# Patient Record
Sex: Female | Born: 1995
Health system: Southern US, Community
[De-identification: ages and names within clinical notes are randomized; demographics above are authoritative.]

## PROBLEM LIST (undated history)

## (undated) DIAGNOSIS — O09299 Supervision of pregnancy with other poor reproductive or obstetric history, unspecified trimester: Secondary | ICD-10-CM

## (undated) DIAGNOSIS — D649 Anemia, unspecified: Secondary | ICD-10-CM

## (undated) DIAGNOSIS — F419 Anxiety disorder, unspecified: Secondary | ICD-10-CM

## (undated) DIAGNOSIS — N390 Urinary tract infection, site not specified: Secondary | ICD-10-CM

## (undated) DIAGNOSIS — F99 Mental disorder, not otherwise specified: Secondary | ICD-10-CM

## (undated) DIAGNOSIS — F32A Depression, unspecified: Secondary | ICD-10-CM

## (undated) DIAGNOSIS — O24419 Gestational diabetes mellitus in pregnancy, unspecified control: Secondary | ICD-10-CM

## (undated) DIAGNOSIS — E119 Type 2 diabetes mellitus without complications: Secondary | ICD-10-CM

## (undated) HISTORY — DX: Supervision of pregnancy with other poor reproductive or obstetric history, unspecified trimester: O09.299

## (undated) HISTORY — DX: Gestational diabetes mellitus in pregnancy, unspecified control: O24.419

## (undated) HISTORY — PX: MOUTH SURGERY: SHX715

## (undated) HISTORY — DX: Mental disorder, not otherwise specified: F99

---

## 2016-10-03 ENCOUNTER — Ambulatory Visit
Admission: RE | Admit: 2016-10-03 | Discharge: 2016-10-03 | Disposition: A | Discharge status: Home / Self Care | Payer: 59 | Source: Ambulatory Visit | Attending: Family Medicine | Admitting: Family Medicine

## 2016-10-03 ENCOUNTER — Other Ambulatory Visit: Payer: Self-pay | Admitting: Family Medicine

## 2016-10-03 DIAGNOSIS — R7611 Nonspecific reaction to tuberculin skin test without active tuberculosis: Secondary | ICD-10-CM

## 2017-03-13 ENCOUNTER — Other Ambulatory Visit: Payer: Self-pay

## 2017-03-13 ENCOUNTER — Ambulatory Visit (HOSPITAL_COMMUNITY)
Admission: EM | Admit: 2017-03-13 | Discharge: 2017-03-13 | Disposition: A | Discharge status: Home / Self Care | Payer: 59 | Attending: Urgent Care | Admitting: Urgent Care

## 2017-03-13 ENCOUNTER — Encounter (HOSPITAL_COMMUNITY): Payer: Self-pay | Admitting: Emergency Medicine

## 2017-03-13 DIAGNOSIS — J0100 Acute maxillary sinusitis, unspecified: Secondary | ICD-10-CM | POA: Diagnosis not present

## 2017-03-13 DIAGNOSIS — R059 Cough, unspecified: Secondary | ICD-10-CM

## 2017-03-13 DIAGNOSIS — R05 Cough: Secondary | ICD-10-CM

## 2017-03-13 DIAGNOSIS — J029 Acute pharyngitis, unspecified: Secondary | ICD-10-CM

## 2017-03-13 MED ORDER — AMOXICILLIN 500 MG PO CAPS: 500.0000 mg | ORAL_CAPSULE | Freq: Three times a day (TID) | ORAL | 0 refills | Status: DC

## 2017-03-13 MED ORDER — CETIRIZINE HCL 10 MG PO TABS: 10.0000 mg | ORAL_TABLET | Freq: Every day | ORAL | 11 refills | Status: DC

## 2017-03-13 MED ORDER — BENZONATATE 100 MG PO CAPS: 100.0000 mg | ORAL_CAPSULE | Freq: Three times a day (TID) | ORAL | 0 refills | Status: DC | PRN

## 2017-03-13 MED ORDER — HYDROCODONE-HOMATROPINE 5-1.5 MG/5ML PO SYRP: 5.0000 mL | ORAL_SOLUTION | Freq: Every evening | ORAL | 0 refills | Status: DC | PRN

## 2017-03-13 MED ORDER — PSEUDOEPHEDRINE HCL ER 120 MG PO TB12: 120.0000 mg | ORAL_TABLET | Freq: Two times a day (BID) | ORAL | 3 refills | Status: DC

## 2017-03-13 NOTE — ED Provider Notes (Signed)
MRN: [id number] DOB: 25-May-1995  Subjective:   Donna Mcknight is a 21 y.o. female presenting for 3 week history of productive cough without hemoptysis that elicits shob, mild wheezing. Has also had sore throat, sinus congestion, now having right sided facial pressure, throbbing behind her right eye. Denies fever, chest pain, ear pain, ear drainage, chest pain, n/v, abdominal pain, rashes. Has been using DayQuil and NyQuil, otc antihistamine with minimal relief. Denies history of asthma, denies smoking cigarettes.  Donna Mcknight is not currently taking any medications and has No Known Allergies. [name] denies past medical history. She  has a past surgical history that includes Mouth surgery.  Objective:   Vitals: BP 113/74 (BP Location: Right Arm)   Pulse 73   Temp 99 F (37.2 C) (Oral)   Resp 20   LMP 02/11/2017   SpO2 96%   Physical Exam  Constitutional: She is oriented to person, place, and time. She appears well-developed and well-nourished.  HENT:  TM's intact bilaterally, no effusions or erythema. Nasal turbinates pink and moist, nasal passages patent. Mild right sided sinus tenderness. Oropharynx clear, mucous membranes moist.  Eyes: Right eye exhibits no discharge. Left eye exhibits no discharge. Right conjunctiva is injected.  Neck: Normal range of motion. Neck supple.  Cardiovascular: Normal rate, regular rhythm and intact distal pulses. Exam reveals no gallop and no friction rub.  No murmur heard. Pulmonary/Chest: No respiratory distress. She has no wheezes. She has no rales.  Lymphadenopathy:    She has no cervical adenopathy.  Neurological: She is alert and oriented to person, place, and time.  Skin: Skin is warm and dry.   Assessment and Plan :   Acute non-recurrent maxillary sinusitis  Sore throat  Cough  Will cover for sinusitis with amoxicillin. Cough suppression medications recommended otherwise. Use Zyrtec and Sudafed to address allergy component given that  patient just moved from [place] to [place] in the past 6 months and has felt more congestion, sneezing since being here. Return-to-clinic precautions discussed, patient verbalized understanding.   Wallis Bamberg, PA-C [facility] [phone] 03/13/2017 4:09 PM   Wallis Bamberg, PA-C 03/13/17 1630

## 2017-03-13 NOTE — Discharge Instructions (Signed)
For sore throat try using a honey-based tea. Use 3 teaspoons of honey with juice squeezed from half lemon. Place shaved pieces of ginger into 1/2-1 cup of water and warm over stove top. Then mix the ingredients and repeat every 4 hours as needed. 

## 2017-03-13 NOTE — ED Triage Notes (Signed)
Cold symptoms first noticed 3 weeks ago, then went away for 3-4 days.  Now symptoms have returned.  Patient complains of cough, throat soreness.  Denies pain.  Pain behind right eye started today.

## 2018-05-05 IMAGING — CR DG CHEST 2V
2 series · 2 of 2 positions shown · non-contrast
Comparison: None.

CLINICAL DATA: Pos TB blood test / no chest complaints / non smoker
/ no chance PG / jdh 315

EXAM:
CHEST  2 VIEW

[w chest pa]
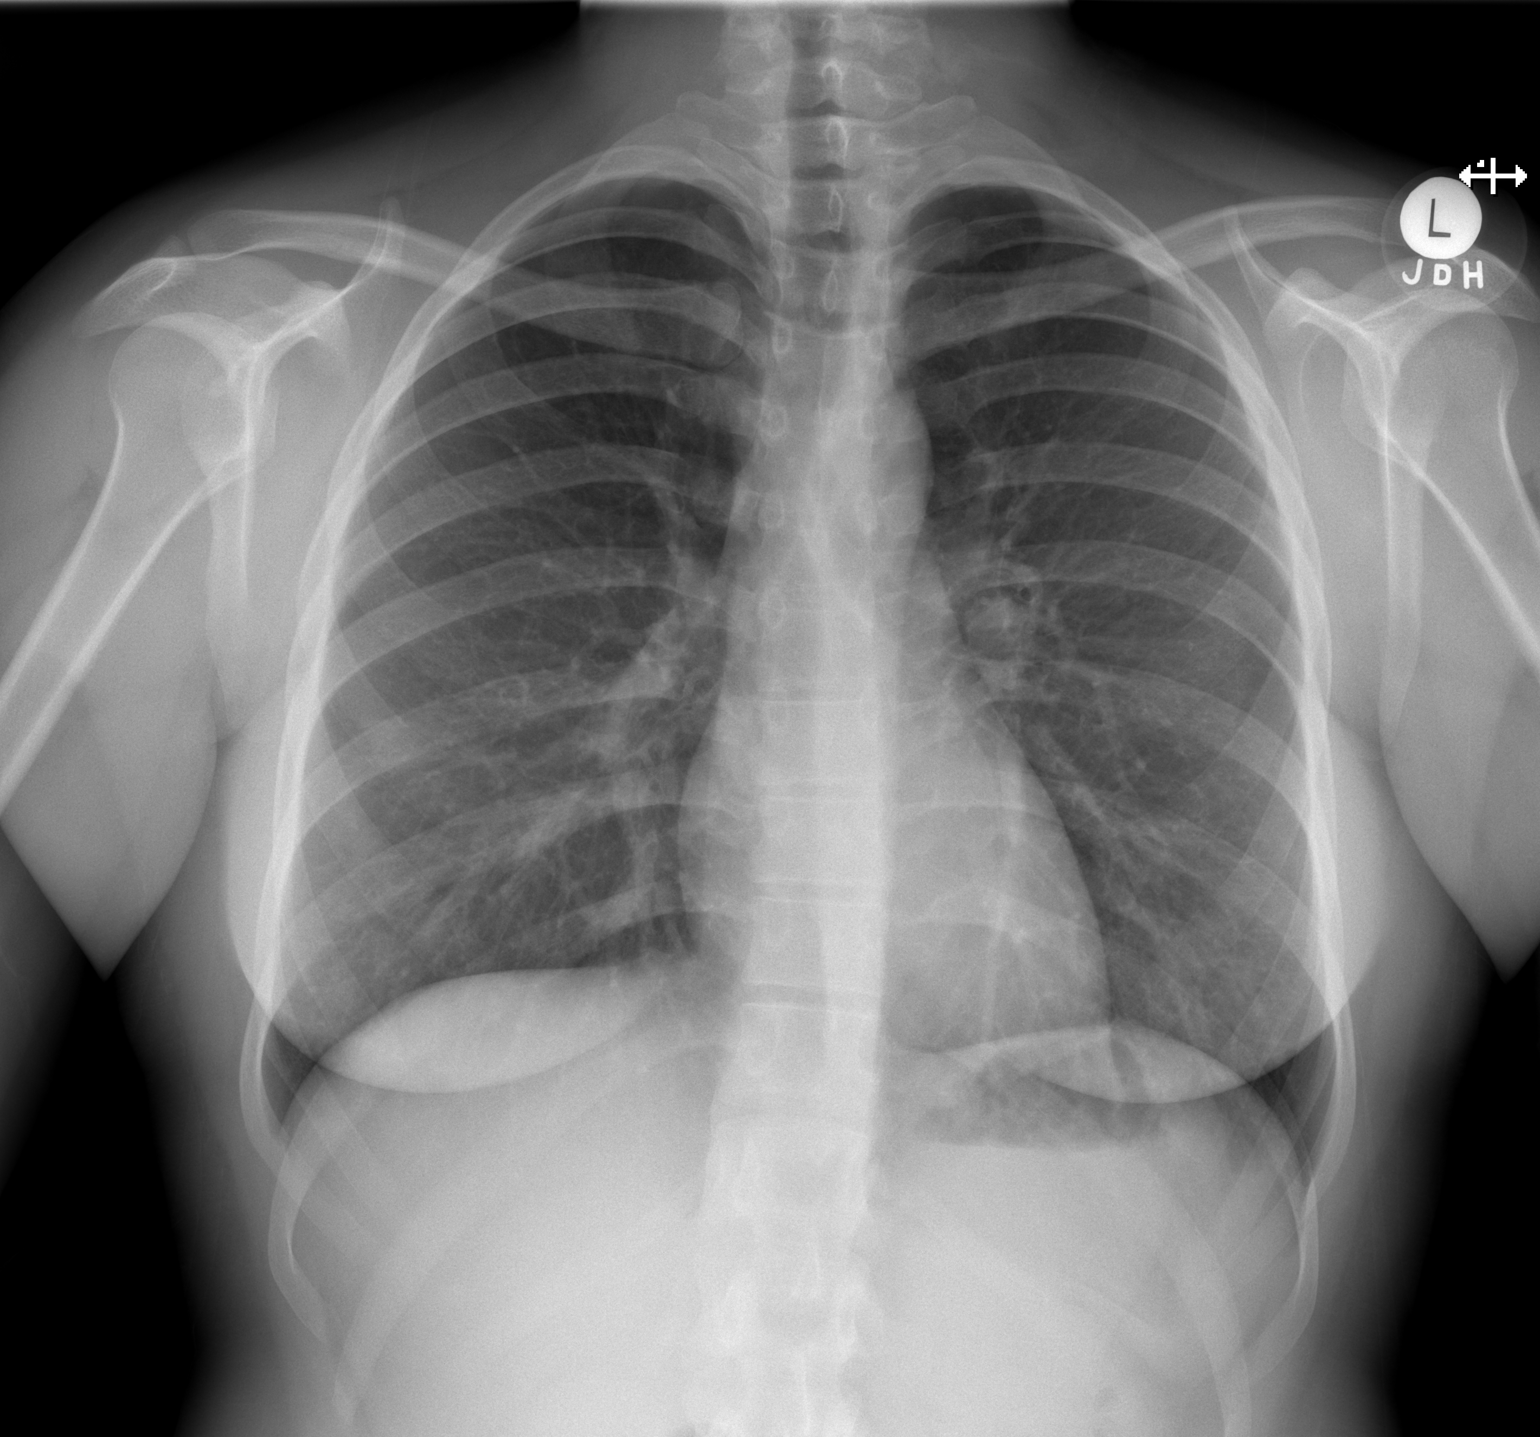

[w chest lat]
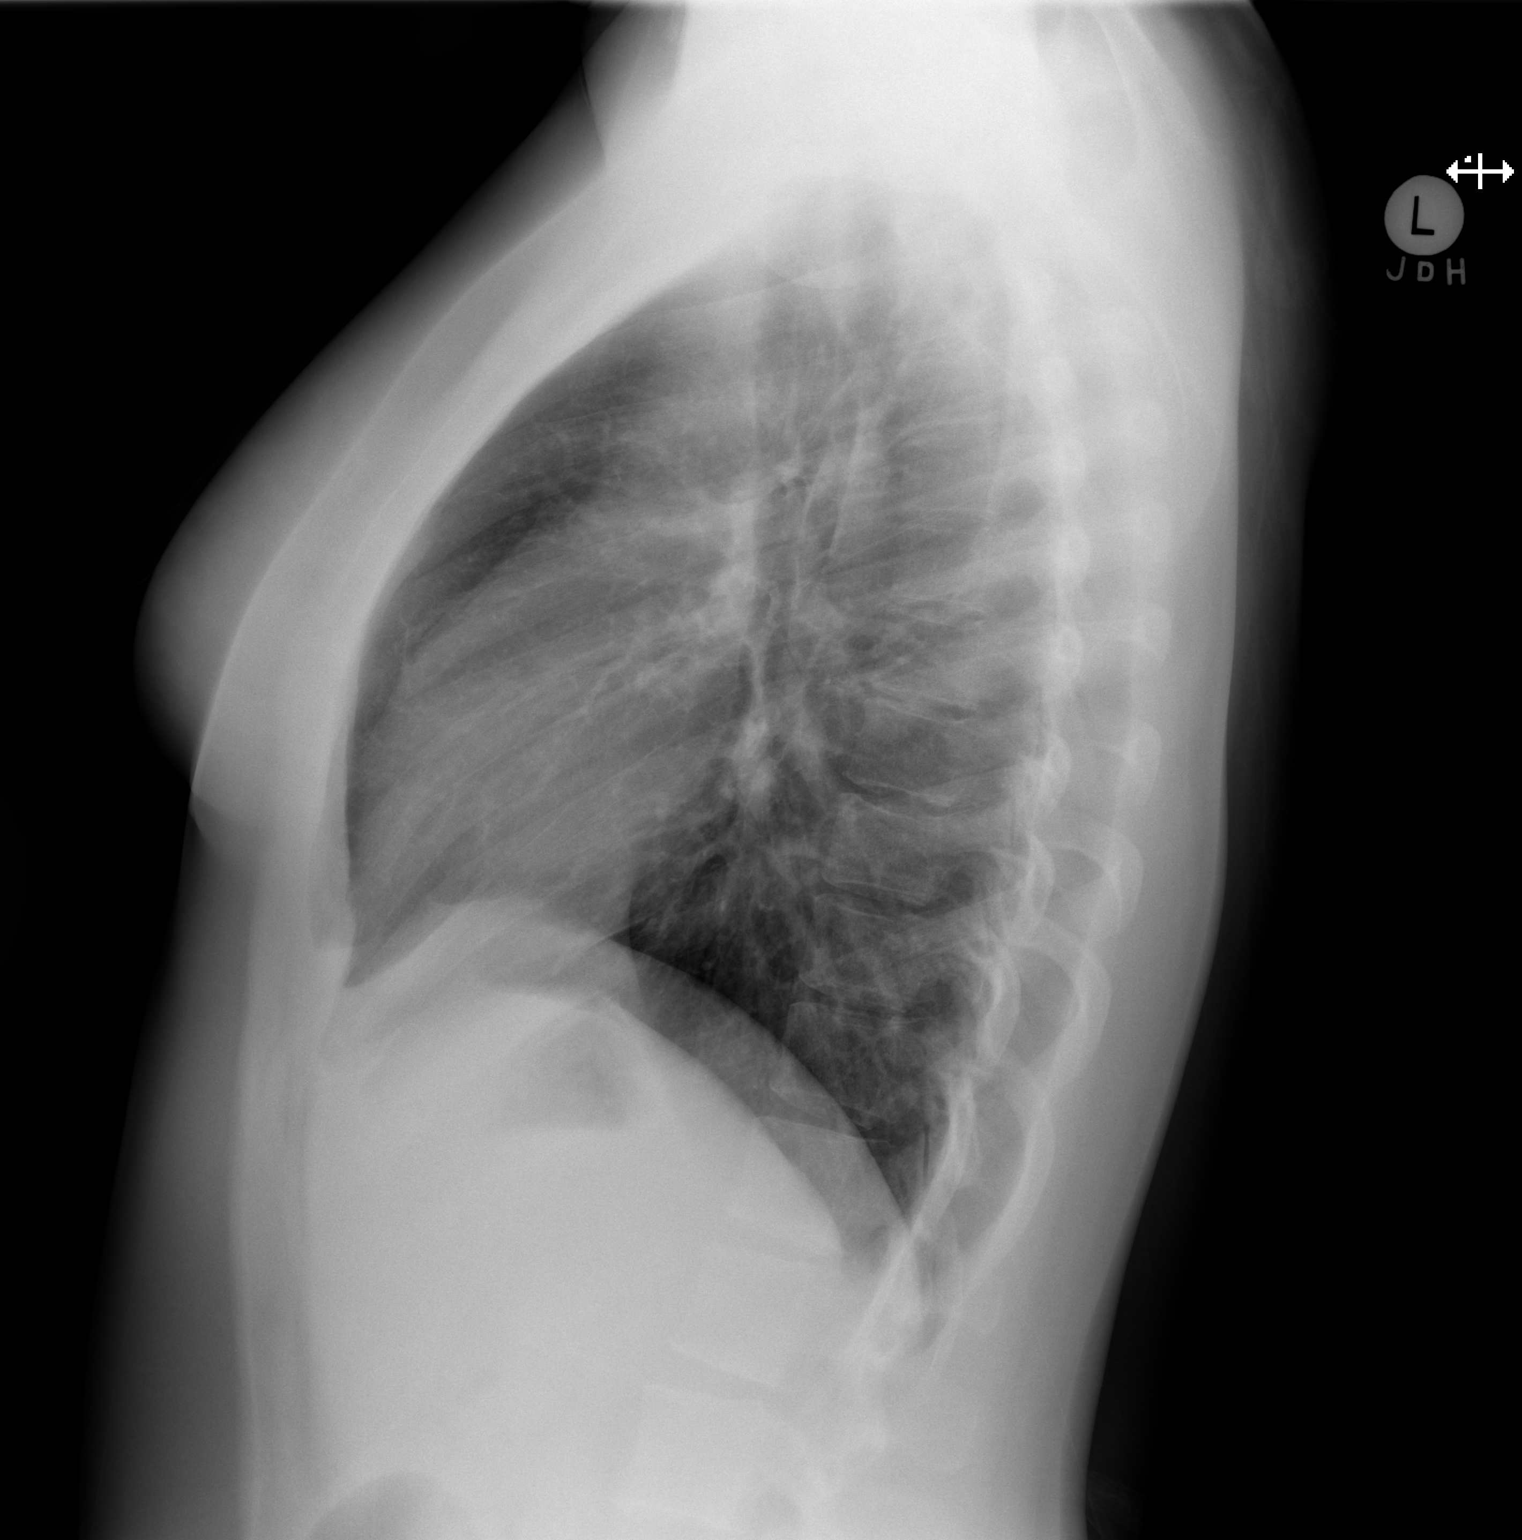

[2 of 2 positions shown; findings below may reference images not displayed]

FINDINGS: The heart size and mediastinal contours are within normal limits.
Both lungs are clear. No pleural effusion.

Probable old fracture of the left posterior third rib. No acute or
suspicious osseous finding.
IMPRESSION: No active cardiopulmonary disease.  No radiographic evidence of TB.

## 2019-08-06 DIAGNOSIS — N920 Excessive and frequent menstruation with regular cycle: Secondary | ICD-10-CM | POA: Diagnosis not present

## 2019-08-06 DIAGNOSIS — Z6823 Body mass index (BMI) 23.0-23.9, adult: Secondary | ICD-10-CM | POA: Diagnosis not present

## 2019-08-06 DIAGNOSIS — Z01419 Encounter for gynecological examination (general) (routine) without abnormal findings: Secondary | ICD-10-CM | POA: Diagnosis not present

## 2019-08-06 DIAGNOSIS — F520 Hypoactive sexual desire disorder: Secondary | ICD-10-CM | POA: Diagnosis not present

## 2019-08-06 DIAGNOSIS — Z3202 Encounter for pregnancy test, result negative: Secondary | ICD-10-CM | POA: Diagnosis not present

## 2019-12-19 ENCOUNTER — Other Ambulatory Visit: Payer: Self-pay

## 2019-12-19 ENCOUNTER — Ambulatory Visit
Admission: EM | Admit: 2019-12-19 | Discharge: 2019-12-19 | Disposition: A | Discharge status: Home / Self Care | Payer: BC Managed Care – PPO

## 2019-12-19 DIAGNOSIS — R0789 Other chest pain: Secondary | ICD-10-CM | POA: Diagnosis not present

## 2019-12-19 NOTE — Discharge Instructions (Signed)
EKG normal Rest, drink plenty of fluids Follow up if not improving or worsening

## 2019-12-19 NOTE — ED Provider Notes (Signed)
[facility]    CSN: [id number] Arrival date & time: 12/19/19  1311      History   Chief Complaint Chief Complaint  Patient presents with  . Dizziness  . Chest Pain    HPI Donna Mcknight is a 24 y.o. female presenting today for evaluation of chest pain.  Patient reports she had 3 brief episodes of chest pain earlier today.  Pain with central reports is a pressure sensation.  Symptoms resolved.  Has not had chest pain in the past 2 hours.  Last episode did have some associated dizziness which she describes as lightheadedness and spinning and jaw pain.  Denies any current.  Had brief sensation of feeling her throat closing up but this is also resolved.  She denies any foods medicines or exposures.  Denies history of any heart problems, denies any history of hypertension, diabetes.  Denies any family history of death from MI at early age.  Denies any tobacco use.  Denies any recreational drug use/cocaine.  Denies prior DVT/PE.  Denies leg pain or leg swelling.  Using Nexplanon for birth control.  She denies associated abdominal pain nausea or vomiting.  Did have some slight back pain.  Does report history of anxiety, but denies any increase in stress or anxiousness of recently.  Denies any relation to eating.  Denies sore throat, recent URI or cough/respiratory symptoms.  HPI  No past medical history on file.  There are no problems to display for this patient.   Past Surgical History:  Procedure Laterality Date  . MOUTH SURGERY      OB History   No obstetric history on file.      Home Medications    Prior to Admission medications   Medication Sig Start Date End Date Taking? Authorizing Provider  cetirizine (ZYRTEC ALLERGY) 10 MG tablet Take 1 tablet (10 mg total) by mouth daily. 03/13/17   Wallis Bamberg, PA-C    Family History Family History  Problem Relation Age of Onset  . Hypertension Mother   . Cirrhosis Father     Social History Social History    Tobacco Use  . Smoking status: Never Smoker  Substance Use Topics  . Alcohol use: Yes  . Drug use: No     Allergies   Patient has no known allergies.   Review of Systems Review of Systems  Constitutional: Negative for activity change, appetite change, chills, fatigue and fever.  HENT: Negative for congestion, ear pain, rhinorrhea, sinus pressure, sore throat and trouble swallowing.   Eyes: Negative for discharge and redness.  Respiratory: Negative for cough, chest tightness and shortness of breath.   Cardiovascular: Positive for chest pain. Negative for leg swelling.  Gastrointestinal: Negative for abdominal pain, diarrhea, nausea and vomiting.  Musculoskeletal: Negative for myalgias.  Skin: Negative for rash.  Neurological: Negative for dizziness, light-headedness and headaches.     Physical Exam Triage Vital Signs ED Triage Vitals  Enc Vitals Group     BP 12/19/19 1327 122/82     Pulse Rate 12/19/19 1327 90     Resp 12/19/19 1327 16     Temp 12/19/19 1327 98.7 F (37.1 C)     Temp Source 12/19/19 1327 Oral     SpO2 12/19/19 1327 98 %     Weight --      Height --      Head Circumference --      Peak Flow --      Pain Score 12/19/19 1446 0  Pain Loc --      Pain Edu? --      Excl. in GC? --    No data found.  Updated Vital Signs BP 122/82 (BP Location: Right Arm)   Pulse 90   Temp 98.7 F (37.1 C) (Oral)   Resp 16   SpO2 98%   Visual Acuity Right Eye Distance:   Left Eye Distance:   Bilateral Distance:    Right Eye Near:   Left Eye Near:    Bilateral Near:     Physical Exam Vitals and nursing note reviewed.  Constitutional:      Appearance: She is well-developed.     Comments: No acute distress  HENT:     Head: Normocephalic and atraumatic.     Ears:     Comments: Bilateral ears without tenderness to palpation of external auricle, tragus and mastoid, EAC's without erythema or swelling, TM's with good bony landmarks and cone of light. Non  erythematous.     Nose: Nose normal.  Eyes:     Extraocular Movements: Extraocular movements intact.     Conjunctiva/sclera: Conjunctivae normal.     Pupils: Pupils are equal, round, and reactive to light.  Cardiovascular:     Rate and Rhythm: Normal rate.  Pulmonary:     Effort: Pulmonary effort is normal. No respiratory distress.     Comments: Breathing comfortably at rest, CTABL, no wheezing, rales or other adventitious sounds auscultated Abdominal:     General: There is no distension.  Musculoskeletal:        General: Normal range of motion.     Cervical back: Neck supple.  Skin:    General: Skin is warm and dry.  Neurological:     General: No focal deficit present.     Mental Status: She is alert and oriented to person, place, and time. Mental status is at baseline.     Cranial Nerves: No cranial nerve deficit.     Motor: No weakness.      UC Treatments / Results  Labs (all labs ordered are listed, but only abnormal results are displayed) Labs Reviewed - No data to display  EKG   Radiology No results found.  Procedures Procedures (including critical care time)  Medications Ordered in UC Medications - No data to display  Initial Impression / Assessment and Plan / UC Course  I have reviewed the triage vital signs and the nursing notes.  Pertinent labs & imaging results that were available during my care of the patient were reviewed by me and considered in my medical decision making (see chart for details).     EKG normal sinus rhythm, no acute signs of ischemia or infarction.  Exam unremarkable, vital signs stable.  Negative risk factors for cardiac etiology.  Low suspicion of PE.  Currently asymptomatic and back to baseline.  Recommending close monitoring, possible anxiety.  Seems less likely pulmonary or GI related.  Discussed strict return precautions. Patient verbalized understanding and is agreeable with plan.  Final Clinical Impressions(s) / UC  Diagnoses   Final diagnoses:  Atypical chest pain     Discharge Instructions     EKG normal Rest, drink plenty of fluids Follow up if not improving or worsening   ED Prescriptions    None     PDMP not reviewed this encounter.   Sharyon Cable Homer C, PA-C 12/19/19 1626

## 2019-12-19 NOTE — ED Triage Notes (Signed)
Pt presents with c/o sudden onset of chest pain that resolved, began at 09:30 this morning

## 2020-02-10 ENCOUNTER — Encounter: Payer: Self-pay | Admitting: Women's Health

## 2020-02-10 ENCOUNTER — Ambulatory Visit (INDEPENDENT_AMBULATORY_CARE_PROVIDER_SITE_OTHER): Payer: BC Managed Care – PPO | Admitting: Women's Health

## 2020-02-10 ENCOUNTER — Other Ambulatory Visit: Payer: Self-pay

## 2020-02-10 VITALS — BP 121/78 | HR 71 | Ht 65.5 in | Wt 165.8 lb

## 2020-02-10 DIAGNOSIS — Z3046 Encounter for surveillance of implantable subdermal contraceptive: Secondary | ICD-10-CM

## 2020-02-10 NOTE — Patient Instructions (Signed)
Keep the area clean and dry.  You can remove the big bandage in 24 hours, and the small steri-strip bandage in 3-5 days.    Start prenatal vitamins

## 2020-02-10 NOTE — Progress Notes (Signed)
NEXPLANON REMOVAL Patient name: Donna Mcknight MRN [id number]  Date of birth: 1995/11/09 Subjective Findings:   Donna Mcknight is a 24 y.o. G0P0000 Caucasian female being seen today for removal of a Nexplanon. Her Nexplanon was placed Feb 2019 in [place].  She desires removal because she wants to get pregnant soon. Signed copy of informed consent in chart.   Patient's last menstrual period was 01/17/2020 (exact date). Last pap within last few years at [facility]- will get records The planned method of family planning is has phexxi (went to website and got it sent to her), then will stop when ready to try to conceive Depression screen PHQ 2/9 02/10/2020  Decreased Interest 1  Down, Depressed, Hopeless 1  PHQ - 2 Score 2  Altered sleeping 0  Tired, decreased energy 0  Change in appetite 1  Feeling bad or failure about yourself  0  Trouble concentrating 0  Moving slowly or fidgety/restless 0  Suicidal thoughts 0  PHQ-9 Score 3    Pertinent History Reviewed:   Reviewed past medical,surgical, social, obstetrical and family history.  Reviewed problem list, medications and allergies. Objective Findings & Procedure:    Vitals:   02/10/20 0850  BP: 121/78  Pulse: 71  Weight: 165 lb 12.8 oz (75.2 kg)  Height: 5' 5.5" (1.664 m)  Body mass index is 27.17 kg/m.  No results found for this or any previous visit (from the past 24 hour(s)).   Time out was performed.  Nexplanon site identified.  Area prepped in usual sterile fashon. One cc of 2% lidocaine was used to anesthetize the area at the distal end of the implant. A small stab incision was made right beside the implant on the distal portion.  The Nexplanon rod was grasped using hemostats and removed without difficulty.  There was less than 3 cc blood loss. There were no complications.  Steri-strips were applied over the small incision and a pressure bandage was applied.  The patient tolerated the procedure  well. Assessment & Plan:   1) Nexplanon removal She was instructed to keep the area clean and dry, remove pressure bandage in 24 hours, and keep insertion site covered with the steri-strip for 3-5 days.   Follow-up PRN problems.  2) Desires pregnancy soon> start pnv, let us know when gets +HPT  No orders of the defined types were placed in this encounter.   Follow-up: Return for prn; sign release for pap records from [facility].  Cheral Marker CNM, [facility] 02/10/2020 9:19 AM

## 2020-02-18 ENCOUNTER — Encounter: Payer: Self-pay | Admitting: *Deleted

## 2020-02-19 ENCOUNTER — Encounter: Payer: Self-pay | Admitting: Women's Health

## 2020-02-19 DIAGNOSIS — Z124 Encounter for screening for malignant neoplasm of cervix: Secondary | ICD-10-CM | POA: Insufficient documentation

## 2020-03-17 ENCOUNTER — Telehealth: Payer: Self-pay | Admitting: Women's Health

## 2020-03-17 NOTE — Telephone Encounter (Addendum)
Pt is out of Phexxi and don't want to try getting pregnant until Feb. Pt would like Phexxi again if possible. Thanks!! JSY

## 2020-03-17 NOTE — Telephone Encounter (Signed)
Patient called stating that Selena Batten and her discussed her being placed on a new birth control and patient is needs a prescription for this new BC. Please contact pt

## 2020-03-18 ENCOUNTER — Other Ambulatory Visit: Payer: Self-pay | Admitting: Women's Health

## 2020-03-18 MED ORDER — PHEXXI 1.8-1-0.4 % VA GEL: VAGINAL | 11 refills | Status: DC

## 2020-04-29 ENCOUNTER — Other Ambulatory Visit (HOSPITAL_COMMUNITY)
Admission: RE | Admit: 2020-04-29 | Discharge: 2020-04-29 | Disposition: A | Discharge status: Home / Self Care | Payer: BC Managed Care – PPO | Source: Ambulatory Visit | Attending: Obstetrics & Gynecology | Admitting: Obstetrics & Gynecology

## 2020-04-29 ENCOUNTER — Telehealth: Payer: Self-pay | Admitting: Adult Health

## 2020-04-29 ENCOUNTER — Other Ambulatory Visit: Payer: Self-pay

## 2020-04-29 ENCOUNTER — Other Ambulatory Visit (INDEPENDENT_AMBULATORY_CARE_PROVIDER_SITE_OTHER): Payer: BC Managed Care – PPO | Admitting: *Deleted

## 2020-04-29 DIAGNOSIS — Z3202 Encounter for pregnancy test, result negative: Secondary | ICD-10-CM | POA: Diagnosis not present

## 2020-04-29 DIAGNOSIS — N898 Other specified noninflammatory disorders of vagina: Secondary | ICD-10-CM

## 2020-04-29 LAB — POCT URINE PREGNANCY: Preg Test, Ur: NEGATIVE

## 2020-04-29 NOTE — Telephone Encounter (Signed)
Pt has yeast infection, can we call in something?  Please advise & call pt    [place]

## 2020-04-29 NOTE — Telephone Encounter (Signed)
Left message letting pt know she can try OTC Monistat for yeast. If she has tried that already, just call office back and let us know. JSY

## 2020-04-29 NOTE — Progress Notes (Signed)
   NURSE VISIT- VAGINITIS/STD/POC  SUBJECTIVE:  Donna Mcknight is a 25 y.o. G0P0000 GYN patient female here for a vaginal swab for vaginitis screening, STD screen.  She reports the following symptoms: vaginal itching and discharge for 2-3 days. Denies abnormal vaginal bleeding, significant pelvic pain, fever, or UTI symptoms.  OBJECTIVE:  There were no vitals taken for this visit.  Appears well, in no apparent distress  ASSESSMENT: Vaginal swab for vaginitis screening  PLAN: Self-collected vaginal probe for Gonorrhea, Chlamydia, Trichomonas, Bacterial Vaginosis, Yeast sent to lab. Pt also requested a pregnancy test. Has not done a home pregnancy test. In office pregnancy test was negative. Last period was 04/03/20. I advised if period don't start, can do a home pregnancy test. Pt reports nausea. I gave instructions on Unisom and Vit B6.  Treatment: to be determined once results are received Follow-up as needed if symptoms persist/worsen, or new symptoms develop  Malachy Mood  04/29/2020 4:39 PM

## 2020-04-30 NOTE — Progress Notes (Signed)
Chart reviewed for nurse visit. Agree with plan of care.  Adline Potter, NP 04/30/2020 9:54 AM

## 2020-05-01 LAB — CERVICOVAGINAL ANCILLARY ONLY
Bacterial Vaginitis (gardnerella): POSITIVE — AB
Candida Glabrata: NEGATIVE
Candida Vaginitis: POSITIVE — AB
Chlamydia: NEGATIVE
Comment: NEGATIVE
Comment: NEGATIVE
Comment: NEGATIVE
Comment: NEGATIVE
Comment: NEGATIVE
Comment: NORMAL
Neisseria Gonorrhea: NEGATIVE
Trichomonas: NEGATIVE

## 2020-05-04 ENCOUNTER — Other Ambulatory Visit: Payer: Self-pay | Admitting: Adult Health

## 2020-05-04 MED ORDER — FLUCONAZOLE 150 MG PO TABS: ORAL_TABLET | ORAL | 1 refills | Status: DC

## 2020-05-04 MED ORDER — METRONIDAZOLE 500 MG PO TABS: 500.0000 mg | ORAL_TABLET | Freq: Two times a day (BID) | ORAL | 0 refills | Status: DC

## 2020-05-04 NOTE — Progress Notes (Signed)
Had +BV and yeast on vaginal swab called in Rx for flagyl and diflucan

## 2020-07-13 DIAGNOSIS — M25572 Pain in left ankle and joints of left foot: Secondary | ICD-10-CM | POA: Diagnosis not present

## 2020-08-06 ENCOUNTER — Other Ambulatory Visit: Payer: BC Managed Care – PPO | Admitting: Women's Health

## 2020-08-17 ENCOUNTER — Other Ambulatory Visit (HOSPITAL_COMMUNITY)
Admission: RE | Admit: 2020-08-17 | Discharge: 2020-08-17 | Disposition: A | Discharge status: Home / Self Care | Payer: BC Managed Care – PPO | Source: Ambulatory Visit | Attending: Obstetrics & Gynecology | Admitting: Obstetrics & Gynecology

## 2020-08-17 ENCOUNTER — Other Ambulatory Visit: Payer: Self-pay

## 2020-08-17 ENCOUNTER — Ambulatory Visit (INDEPENDENT_AMBULATORY_CARE_PROVIDER_SITE_OTHER): Payer: BC Managed Care – PPO | Admitting: Women's Health

## 2020-08-17 ENCOUNTER — Encounter: Payer: Self-pay | Admitting: Women's Health

## 2020-08-17 VITALS — BP 130/80 | HR 65 | Ht 65.5 in | Wt 172.0 lb

## 2020-08-17 DIAGNOSIS — Z01419 Encounter for gynecological examination (general) (routine) without abnormal findings: Secondary | ICD-10-CM | POA: Diagnosis not present

## 2020-08-17 DIAGNOSIS — Z3491 Encounter for supervision of normal pregnancy, unspecified, first trimester: Secondary | ICD-10-CM

## 2020-08-17 DIAGNOSIS — Z3A01 Less than 8 weeks gestation of pregnancy: Secondary | ICD-10-CM

## 2020-08-17 DIAGNOSIS — N926 Irregular menstruation, unspecified: Secondary | ICD-10-CM | POA: Diagnosis not present

## 2020-08-17 LAB — POCT URINE PREGNANCY: Preg Test, Ur: POSITIVE — AB

## 2020-08-17 NOTE — Patient Instructions (Addendum)
Constipation  Drink plenty of fluid, preferably water, throughout the day  Eat foods high in fiber such as fruits, vegetables, and grains  Exercise, such as walking, is a good way to keep your bowels regular  Drink warm fluids, especially warm prune juice, or decaf coffee  Eat a 1/2 cup of real oatmeal (not instant), 1/2 cup applesauce, and 1/2-1 cup warm prune juice every day  If needed, you may take Colace (docusate sodium) stool softener once or twice a day to help keep the stool soft. If you are pregnant, wait until you are out of your first trimester (12-14 weeks of pregnancy)  If you still are having problems with constipation, you may take Miralax once daily as needed to help keep your bowels regular.  If you are pregnant, wait until you are out of your first trimester (12-14 weeks of pregnancy)    Primary Care Providers  Dr. Dwana Melena [place]) [phone]  [facility] [phone]  [facility]) Dr. Karilyn Cota [phone]  [facility]) [phone]  [facility] [place]) [phone]  [facility]) [phone]  [facility] [phone]  [facility] [phone]  Jiles Crocker, I greatly value your feedback.  If you receive a survey following your visit with [place] today, we appreciate you taking the time to fill it out.  Thanks, Joellyn Haff, CNM, WHNP-BC   [facility] ([address] [place], [place] [address]) Entrance C, located off of [address] Free 24/7 valet parking   Nausea & Vomiting  Have saltine crackers or pretzels by your bed and eat a few bites before you raise your head out of bed in the morning  Eat small frequent meals throughout the day instead of large meals  Drink plenty of fluids throughout the day to stay hydrated, just don't drink a lot of fluids with your meals.  This can make your stomach fill up  faster making you feel sick  Do not brush your teeth right after you eat  Products with real ginger are good for nausea, like ginger ale and ginger hard candy Make sure it says made with real ginger!  Sucking on sour candy like lemon heads is also good for nausea  If your prenatal vitamins make you nauseated, take them at night so you will sleep through the nausea  Sea Bands  If you feel like you need medicine for the nausea & vomiting please let us know  If you are unable to keep any fluids or food down please let us know   Constipation  Drink plenty of fluid, preferably water, throughout the day  Eat foods high in fiber such as fruits, vegetables, and grains  Exercise, such as walking, is a good way to keep your bowels regular  Drink warm fluids, especially warm prune juice, or decaf coffee  Eat a 1/2 cup of real oatmeal (not instant), 1/2 cup applesauce, and 1/2-1 cup warm prune juice every day  If needed, you may take Colace (docusate sodium) stool softener once or twice a day to help keep the stool soft.   If you still are having problems with constipation, you may take Miralax once daily as needed to help keep your bowels regular.   Home Blood Pressure Monitoring for Patients   Your provider has recommended that you check your blood pressure (BP) at least once a week at home. If you do not have a blood pressure  cuff at home, one will be provided for you. Contact your provider if you have not received your monitor within 1 week.   Helpful Tips for Accurate Home Blood Pressure Checks  . Don't smoke, exercise, or drink caffeine 30 minutes before checking your BP . Use the restroom before checking your BP (a full bladder can raise your pressure) . Relax in a comfortable upright chair . Feet on the ground . Left arm resting comfortably on a flat surface at the level of your heart . Legs uncrossed . Back supported . Sit quietly and don't talk . Place the cuff on your bare  arm . Adjust snuggly, so that only two fingertips can fit between your skin and the top of the cuff . Check 2 readings separated by at least one minute . Keep a log of your BP readings . For a visual, please reference this diagram: http://ccnc.care/bpdiagram  Provider Name: [facility]     Phone: [phone]  Zone 1: ALL CLEAR  Continue to monitor your symptoms:  . BP reading is less than 140 (top number) or less than 90 (bottom number)  . No right upper stomach pain . No headaches or seeing spots . No feeling nauseated or throwing up . No swelling in face and hands  Zone 2: CAUTION Call your doctor's office for any of the following:  . BP reading is greater than 140 (top number) or greater than 90 (bottom number)  . Stomach pain under your ribs in the middle or right side . Headaches or seeing spots . Feeling nauseated or throwing up . Swelling in face and hands  Zone 3: EMERGENCY  Seek immediate medical care if you have any of the following:  . BP reading is greater than160 (top number) or greater than 110 (bottom number) . Severe headaches not improving with Tylenol . Serious difficulty catching your breath . Any worsening symptoms from Zone 2    First Trimester of Pregnancy The first trimester of pregnancy is from week 1 until the end of week 12 (months 1 through 3). A week after a sperm fertilizes an egg, the egg will implant on the wall of the uterus. This embryo will begin to develop into a baby. Genes from you and your partner are forming the baby. The female genes determine whether the baby is a boy or a girl. At 6-8 weeks, the eyes and face are formed, and the heartbeat can be seen on ultrasound. At the end of 12 weeks, all the baby's organs are formed.  Now that you are pregnant, you will want to do everything you can to have a healthy baby. Two of the most important things are to get good prenatal care and to follow your health care provider's instructions. Prenatal  care is all the medical care you receive before the baby's birth. This care will help prevent, find, and treat any problems during the pregnancy and childbirth. BODY CHANGES Your body goes through many changes during pregnancy. The changes vary from woman to woman.   You may gain or lose a couple of pounds at first.  You may feel sick to your stomach (nauseous) and throw up (vomit). If the vomiting is uncontrollable, call your health care provider.  You may tire easily.  You may develop headaches that can be relieved by medicines approved by your health care provider.  You may urinate more often. Painful urination may mean you have a bladder infection.  You may develop heartburn as  a result of your pregnancy.  You may develop constipation because certain hormones are causing the muscles that push waste through your intestines to slow down.  You may develop hemorrhoids or swollen, bulging veins (varicose veins).  Your breasts may begin to grow larger and become tender. Your nipples may stick out more, and the tissue that surrounds them (areola) may become darker.  Your gums may bleed and may be sensitive to brushing and flossing.  Dark spots or blotches (chloasma, mask of pregnancy) may develop on your face. This will likely fade after the baby is born.  Your menstrual periods will stop.  You may have a loss of appetite.  You may develop cravings for certain kinds of food.  You may have changes in your emotions from day to day, such as being excited to be pregnant or being concerned that something may go wrong with the pregnancy and baby.  You may have more vivid and strange dreams.  You may have changes in your hair. These can include thickening of your hair, rapid growth, and changes in texture. Some women also have hair loss during or after pregnancy, or hair that feels dry or thin. Your hair will most likely return to normal after your baby is born. WHAT TO EXPECT AT YOUR  PRENATAL VISITS During a routine prenatal visit:  You will be weighed to make sure you and the baby are growing normally.  Your blood pressure will be taken.  Your abdomen will be measured to track your baby's growth.  The fetal heartbeat will be listened to starting around week 10 or 12 of your pregnancy.  Test results from any previous visits will be discussed. Your health care provider may ask you:  How you are feeling.  If you are feeling the baby move.  If you have had any abnormal symptoms, such as leaking fluid, bleeding, severe headaches, or abdominal cramping.  If you have any questions. Other tests that may be performed during your first trimester include:  Blood tests to find your blood type and to check for the presence of any previous infections. They will also be used to check for low iron levels (anemia) and Rh antibodies. Later in the pregnancy, blood tests for diabetes will be done along with other tests if problems develop.  Urine tests to check for infections, diabetes, or protein in the urine.  An ultrasound to confirm the proper growth and development of the baby.  An amniocentesis to check for possible genetic problems.  Fetal screens for spina bifida and Down syndrome.  You may need other tests to make sure you and the baby are doing well. HOME CARE INSTRUCTIONS  Medicines  Follow your health care provider's instructions regarding medicine use. Specific medicines may be either safe or unsafe to take during pregnancy.  Take your prenatal vitamins as directed.  If you develop constipation, try taking a stool softener if your health care provider approves. Diet  Eat regular, well-balanced meals. Choose a variety of foods, such as meat or vegetable-based protein, fish, milk and low-fat dairy products, vegetables, fruits, and whole grain breads and cereals. Your health care provider will help you determine the amount of weight gain that is right for  you.  Avoid raw meat and uncooked cheese. These carry germs that can cause birth defects in the baby.  Eating four or five small meals rather than three large meals a day may help relieve nausea and vomiting. If you start to feel nauseous, eating  a few soda crackers can be helpful. Drinking liquids between meals instead of during meals also seems to help nausea and vomiting.  If you develop constipation, eat more high-fiber foods, such as fresh vegetables or fruit and whole grains. Drink enough fluids to keep your urine clear or pale yellow. Activity and Exercise  Exercise only as directed by your health care provider. Exercising will help you:  Control your weight.  Stay in shape.  Be prepared for labor and delivery.  Experiencing pain or cramping in the lower abdomen or low back is a good sign that you should stop exercising. Check with your health care provider before continuing normal exercises.  Try to avoid standing for long periods of time. Move your legs often if you must stand in one place for a long time.  Avoid heavy lifting.  Wear low-heeled shoes, and practice good posture.  You may continue to have sex unless your health care provider directs you otherwise. Relief of Pain or Discomfort  Wear a good support bra for breast tenderness.    Take warm sitz baths to soothe any pain or discomfort caused by hemorrhoids. Use hemorrhoid cream if your health care provider approves.    Rest with your legs elevated if you have leg cramps or low back pain.  If you develop varicose veins in your legs, wear support hose. Elevate your feet for 15 minutes, 3-4 times a day. Limit salt in your diet. Prenatal Care  Schedule your prenatal visits by the twelfth week of pregnancy. They are usually scheduled monthly at first, then more often in the last 2 months before delivery.  Write down your questions. Take them to your prenatal visits.  Keep all your prenatal visits as directed by  your health care provider. Safety  Wear your seat belt at all times when driving.  Make a list of emergency phone numbers, including numbers for family, friends, the hospital, and police and fire departments. General Tips  Ask your health care provider for a referral to a local prenatal education class. Begin classes no later than at the beginning of month 6 of your pregnancy.  Ask for help if you have counseling or nutritional needs during pregnancy. Your health care provider can offer advice or refer you to specialists for help with various needs.  Do not use hot tubs, steam rooms, or saunas.  Do not douche or use tampons or scented sanitary pads.  Do not cross your legs for long periods of time.  Avoid cat litter boxes and soil used by cats. These carry germs that can cause birth defects in the baby and possibly loss of the fetus by miscarriage or stillbirth.  Avoid all smoking, herbs, alcohol, and medicines not prescribed by your health care provider. Chemicals in these affect the formation and growth of the baby.  Schedule a dentist appointment. At home, brush your teeth with a soft toothbrush and be gentle when you floss. SEEK MEDICAL CARE IF:   You have dizziness.  You have mild pelvic cramps, pelvic pressure, or nagging pain in the abdominal area.  You have persistent nausea, vomiting, or diarrhea.  You have a bad smelling vaginal discharge.  You have pain with urination.  You notice increased swelling in your face, hands, legs, or ankles. SEEK IMMEDIATE MEDICAL CARE IF:   You have a fever.  You are leaking fluid from your vagina.  You have spotting or bleeding from your vagina.  You have severe abdominal cramping or pain.  You have rapid weight gain or loss.  You vomit blood or material that looks like coffee grounds.  You are exposed to [place] measles and have never had them.  You are exposed to fifth disease or chickenpox.  You develop a severe  headache.  You have shortness of breath.  You have any kind of trauma, such as from a fall or a car accident. Document Released: 03/22/2001 Document Revised: 08/12/2013 Document Reviewed: 02/05/2013 [facility] Patient Information 2015 [place], [place]. This information is not intended to replace advice given to you by your health care provider. Make sure you discuss any questions you have with your health care provider.

## 2020-08-17 NOTE — Progress Notes (Signed)
WELL-WOMAN EXAMINATION Patient name: Donna Mcknight MRN [id number]  Date of birth: 01-01-1996 Chief Complaint:   Gynecologic Exam  History of Present Illness:   Donna Mcknight is a 25 y.o. G14P0000 Caucasian female being seen today for a routine well-woman exam.  Current complaints: started trying for pregnancy in Jan. Not taking pnv. Period is a few days late, had bright red bleeding w/ sex the other night. Denies abnormal discharge, itching/odor/irritation.  Some constipation.  Depression screen [facility] 2/9 08/17/2020 02/10/2020  Decreased Interest 0 1  Down, Depressed, Hopeless 0 1  PHQ - 2 Score 0 2  Altered sleeping 0 0  Tired, decreased energy 1 0  Change in appetite 2 1  Feeling bad or failure about yourself  0 0  Trouble concentrating 0 0  Moving slowly or fidgety/restless 0 0  Suicidal thoughts 0 0  PHQ-9 Score 3 3     PCP: none      does not desire labs Patient's last menstrual period was 07/12/2020 (exact date). The current method of family planning is none.  Last pap 05/17/17. Results were: NILM w/ HRHPV not done. H/O abnormal pap: no Last mammogram: never. Results were: N/A. Family h/o breast cancer: no Last colonoscopy: never. Results were: N/A. Family h/o colorectal cancer: no Review of Systems:   Pertinent items are noted in HPI Denies any headaches, blurred vision, fatigue, shortness of breath, chest pain, abdominal pain, abnormal vaginal discharge/itching/odor/irritation, problems with periods, bowel movements, urination, or intercourse unless otherwise stated above. Pertinent History Reviewed:  Reviewed past medical,surgical, social and family history.  Reviewed problem list, medications and allergies. Physical Assessment:   Vitals:   08/17/20 0933  BP: 130/80  Pulse: 65  Weight: 172 lb (78 kg)  Height: 5' 5.5" (1.664 m)  Body mass index is 28.19 kg/m.        Physical Examination:   General appearance - well appearing, and in no  distress  Mental status - alert, oriented to person, place, and time  Psych:  She has a normal mood and affect  Skin - warm and dry, normal color, no suspicious lesions noted  Chest - effort normal, all lung fields clear to auscultation bilaterally  Heart - normal rate and regular rhythm  Neck:  midline trachea, no thyromegaly or nodules  Breasts - breasts appear normal, no suspicious masses, no skin or nipple changes or  axillary nodes  Abdomen - soft, nontender, nondistended, no masses or organomegaly  Pelvic - VULVA: normal appearing vulva with no masses, tenderness or lesions  VAGINA: normal appearing vagina with normal color and discharge, no lesions  CERVIX: normal appearing cervix without discharge or lesions, no CMT, friable w/ pap brush  Thin prep pap is done w/ HR HPV cotesting  UTERUS: uterus is felt to be normal size, shape, consistency and nontender   ADNEXA: No adnexal masses or tenderness noted.  Extremities:  No swelling or varicosities noted  Chaperone: Faith Rogue    Results for orders placed or performed in visit on 08/17/20 (from the past 24 hour(s))  POCT urine pregnancy   Collection Time: 08/17/20 10:08 AM  Result Value Ref Range   Preg Test, Ur Positive (A) Negative    Assessment & Plan:  1) Well-Woman Exam  2) Constipation> gave printed prevention/relief measures   3) [redacted]w[redacted]d pregnant> start pnv, schedule dating u/s in 2-3wks  Labs/procedures today: pap, UPT  Mammogram: @ 26yo, or sooner if problems Colonoscopy: @ 26yo, or sooner if problems  Orders Placed This Encounter  Procedures  . POCT urine pregnancy    Meds: No orders of the defined types were placed in this encounter.   Follow-up: Return for 2-3wks for pregnancy dating u/s.  Cheral Marker CNM, [facility] 08/17/2020 10:12 AM

## 2020-08-17 NOTE — Addendum Note (Signed)
Addended by: Cheral Marker on: 08/17/2020 10:53 AM   Modules accepted: Orders

## 2020-08-19 LAB — CYTOLOGY - PAP
Chlamydia: NEGATIVE
Comment: NEGATIVE
Comment: NEGATIVE
Comment: NORMAL
Diagnosis: NEGATIVE
High risk HPV: NEGATIVE
Neisseria Gonorrhea: NEGATIVE

## 2020-08-25 ENCOUNTER — Other Ambulatory Visit: Payer: Self-pay | Admitting: Women's Health

## 2020-08-25 DIAGNOSIS — Z369 Encounter for antenatal screening, unspecified: Secondary | ICD-10-CM

## 2020-09-14 ENCOUNTER — Other Ambulatory Visit: Payer: Self-pay

## 2020-09-14 ENCOUNTER — Ambulatory Visit (INDEPENDENT_AMBULATORY_CARE_PROVIDER_SITE_OTHER): Payer: BC Managed Care – PPO

## 2020-09-14 DIAGNOSIS — Z3491 Encounter for supervision of normal pregnancy, unspecified, first trimester: Secondary | ICD-10-CM

## 2020-09-14 DIAGNOSIS — Z369 Encounter for antenatal screening, unspecified: Secondary | ICD-10-CM | POA: Diagnosis not present

## 2020-09-14 NOTE — Progress Notes (Signed)
[place] 9+1 wks,single IUP with YS,positive fht 166 bpm,normal ovaries,crl 21.89 mm

## 2020-09-15 LAB — TOXOPLASMA ANTIBODIES- IGG AND  IGM
Toxoplasma Antibody- IgM: <3 AU/mL (ref 0.0–7.9)
Toxoplasma IgG Ratio: <3 IU/mL (ref 0.0–7.1)

## 2020-10-06 ENCOUNTER — Other Ambulatory Visit: Payer: Self-pay | Admitting: Women's Health

## 2020-10-06 ENCOUNTER — Other Ambulatory Visit: Payer: Self-pay | Admitting: Obstetrics & Gynecology

## 2020-10-06 DIAGNOSIS — Z3682 Encounter for antenatal screening for nuchal translucency: Secondary | ICD-10-CM

## 2020-10-07 ENCOUNTER — Other Ambulatory Visit: Payer: BC Managed Care – PPO

## 2020-10-07 ENCOUNTER — Telehealth: Payer: Self-pay | Admitting: Obstetrics & Gynecology

## 2020-10-07 DIAGNOSIS — O0990 Supervision of high risk pregnancy, unspecified, unspecified trimester: Secondary | ICD-10-CM | POA: Insufficient documentation

## 2020-10-07 NOTE — Telephone Encounter (Signed)
Spoke to patient. Appt moved to 7/6

## 2020-10-07 NOTE — Telephone Encounter (Signed)
I called patient because I received a message that she needed to reschedule because she is positive for COVID, patient states that we have to make room for her to have the [place] because she needs that [place] and she has not had any prenatal care and she is almost done with her first trimester and she is very upset, patient states that she will go to another physician's office if she has to and she states that it is not her fault she got COVID and we should have a plan for that. Please contact pt

## 2020-10-08 ENCOUNTER — Ambulatory Visit: Payer: BC Managed Care – PPO | Admitting: *Deleted

## 2020-10-08 ENCOUNTER — Encounter: Payer: BC Managed Care – PPO | Admitting: Advanced Practice Midwife

## 2020-10-14 ENCOUNTER — Ambulatory Visit (INDEPENDENT_AMBULATORY_CARE_PROVIDER_SITE_OTHER): Payer: BC Managed Care – PPO

## 2020-10-14 ENCOUNTER — Other Ambulatory Visit: Payer: BC Managed Care – PPO

## 2020-10-14 ENCOUNTER — Encounter: Payer: Self-pay | Admitting: Advanced Practice Midwife

## 2020-10-14 ENCOUNTER — Other Ambulatory Visit: Payer: Self-pay

## 2020-10-14 ENCOUNTER — Ambulatory Visit (INDEPENDENT_AMBULATORY_CARE_PROVIDER_SITE_OTHER): Payer: BC Managed Care – PPO | Admitting: Advanced Practice Midwife

## 2020-10-14 VITALS — BP 108/67 | HR 66 | Wt 159.0 lb

## 2020-10-14 DIAGNOSIS — Z3A13 13 weeks gestation of pregnancy: Secondary | ICD-10-CM

## 2020-10-14 DIAGNOSIS — F419 Anxiety disorder, unspecified: Secondary | ICD-10-CM | POA: Insufficient documentation

## 2020-10-14 DIAGNOSIS — Z3402 Encounter for supervision of normal first pregnancy, second trimester: Secondary | ICD-10-CM

## 2020-10-14 DIAGNOSIS — F32A Depression, unspecified: Secondary | ICD-10-CM | POA: Insufficient documentation

## 2020-10-14 DIAGNOSIS — F418 Other specified anxiety disorders: Secondary | ICD-10-CM | POA: Insufficient documentation

## 2020-10-14 DIAGNOSIS — Z3682 Encounter for antenatal screening for nuchal translucency: Secondary | ICD-10-CM | POA: Diagnosis not present

## 2020-10-14 DIAGNOSIS — Z3401 Encounter for supervision of normal first pregnancy, first trimester: Secondary | ICD-10-CM

## 2020-10-14 LAB — POCT URINALYSIS DIPSTICK OB
Blood, UA: NEGATIVE
Glucose, UA: NEGATIVE
Ketones, UA: NEGATIVE
Leukocytes, UA: NEGATIVE
Nitrite, UA: NEGATIVE
POC,PROTEIN,UA: NEGATIVE

## 2020-10-14 NOTE — Progress Notes (Signed)
INITIAL OBSTETRICAL VISIT Patient name: Donna Mcknight MRN [id number]  Date of birth: [birth year] Chief Complaint:   Initial Prenatal Visit (Nt/it)  History of Present Illness:   Donna Mcknight is a 25 y.o. G61P0000 Caucasian female at [redacted]w[redacted]d by LMP c/w u/s at 9.1 weeks with an Estimated Date of Delivery: 04/18/21 being seen today for her initial obstetrical visit.   Patient's last menstrual period was 07/12/2020 (exact date). Her obstetrical history is significant for primigravida.   Today she reports  feeling a decreased appetite (nothing sounds appetizing) .  Last pap Aug 17, 2020. Results were: NILM w/ HRHPV negative  Depression screen [facility] 2/9 10/14/2020 08/17/2020 02/10/2020  Decreased Interest 0 0 1  Down, Depressed, Hopeless 0 0 1  PHQ - 2 Score 0 0 2  Altered sleeping 1 0 0  Tired, decreased energy 1 1 0  Change in appetite 3 2 1   Feeling bad or failure about yourself  0 0 0  Trouble concentrating 0 0 0  Moving slowly or fidgety/restless 0 0 0  Suicidal thoughts 0 0 0  PHQ-9 Score 5 3 3      GAD 7 : Generalized Anxiety Score 10/14/2020 08/17/2020 02/10/2020  Nervous, Anxious, on Edge 0 1 1  Control/stop worrying 0 0 0  Worry too much - different things 0 0 0  Trouble relaxing 0 0 0  Restless 0 0 0  Easily annoyed or irritable 0 1 1  Afraid - awful might happen 0 0 0  Total GAD 7 Score 0 2 2     Review of Systems:   Pertinent items are noted in HPI Denies cramping/contractions, leakage of fluid, vaginal bleeding, abnormal vaginal discharge w/ itching/odor/irritation, headaches, visual changes, shortness of breath, chest pain, abdominal pain, severe nausea/vomiting, or problems with urination or bowel movements unless otherwise stated above.  Pertinent History Reviewed:  Reviewed past medical,surgical, social, obstetrical and family history.  Reviewed problem list, medications and allergies. OB History  Gravida Para Term Preterm AB Living  1 0 0 0 0 0  SAB  IAB Ectopic Multiple Live Births  0 0 0 0 0    # Outcome Date GA Lbr Len/2nd Weight Sex Delivery Anes PTL Lv  1 Current            Physical Assessment:   Vitals:   10/14/20 1018  BP: 108/67  Pulse: 66  Weight: 159 lb (72.1 kg)  Body mass index is 26.06 kg/m.       Physical Examination:  General appearance - well appearing, and in no distress  Mental status - alert, oriented to person, place, and time  Psych:  She has a normal mood and affect  Skin - warm and dry, normal color, no suspicious lesions noted  Chest - effort normal, all lung fields clear to auscultation bilaterally  Heart - normal rate and regular rhythm  Abdomen - soft, nontender  Extremities:  No swelling or varicosities noted  Pelvic - not indicated  Thin prep pap is not done    TODAY'S NT [place] 13+3 wks,measurements c/w dates,crl 74.53 mm,NT 1.4 mm,NB present,FHT 150 bpm,normal ovaries,limited view of fetal bladder  Results for orders placed or performed in visit on 10/14/20 (from the past 24 hour(s))  POC Urinalysis Dipstick OB   Collection Time: 10/14/20 10:29 AM  Result Value Ref Range   Color, UA     Clarity, UA     Glucose, UA Negative Negative   Bilirubin, UA  Ketones, UA neg    Spec Grav, UA     Blood, UA neg    pH, UA     POC,PROTEIN,UA Negative Negative, Trace, Small (1+), Moderate (2+), Large (3+), 4+   Urobilinogen, UA     Nitrite, UA neg    Leukocytes, UA Negative Negative   Appearance     Odor      Assessment & Plan:  1) Low-Risk Pregnancy G1P0000 at [redacted]w[redacted]d with an Estimated Date of Delivery: 04/18/21   2) Initial OB visit  3) Anxiety/depression, no meds  Meds: No orders of the defined types were placed in this encounter.   Initial labs obtained Continue prenatal vitamins Reviewed n/v relief measures and warning s/s to report Reviewed recommended weight gain based on pre-gravid BMI Encouraged well-balanced diet Genetic & carrier screening discussed: requests NT/IT, requests  Panorama (already drawn; carrier results already done- neg) Ultrasound discussed; fetal survey: requested CCNC completed> form faxed if has or is planning to apply for medicaid The nature of [facility] [employer] with multiple MDs and other Advanced Practice Providers was explained to patient; also emphasized that fellows, residents, and students are part of our team. Does have home bp cuff. Office bp cuff given: no. Rx sent: no. Check bp weekly, let us know if consistently >140/90.   No indications for ASA therapy or early HgbA1c (per uptodate)  Follow-up: Return in about 3 weeks (around 11/04/2020) for LROB, in person, 2nd IT.   Orders Placed This Encounter  Procedures   Urine Culture   Integrated 1   Pain Management Screening Profile (10S)   CBC/D/Plt+RPR+Rh+ABO+RubIgG...   POC Urinalysis Dipstick OB    Arabella Merles [facility] 10/14/2020 10:46 AM

## 2020-10-14 NOTE — Progress Notes (Signed)
[place] 13+3 wks,measurements c/w dates,crl 74.53 mm,NT 1.4 mm,NB present,FHT 150 bpm,normal ovaries,limited view of bladder

## 2020-10-14 NOTE — Patient Instructions (Signed)
Donna Mcknight, thank you for choosing our office today! We appreciate the opportunity to meet your healthcare needs. You may receive a short survey by mail, e-mail, or through [employer]. If you are happy with your care we would appreciate if you could take just a few minutes to complete the survey questions. We read all of your comments and take your feedback very seriously. Thank you again for choosing our office.  Center for [employer] [facility] Team at [facility]  [facility] at [facility] ([address] [place], [place] [address]) Entrance C, located off of [address] Free 24/7 valet parking   Nausea & Vomiting Have saltine crackers or pretzels by your bed and eat a few bites before you raise your head out of bed in the morning Eat small frequent meals throughout the day instead of large meals Drink plenty of fluids throughout the day to stay hydrated, just don't drink a lot of fluids with your meals.  This can make your stomach fill up faster making you feel sick Do not brush your teeth right after you eat Products with real ginger are good for nausea, like ginger ale and ginger hard candy Make sure it says made with real ginger! Sucking on sour candy like lemon heads is also good for nausea If your prenatal vitamins make you nauseated, take them at night so you will sleep through the nausea Sea Bands If you feel like you need medicine for the nausea & vomiting please let us know If you are unable to keep any fluids or food down please let us know   Constipation Drink plenty of fluid, preferably water, throughout the day Eat foods high in fiber such as fruits, vegetables, and grains Exercise, such as walking, is a good way to keep your bowels regular Drink warm fluids, especially warm prune juice, or decaf coffee Eat a 1/2 cup of real oatmeal (not instant), 1/2 cup applesauce, and 1/2-1 cup warm prune juice every day If needed, you may take Colace (docusate sodium) stool  softener once or twice a day to help keep the stool soft.  If you still are having problems with constipation, you may take Miralax once daily as needed to help keep your bowels regular.   Home Blood Pressure Monitoring for Patients   Your provider has recommended that you check your blood pressure (BP) at least once a week at home. If you do not have a blood pressure cuff at home, one will be provided for you. Contact your provider if you have not received your monitor within 1 week.   Helpful Tips for Accurate Home Blood Pressure Checks  Don't smoke, exercise, or drink caffeine 30 minutes before checking your BP Use the restroom before checking your BP (a full bladder can raise your pressure) Relax in a comfortable upright chair Feet on the ground Left arm resting comfortably on a flat surface at the level of your heart Legs uncrossed Back supported Sit quietly and don't talk Place the cuff on your bare arm Adjust snuggly, so that only two fingertips can fit between your skin and the top of the cuff Check 2 readings separated by at least one minute Keep a log of your BP readings For a visual, please reference this diagram: http://ccnc.care/bpdiagram  Provider Name: [facility]     Phone: [phone]  Zone 1: ALL CLEAR  Continue to monitor your symptoms:  BP reading is less than 140 (top number) or less than 90 (bottom  number)  No right upper stomach pain No headaches or seeing spots No feeling nauseated or throwing up No swelling in face and hands  Zone 2: CAUTION Call your doctor's office for any of the following:  BP reading is greater than 140 (top number) or greater than 90 (bottom number)  Stomach pain under your ribs in the middle or right side Headaches or seeing spots Feeling nauseated or throwing up Swelling in face and hands  Zone 3: EMERGENCY  Seek immediate medical care if you have any of the following:  BP reading is greater than160 (top number) or  greater than 110 (bottom number) Severe headaches not improving with Tylenol Serious difficulty catching your breath Any worsening symptoms from Zone 2    First Trimester of Pregnancy The first trimester of pregnancy is from week 1 until the end of week 12 (months 1 through 3). A week after a sperm fertilizes an egg, the egg will implant on the wall of the uterus. This embryo will begin to develop into a baby. Genes from you and your partner are forming the baby. The female genes determine whether the baby is a boy or a girl. At 6-8 weeks, the eyes and face are formed, and the heartbeat can be seen on ultrasound. At the end of 12 weeks, all the baby's organs are formed.  Now that you are pregnant, you will want to do everything you can to have a healthy baby. Two of the most important things are to get good prenatal care and to follow your health care provider's instructions. Prenatal care is all the medical care you receive before the baby's birth. This care will help prevent, find, and treat any problems during the pregnancy and childbirth. BODY CHANGES Your body goes through many changes during pregnancy. The changes vary from woman to woman.  You may gain or lose a couple of pounds at first. You may feel sick to your stomach (nauseous) and throw up (vomit). If the vomiting is uncontrollable, call your health care provider. You may tire easily. You may develop headaches that can be relieved by medicines approved by your health care provider. You may urinate more often. Painful urination may mean you have a bladder infection. You may develop heartburn as a result of your pregnancy. You may develop constipation because certain hormones are causing the muscles that push waste through your intestines to slow down. You may develop hemorrhoids or swollen, bulging veins (varicose veins). Your breasts may begin to grow larger and become tender. Your nipples may stick out more, and the tissue that  surrounds them (areola) may become darker. Your gums may bleed and may be sensitive to brushing and flossing. Dark spots or blotches (chloasma, mask of pregnancy) may develop on your face. This will likely fade after the baby is born. Your menstrual periods will stop. You may have a loss of appetite. You may develop cravings for certain kinds of food. You may have changes in your emotions from day to day, such as being excited to be pregnant or being concerned that something may go wrong with the pregnancy and baby. You may have more vivid and strange dreams. You may have changes in your hair. These can include thickening of your hair, rapid growth, and changes in texture. Some women also have hair loss during or after pregnancy, or hair that feels dry or thin. Your hair will most likely return to normal after your baby is born. WHAT TO EXPECT AT YOUR PRENATAL  VISITS During a routine prenatal visit: You will be weighed to make sure you and the baby are growing normally. Your blood pressure will be taken. Your abdomen will be measured to track your baby's growth. The fetal heartbeat will be listened to starting around week 10 or 12 of your pregnancy. Test results from any previous visits will be discussed. Your health care provider may ask you: How you are feeling. If you are feeling the baby move. If you have had any abnormal symptoms, such as leaking fluid, bleeding, severe headaches, or abdominal cramping. If you have any questions. Other tests that may be performed during your first trimester include: Blood tests to find your blood type and to check for the presence of any previous infections. They will also be used to check for low iron levels (anemia) and Rh antibodies. Later in the pregnancy, blood tests for diabetes will be done along with other tests if problems develop. Urine tests to check for infections, diabetes, or protein in the urine. An ultrasound to confirm the proper growth  and development of the baby. An amniocentesis to check for possible genetic problems. Fetal screens for spina bifida and Down syndrome. You may need other tests to make sure you and the baby are doing well. HOME CARE INSTRUCTIONS  Medicines Follow your health care provider's instructions regarding medicine use. Specific medicines may be either safe or unsafe to take during pregnancy. Take your prenatal vitamins as directed. If you develop constipation, try taking a stool softener if your health care provider approves. Diet Eat regular, well-balanced meals. Choose a variety of foods, such as meat or vegetable-based protein, fish, milk and low-fat dairy products, vegetables, fruits, and whole grain breads and cereals. Your health care provider will help you determine the amount of weight gain that is right for you. Avoid raw meat and uncooked cheese. These carry germs that can cause birth defects in the baby. Eating four or five small meals rather than three large meals a day may help relieve nausea and vomiting. If you start to feel nauseous, eating a few soda crackers can be helpful. Drinking liquids between meals instead of during meals also seems to help nausea and vomiting. If you develop constipation, eat more high-fiber foods, such as fresh vegetables or fruit and whole grains. Drink enough fluids to keep your urine clear or pale yellow. Activity and Exercise Exercise only as directed by your health care provider. Exercising will help you: Control your weight. Stay in shape. Be prepared for labor and delivery. Experiencing pain or cramping in the lower abdomen or low back is a good sign that you should stop exercising. Check with your health care provider before continuing normal exercises. Try to avoid standing for long periods of time. Move your legs often if you must stand in one place for a long time. Avoid heavy lifting. Wear low-heeled shoes, and practice good posture. You may  continue to have sex unless your health care provider directs you otherwise. Relief of Pain or Discomfort Wear a good support bra for breast tenderness.   Take warm sitz baths to soothe any pain or discomfort caused by hemorrhoids. Use hemorrhoid cream if your health care provider approves.   Rest with your legs elevated if you have leg cramps or low back pain. If you develop varicose veins in your legs, wear support hose. Elevate your feet for 15 minutes, 3-4 times a day. Limit salt in your diet. Prenatal Care Schedule your prenatal visits by the  twelfth week of pregnancy. They are usually scheduled monthly at first, then more often in the last 2 months before delivery. Write down your questions. Take them to your prenatal visits. Keep all your prenatal visits as directed by your health care provider. Safety Wear your seat belt at all times when driving. Make a list of emergency phone numbers, including numbers for family, friends, the hospital, and police and fire departments. General Tips Ask your health care provider for a referral to a local prenatal education class. Begin classes no later than at the beginning of month 6 of your pregnancy. Ask for help if you have counseling or nutritional needs during pregnancy. Your health care provider can offer advice or refer you to specialists for help with various needs. Do not use hot tubs, steam rooms, or saunas. Do not douche or use tampons or scented sanitary pads. Do not cross your legs for long periods of time. Avoid cat litter boxes and soil used by cats. These carry germs that can cause birth defects in the baby and possibly loss of the fetus by miscarriage or stillbirth. Avoid all smoking, herbs, alcohol, and medicines not prescribed by your health care provider. Chemicals in these affect the formation and growth of the baby. Schedule a dentist appointment. At home, brush your teeth with a soft toothbrush and be gentle when you floss. SEEK  MEDICAL CARE IF:  You have dizziness. You have mild pelvic cramps, pelvic pressure, or nagging pain in the abdominal area. You have persistent nausea, vomiting, or diarrhea. You have a bad smelling vaginal discharge. You have pain with urination. You notice increased swelling in your face, hands, legs, or ankles. SEEK IMMEDIATE MEDICAL CARE IF:  You have a fever. You are leaking fluid from your vagina. You have spotting or bleeding from your vagina. You have severe abdominal cramping or pain. You have rapid weight gain or loss. You vomit blood or material that looks like coffee grounds. You are exposed to [place] measles and have never had them. You are exposed to fifth disease or chickenpox. You develop a severe headache. You have shortness of breath. You have any kind of trauma, such as from a fall or a car accident. Document Released: 03/22/2001 Document Revised: 08/12/2013 Document Reviewed: 02/05/2013 [facility] Patient Information 2015 [place], [place]. This information is not intended to replace advice given to you by your health care provider. Make sure you discuss any questions you have with your health care provider.

## 2020-10-15 LAB — PMP SCREEN PROFILE (10S), URINE
Amphetamine Scrn, Ur: NEGATIVE ng/mL
BARBITURATE SCREEN URINE: NEGATIVE ng/mL
BENZODIAZEPINE SCREEN, URINE: NEGATIVE ng/mL
CANNABINOIDS UR QL SCN: NEGATIVE ng/mL
Cocaine (Metab) Scrn, Ur: NEGATIVE ng/mL
Creatinine(Crt), U: 90.1 mg/dL (ref 20.0–300.0)
Methadone Screen, Urine: NEGATIVE ng/mL
OXYCODONE+OXYMORPHONE UR QL SCN: NEGATIVE ng/mL
Opiate Scrn, Ur: NEGATIVE ng/mL
Ph of Urine: 6 (ref 4.5–8.9)
Phencyclidine Qn, Ur: NEGATIVE ng/mL
Propoxyphene Scrn, Ur: NEGATIVE ng/mL

## 2020-10-16 LAB — MATERNIT GENOME

## 2020-10-16 LAB — CBC/D/PLT+RPR+RH+ABO+RUBIGG...
Antibody Screen: NEGATIVE
Basophils Absolute: 0 10*3/uL (ref 0.0–0.2)
Basos: 0 %
EOS (ABSOLUTE): 0 10*3/uL (ref 0.0–0.4)
Eos: 0 %
HCV Ab: <0.1 s/co ratio (ref 0.0–0.9)
HIV Screen 4th Generation wRfx: NONREACTIVE
Hematocrit: 37.1 % (ref 34.0–46.6)
Hemoglobin: 12.4 g/dL (ref 11.1–15.9)
Hepatitis B Surface Ag: NEGATIVE
Immature Grans (Abs): 0.1 10*3/uL (ref 0.0–0.1)
Immature Granulocytes: 1 %
Lymphocytes Absolute: 1.6 10*3/uL (ref 0.7–3.1)
Lymphs: 17 %
MCH: 28.9 pg (ref 26.6–33.0)
MCHC: 33.4 g/dL (ref 31.5–35.7)
MCV: 87 fL (ref 79–97)
Monocytes Absolute: 0.6 10*3/uL (ref 0.1–0.9)
Monocytes: 6 %
Neutrophils Absolute: 7 10*3/uL (ref 1.4–7.0)
Neutrophils: 76 %
Platelets: 343 10*3/uL (ref 150–450)
RBC: 4.29 x10E6/uL (ref 3.77–5.28)
RDW: 13.5 % (ref 11.7–15.4)
RPR Ser Ql: NONREACTIVE
Rh Factor: POSITIVE
Rubella Antibodies, IGG: 1.78 index (ref >0.99)
WBC: 9.4 10*3/uL (ref 3.4–10.8)

## 2020-10-16 LAB — URINE CULTURE

## 2020-10-16 LAB — INTEGRATED 1
Crown Rump Length: 74.5 mm
Gest. Age on Collection Date: 13.3 weeks
Maternal Age at EDD: 26.5 yr
Nuchal Translucency (NT): 1.4 mm
Number of Fetuses: 1
PAPP-A Value: 1050.2 ng/mL
Weight: 159 [lb_av]

## 2020-10-16 LAB — HCV INTERPRETATION

## 2020-10-19 ENCOUNTER — Telehealth: Payer: Self-pay | Admitting: Advanced Practice Midwife

## 2020-10-19 NOTE — Telephone Encounter (Signed)
PT CALLING STATING THAT SHAW SENT HER A MESSAGE ABOUT THE GENETIC TESTING FAILED AND IF SHE WANTED IT REDONE A THE LAB TO LET HER KNOW AND SHE WOULD PUT A ORDER IN. PATIENT DOES WANT TO REDO THE TEST IF SOMEONE WILL PUT THE ORDER IN PLEASE . Marland KitchenMarland KitchenMarland Kitchen

## 2020-10-20 ENCOUNTER — Other Ambulatory Visit: Payer: BC Managed Care – PPO

## 2020-10-20 DIAGNOSIS — Z1379 Encounter for other screening for genetic and chromosomal anomalies: Secondary | ICD-10-CM

## 2020-10-20 DIAGNOSIS — Z3A14 14 weeks gestation of pregnancy: Secondary | ICD-10-CM

## 2020-10-23 ENCOUNTER — Encounter: Payer: BC Managed Care – PPO | Admitting: Advanced Practice Midwife

## 2020-10-26 LAB — MATERNIT GENOME
11q23 deletion (Jacobsen): NEGATIVE
15q11 deletion (PW Angelman): NEGATIVE
1p36 deletion syndrome: NEGATIVE
22q11 deletion (DiGeorge): NEGATIVE
4p16 deletion(Wolf-Hirschhorn): NEGATIVE
5p15 deletion (Cri-du-chat): NEGATIVE
8q24 deletion (Langer-Giedion): NEGATIVE
Fetal Fraction: 7
Gains/Losses >=7 Mb: NEGATIVE
Monosomy X (Turner Syndrome): NEGATIVE
Other autosomal aneuploidies: NEGATIVE
Result (T21): NEGATIVE
Trisomy 13 (Patau syndrome): NEGATIVE
Trisomy 18 (Edwards syndrome): NEGATIVE
Trisomy 21 (Down syndrome): NEGATIVE
XXX (Triple X Syndrome): NEGATIVE
XXY (Klinefelter Syndrome): NEGATIVE
XYY (Jacobs Syndrome): NEGATIVE

## 2020-11-02 ENCOUNTER — Other Ambulatory Visit: Payer: Self-pay

## 2020-11-02 ENCOUNTER — Ambulatory Visit (INDEPENDENT_AMBULATORY_CARE_PROVIDER_SITE_OTHER): Payer: BC Managed Care – PPO | Admitting: Advanced Practice Midwife

## 2020-11-02 ENCOUNTER — Encounter: Payer: Self-pay | Admitting: Advanced Practice Midwife

## 2020-11-02 VITALS — BP 107/71 | HR 75 | Wt 160.0 lb

## 2020-11-02 DIAGNOSIS — Z3A16 16 weeks gestation of pregnancy: Secondary | ICD-10-CM

## 2020-11-02 DIAGNOSIS — Z3402 Encounter for supervision of normal first pregnancy, second trimester: Secondary | ICD-10-CM

## 2020-11-02 NOTE — Progress Notes (Signed)
   PRENATAL VISIT NOTE  Subjective:  Donna Mcknight is a 25 y.o. G1P0000 at [redacted]w[redacted]d being seen today for ongoing prenatal care.  She is currently monitored for the following issues for this low-risk pregnancy and has Screening for cervical cancer; Supervision of normal first pregnancy; and Anxiety and depression on their problem list.  Patient reports no complaints.  Contractions: Not present. Vag. Bleeding: None.   . Denies leaking of fluid.   The following portions of the patient's history were reviewed and updated as appropriate: allergies, current medications, past family history, past medical history, past social history, past surgical history and problem list.   Objective:   Vitals:   11/02/20 1211  BP: 107/71  Pulse: 75  Weight: 160 lb (72.6 kg)    Fetal Status: Fetal Heart Rate (bpm): 145 Fundal Height: 16 cm       General:  Alert, oriented and cooperative. Patient is in no acute distress.  Skin: Skin is warm and dry. No rash noted.   Cardiovascular: Normal heart rate noted  Respiratory: Normal respiratory effort, no problems with respiration noted  Abdomen: Soft, gravid, appropriate for gestational age.  Pain/Pressure: Absent     Pelvic: Cervical exam deferred        Extremities: Normal range of motion.  Edema: None  Mental Status: Normal mood and affect. Normal behavior. Normal judgment and thought content.   Assessment and Plan:  Pregnancy: G1P0000 at [redacted]w[redacted]d 1. Encounter for supervision of normal first pregnancy in second trimester - INTEGRATED 2  2. [redacted] weeks gestation of pregnancy - INTEGRATED 2 - Anatomy scan at next visit   Preterm labor symptoms and general obstetric precautions including but not limited to vaginal bleeding, contractions, leaking of fluid and fetal movement were reviewed in detail with the patient. Please refer to After Visit Summary for other counseling recommendations.   Return in about 3 weeks (around 11/23/2020).  No future  appointments.  Thressa Sheller DNP, CNM  11/02/20  12:45 PM

## 2020-11-11 LAB — INTEGRATED 2
AFP MoM: 0.95
Alpha-Fetoprotein: 29.9 ng/mL
Crown Rump Length: 74.5 mm
DIA MoM: 0.8
DIA Value: 119.4 pg/mL
Estriol, Unconjugated: 1.18 ng/mL
Gest. Age on Collection Date: 13.3 weeks
Gestational Age: 16 weeks
Maternal Age at EDD: 26.5 yr
Nuchal Translucency (NT): 1.4 mm
Nuchal Translucency MoM: 0.82
Number of Fetuses: 1
PAPP-A MoM: 0.85
PAPP-A Value: 1050.2 ng/mL
Test Results:: NEGATIVE
Weight: 159 [lb_av]
Weight: 159 [lb_av]
hCG MoM: 1.33
hCG Value: 47.2 IU/mL
uE3 MoM: 1.22

## 2020-12-03 ENCOUNTER — Other Ambulatory Visit: Payer: BC Managed Care – PPO

## 2020-12-03 ENCOUNTER — Encounter: Payer: BC Managed Care – PPO | Admitting: Advanced Practice Midwife

## 2020-12-08 ENCOUNTER — Other Ambulatory Visit: Payer: Self-pay | Admitting: Obstetrics & Gynecology

## 2020-12-08 DIAGNOSIS — Z363 Encounter for antenatal screening for malformations: Secondary | ICD-10-CM

## 2020-12-10 ENCOUNTER — Ambulatory Visit (INDEPENDENT_AMBULATORY_CARE_PROVIDER_SITE_OTHER): Payer: BC Managed Care – PPO

## 2020-12-10 ENCOUNTER — Other Ambulatory Visit: Payer: Self-pay

## 2020-12-10 ENCOUNTER — Encounter: Payer: Self-pay | Admitting: Obstetrics & Gynecology

## 2020-12-10 ENCOUNTER — Ambulatory Visit (INDEPENDENT_AMBULATORY_CARE_PROVIDER_SITE_OTHER): Payer: BC Managed Care – PPO | Admitting: Obstetrics & Gynecology

## 2020-12-10 VITALS — BP 118/72 | HR 76 | Wt 167.2 lb

## 2020-12-10 DIAGNOSIS — Z3A21 21 weeks gestation of pregnancy: Secondary | ICD-10-CM | POA: Diagnosis not present

## 2020-12-10 DIAGNOSIS — F419 Anxiety disorder, unspecified: Secondary | ICD-10-CM

## 2020-12-10 DIAGNOSIS — Z363 Encounter for antenatal screening for malformations: Secondary | ICD-10-CM

## 2020-12-10 DIAGNOSIS — Z3402 Encounter for supervision of normal first pregnancy, second trimester: Secondary | ICD-10-CM

## 2020-12-10 NOTE — Progress Notes (Signed)
LOW-RISK PREGNANCY VISIT Patient name: Donna Mcknight MRN [id number]  Date of birth: 18-Aug-1995 Chief Complaint:   Routine Prenatal Visit  History of Present Illness:   Donna Mcknight is a 25 y.o. G31P0000 female at [redacted]w[redacted]d with an Estimated Date of Delivery: 04/18/21 being seen today for ongoing management of a low-risk pregnancy.   Depression screen [facility] 2/9 10/14/2020 08/17/2020 02/10/2020  Decreased Interest 0 0 1  Down, Depressed, Hopeless 0 0 1  PHQ - 2 Score 0 0 2  Altered sleeping 1 0 0  Tired, decreased energy 1 1 0  Change in appetite 3 2 1   Feeling bad or failure about yourself  0 0 0  Trouble concentrating 0 0 0  Moving slowly or fidgety/restless 0 0 0  Suicidal thoughts 0 0 0  PHQ-9 Score 5 3 3     Today she reports no complaints. Contractions: Not present. Vag. Bleeding: None.  Movement: Present. denies leaking of fluid. Review of Systems:   Pertinent items are noted in HPI Denies abnormal vaginal discharge w/ itching/odor/irritation, headaches, visual changes, shortness of breath, chest pain, abdominal pain, severe nausea/vomiting, or problems with urination or bowel movements unless otherwise stated above. Pertinent History Reviewed:  Reviewed past medical,surgical, social, obstetrical and family history.  Reviewed problem list, medications and allergies.  Physical Assessment:   Vitals:   12/10/20 1415  BP: 118/72  Pulse: 76  Weight: 167 lb 3.2 oz (75.8 kg)  Body mass index is 27.4 kg/m.        Physical Examination:   General appearance: Well appearing, and in no distress  Mental status: Alert, oriented to person, place, and time  Skin: Warm & dry  Respiratory: Normal respiratory effort, no distress  Abdomen: Soft, gravid, nontender  Pelvic: Cervical exam deferred         Extremities: Edema: None  Psych:  mood and affect appropriate  Fetal Status: Fetal Heart Rate (bpm): 152 u/s   Movement: Present    Chaperone: n/a    No results found  for this or any previous visit (from the past 24 hour(s)).   Assessment & Plan:  1) Low-risk pregnancy G1P0000 at [redacted]w[redacted]d with an Estimated Date of Delivery: 04/18/21     Meds: No orders of the defined types were placed in this encounter.  Labs/procedures today: Anatomy scan today- [place] 21+4 wks,cephalic,anterior placenta gr 0,normal ovaries,fhr 152 bpm,svp of fluid 4.2 cm,EFW 418 g 32%,anatomy complete,no obvious abnormalities   Plan:  Continue routine obstetrical care  Next visit: prefers in person  , []  PN-2 next visit  Reviewed: Preterm labor symptoms and general obstetric precautions including but not limited to vaginal bleeding, contractions, leaking of fluid and fetal movement were reviewed in detail with the patient.  All questions were answered. Pt has home bp cuff. Check bp weekly, let us know if >140/90.   Follow-up: Return in about 4 weeks (around 01/07/2021) for LROB visit- PN2 (ok for 4-5wks).  No orders of the defined types were placed in this encounter.   Myna Hidalgo, DO Attending Obstetrician & Gynecologist, [facility] for [employer], [facility]

## 2020-12-10 NOTE — Progress Notes (Signed)
[place] 21+4 wks,cephalic,anterior placenta gr 0,normal ovaries,fhr 152 bpm,svp of fluid 4.2 cm,EFW 418 g 32%,anatomy complete,no obvious abnormalities

## 2021-01-08 ENCOUNTER — Ambulatory Visit (INDEPENDENT_AMBULATORY_CARE_PROVIDER_SITE_OTHER): Payer: BC Managed Care – PPO | Admitting: Obstetrics & Gynecology

## 2021-01-08 ENCOUNTER — Other Ambulatory Visit: Payer: Self-pay

## 2021-01-08 ENCOUNTER — Other Ambulatory Visit: Payer: BC Managed Care – PPO

## 2021-01-08 ENCOUNTER — Encounter: Payer: Self-pay | Admitting: Obstetrics & Gynecology

## 2021-01-08 VITALS — BP 110/72 | HR 76 | Wt 173.8 lb

## 2021-01-08 DIAGNOSIS — Z3A25 25 weeks gestation of pregnancy: Secondary | ICD-10-CM

## 2021-01-08 DIAGNOSIS — O24410 Gestational diabetes mellitus in pregnancy, diet controlled: Secondary | ICD-10-CM

## 2021-01-08 DIAGNOSIS — Z23 Encounter for immunization: Secondary | ICD-10-CM | POA: Diagnosis not present

## 2021-01-08 DIAGNOSIS — Z3402 Encounter for supervision of normal first pregnancy, second trimester: Secondary | ICD-10-CM

## 2021-01-08 MED ORDER — OMEPRAZOLE 20 MG PO CPDR: 20.0000 mg | DELAYED_RELEASE_CAPSULE | Freq: Every day | ORAL | 6 refills | Status: DC

## 2021-01-08 MED ORDER — POLYETHYLENE GLYCOL 3350 17 GM/SCOOP PO POWD: ORAL | 11 refills | Status: DC

## 2021-01-08 NOTE — Progress Notes (Signed)
LOW-RISK PREGNANCY VISIT Patient name: Donna Mcknight MRN [id number]  Date of birth: 11-Jan-1996 Chief Complaint:   Routine Prenatal Visit (PN2)  History of Present Illness:   Donna Mcknight is a 25 y.o. G30P0000 female at [redacted]w[redacted]d with an Estimated Date of Delivery: 04/18/21 being seen today for ongoing management of a low-risk pregnancy.  Depression screen [facility] 2/9 01/08/2021 10/14/2020 08/17/2020 02/10/2020  Decreased Interest 0 0 0 1  Down, Depressed, Hopeless 0 0 0 1  PHQ - 2 Score 0 0 0 2  Altered sleeping 0 1 0 0  Tired, decreased energy 2 1 1  0  Change in appetite 1 3 2 1   Feeling bad or failure about yourself  0 0 0 0  Trouble concentrating 0 0 0 0  Moving slowly or fidgety/restless 0 0 0 0  Suicidal thoughts 0 0 0 0  PHQ-9 Score 3 5 3 3     Today she reports heartburn/GERD, chronic constipation. Contractions: Not present. Vag. Bleeding: None.  Movement: Present. denies leaking of fluid. Review of Systems:   Pertinent items are noted in HPI Denies abnormal vaginal discharge w/ itching/odor/irritation, headaches, visual changes, shortness of breath, chest pain, abdominal pain, severe nausea/vomiting, or problems with urination or bowel movements unless otherwise stated above. Pertinent History Reviewed:  Reviewed past medical,surgical, social, obstetrical and family history.  Reviewed problem list, medications and allergies. Physical Assessment:   Vitals:   01/08/21 0904  BP: 110/72  Pulse: 76  Weight: 173 lb 12.8 oz (78.8 kg)  Body mass index is 28.48 kg/m.        Physical Examination:   General appearance: Well appearing, and in no distress  Mental status: Alert, oriented to person, place, and time  Skin: Warm & dry  Cardiovascular: Normal heart rate noted  Respiratory: Normal respiratory effort, no distress  Abdomen: Soft, gravid, nontender  Pelvic: Cervical exam deferred         Extremities: Edema: None  Fetal Status: Fetal Heart Rate (bpm): 152  Fundal Height: 26 cm Movement: Present    Chaperone: n/a    No results found for this or any previous visit (from the past 24 hour(s)).  Assessment & Plan:  1) Low-risk pregnancy G1P0000 at [redacted]w[redacted]d with an Estimated Date of Delivery: 04/18/21   2) GERD/heartburn, Rx omeprazole  3)  Chronic constipation   Meds:  Meds ordered this encounter  Medications   omeprazole (PRILOSEC) 20 MG capsule    Sig: Take 1 capsule (20 mg total) by mouth daily. 1 tablet a day    Dispense:  30 capsule    Refill:  6   polyethylene glycol powder (GLYCOLAX/MIRALAX) 17 GM/SCOOP powder    Sig: 1 scoop daily or as needed    Dispense:  255 g    Refill:  11   Labs/procedures today: today  Plan:  Continue routine obstetrical care  Next visit: prefers in person    Reviewed: Preterm labor symptoms and general obstetric precautions including but not limited to vaginal bleeding, contractions, leaking of fluid and fetal movement were reviewed in detail with the patient.  All questions were answered. Has home bp cuff. Rx faxed to . Check bp weekly, let us know if >140/90.   Follow-up: Return in about 4 weeks (around 02/05/2021).  Orders Placed This Encounter  Procedures   Tdap vaccine greater than or equal to 7yo IM    Lazaro Arms, MD 01/08/2021 10:19 AM

## 2021-01-09 LAB — CBC
Hematocrit: 35.8 % (ref 34.0–46.6)
Hemoglobin: 11.7 g/dL (ref 11.1–15.9)
MCH: 29.5 pg (ref 26.6–33.0)
MCHC: 32.7 g/dL (ref 31.5–35.7)
MCV: 90 fL (ref 79–97)
Platelets: 320 10*3/uL (ref 150–450)
RBC: 3.96 x10E6/uL (ref 3.77–5.28)
RDW: 12.6 % (ref 11.7–15.4)
WBC: 11.8 10*3/uL — ABNORMAL HIGH (ref 3.4–10.8)

## 2021-01-09 LAB — GLUCOSE TOLERANCE, 2 HOURS W/ 1HR
Glucose, 1 hour: 184 mg/dL — ABNORMAL HIGH (ref 65–179)
Glucose, 2 hour: 144 mg/dL (ref 65–152)
Glucose, Fasting: 80 mg/dL (ref 65–91)

## 2021-01-09 LAB — ANTIBODY SCREEN: Antibody Screen: NEGATIVE

## 2021-01-09 LAB — RPR: RPR Ser Ql: NONREACTIVE

## 2021-01-09 LAB — HIV ANTIBODY (ROUTINE TESTING W REFLEX): HIV Screen 4th Generation wRfx: NONREACTIVE

## 2021-01-12 ENCOUNTER — Encounter: Payer: Self-pay | Admitting: Women's Health

## 2021-01-12 ENCOUNTER — Other Ambulatory Visit: Payer: Self-pay | Admitting: Women's Health

## 2021-01-12 ENCOUNTER — Telehealth: Payer: Self-pay | Admitting: Women's Health

## 2021-01-12 DIAGNOSIS — O24410 Gestational diabetes mellitus in pregnancy, diet controlled: Secondary | ICD-10-CM | POA: Insufficient documentation

## 2021-01-12 MED ORDER — ONETOUCH ULTRASOFT LANCETS MISC: 99 refills | Status: DC

## 2021-01-12 MED ORDER — ONETOUCH VERIO W/DEVICE KIT: PACK | 0 refills | Status: DC

## 2021-01-12 MED ORDER — ASPIRIN 81 MG PO TBEC: 81.0000 mg | DELAYED_RELEASE_TABLET | Freq: Every day | ORAL | 3 refills | Status: DC

## 2021-01-12 MED ORDER — GLUCOSE BLOOD VI STRP
ORAL_STRIP | 99 refills | Status: DC
Start: 2021-01-12 — End: 2021-04-19

## 2021-01-12 NOTE — Telephone Encounter (Signed)
Patient called and said her pharmacy has the aspirin prescription but they do not have the order for the glucometer/strips/lancets QS for QID testing w/ prn refills

## 2021-01-12 NOTE — Telephone Encounter (Signed)
Returned pt's call. Informed pt that her glucose testing supplies had been sent to the pharmacy approx 15 min after her call, so they didn't have them yet. She was instructed to check with her pharmacy later today and let us know if there were any issues. Pt confirmed understanding.

## 2021-01-12 NOTE — Addendum Note (Signed)
Addended by: Leilani Able, Bharat Antillon A on: 01/12/2021 02:53 PM   Modules accepted: Orders

## 2021-01-21 ENCOUNTER — Ambulatory Visit: Payer: BC Managed Care – PPO | Admitting: Registered"

## 2021-01-21 ENCOUNTER — Encounter: Payer: BC Managed Care – PPO | Attending: Women's Health | Admitting: Registered"

## 2021-01-21 ENCOUNTER — Other Ambulatory Visit: Payer: Self-pay

## 2021-01-21 DIAGNOSIS — O24419 Gestational diabetes mellitus in pregnancy, unspecified control: Secondary | ICD-10-CM | POA: Diagnosis not present

## 2021-01-21 DIAGNOSIS — Z3A00 Weeks of gestation of pregnancy not specified: Secondary | ICD-10-CM | POA: Insufficient documentation

## 2021-01-21 DIAGNOSIS — O24410 Gestational diabetes mellitus in pregnancy, diet controlled: Secondary | ICD-10-CM

## 2021-01-21 NOTE — Progress Notes (Signed)
Patient was seen for Gestational Diabetes self-management on 01/21/21  Start time 1515 and End time 1615   Estimated due date: 04/18/2021; [redacted]w[redacted]d  Clinical: Medications: aspirin, prilosec, prenatal viatmin Medical History: reviewed Labs: OGTT 80-184(H)-144, A1c n/a%   Dietary and Lifestyle History: Patient states prior to pregnancy she had been physically active participating in things like High Intensity workouts. Pt states she had stopped a couple of months before pregnancy, and recently has started doing a modified version 1x week and noticed that her blood sugar was improved with exercise.  Pt states she makes effort to choose foods with more fiber. Pt states she also has started incorporating high protein versions of some foods such as milk (Fairlife), and extra protein oats.  Pt states she was getting good sleep until recently due to discomfort of pregnancy.  Physical Activity: 1x week 60 min? Stress: not assessed Sleep: 6-9 hrs  24 hr Recall: First Meal: raisin bran or quaker extra protein, 1% milk Snack: questbar or cliff bar OR activia yogurt Second meal: [place] and cheese on low carb/high fiber bread Snack: pretzels and peanut butter OR apple, peanut butter Third meal: Healthy Choice frozen meal OR order out Snack: fruit frozen bars OR a couple of pieces of dark chocolate Beverages: water, occasional Zero powerade or Trop 50  NUTRITION INTERVENTION  Nutrition education (E-1) on the following topics:   Initial Follow-up  [x]  []  Definition of Gestational Diabetes [x]  []  Why dietary management is important in controlling blood glucose [x]  []  Effects each nutrient has on blood glucose levels [x]  []  Simple carbohydrates vs complex carbohydrates []  []  Fluid intake [x]  []  Creating a balanced meal plan [x]  []  Carbohydrate counting  [x]  []  When to check blood glucose levels [x]  []  Proper blood glucose monitoring techniques [x]  []  Effect of stress and stress reduction  techniques  [x]  []  Exercise effect on blood glucose levels, appropriate exercise during pregnancy [x]  []  Importance of limiting caffeine and abstaining from alcohol and smoking [x]  []  Medications used for blood sugar control during pregnancy [x]  []  Hypoglycemia and rule of 15 [x]  []  Postpartum self care  Patient already has a meter, is testing pre breakfast and 2 hours after each meal. Pt reports has been checking about 1 week. FBS: 87-93 Postprandial: 90-129 (7x >120)  Patient instructed to monitor glucose levels: FBS: 60 - ? 95 mg/dL (some clinics use 90 for cutoff) 1 hour: ? 140 mg/dL 2 hour: ? mg/dL  Patient received handouts: Nutrition Diabetes and Pregnancy Carbohydrate Counting List Nutrition in the Fast Lane  Patient will be seen for follow-up as needed.

## 2021-02-01 ENCOUNTER — Other Ambulatory Visit: Payer: Self-pay | Admitting: Women's Health

## 2021-02-05 ENCOUNTER — Other Ambulatory Visit: Payer: Self-pay

## 2021-02-05 ENCOUNTER — Encounter: Payer: Self-pay | Admitting: Obstetrics & Gynecology

## 2021-02-05 ENCOUNTER — Ambulatory Visit (INDEPENDENT_AMBULATORY_CARE_PROVIDER_SITE_OTHER): Payer: BC Managed Care – PPO | Admitting: Obstetrics & Gynecology

## 2021-02-05 VITALS — BP 127/71 | HR 79 | Wt 177.5 lb

## 2021-02-05 DIAGNOSIS — O0993 Supervision of high risk pregnancy, unspecified, third trimester: Secondary | ICD-10-CM

## 2021-02-05 LAB — POCT URINALYSIS DIPSTICK OB
Blood, UA: NEGATIVE
Glucose, UA: NEGATIVE
Ketones, UA: NEGATIVE
Leukocytes, UA: NEGATIVE
Nitrite, UA: NEGATIVE
POC,PROTEIN,UA: NEGATIVE

## 2021-02-05 NOTE — Progress Notes (Signed)
HIGH-RISK PREGNANCY VISIT Patient name: Donna Mcknight MRN [id number]  Date of birth: 03/22/1996 Chief Complaint:   Routine Prenatal Visit and High Risk Gestation  History of Present Illness:   Donna Mcknight is a 25 y.o. G14P0000 female at [redacted]w[redacted]d with an Estimated Date of Delivery: 04/18/21 being seen today for ongoing management of a high-risk pregnancy complicated by:  GDMA1: well controlled.    Today she reports no complaints.   Contractions: Not present. Vag. Bleeding: None.  Movement: Present. denies leaking of fluid.   Depression screen [facility] 2/9 01/08/2021 10/14/2020 08/17/2020 02/10/2020  Decreased Interest 0 0 0 1  Down, Depressed, Hopeless 0 0 0 1  PHQ - 2 Score 0 0 0 2  Altered sleeping 0 1 0 0  Tired, decreased energy 2 1 1  0  Change in appetite 1 3 2 1   Feeling bad or failure about yourself  0 0 0 0  Trouble concentrating 0 0 0 0  Moving slowly or fidgety/restless 0 0 0 0  Suicidal thoughts 0 0 0 0  PHQ-9 Score 3 5 3 3      Current Outpatient Medications  Medication Instructions   aspirin 81 mg, Oral, Daily, Swallow whole.   Blood Glucose Monitoring Suppl (ONETOUCH VERIO) w/Device KIT Use as directed to check blood sugar 4 times daily   glucose blood test strip Use as instructed to check blood sugar 4 times daily   Lancets (ONETOUCH ULTRASOFT) lancets Use as directed to check blood sugar 4 times daily   omeprazole (PRILOSEC) 20 mg, Oral, Daily, 1 tablet a day   polyethylene glycol powder (GLYCOLAX/MIRALAX) 17 GM/SCOOP powder 1 scoop daily or as needed   Prenatal Vit-Fe Fumarate-FA (PRENATAL VITAMIN PO) Oral     Review of Systems:   Pertinent items are noted in HPI Denies abnormal vaginal discharge w/ itching/odor/irritation, headaches, visual changes, shortness of breath, chest pain, abdominal pain, severe nausea/vomiting, or problems with urination or bowel movements unless otherwise stated above. Pertinent History Reviewed:  Reviewed past  medical,surgical, social, obstetrical and family history.  Reviewed problem list, medications and allergies. Physical Assessment:   Vitals:   02/05/21 0856  BP: 127/71  Pulse: 79  Weight: 177 lb 8 oz (80.5 kg)  Body mass index is 29.09 kg/m.           Physical Examination:   General appearance: alert, well appearing, and in no distress  Mental status: alert, oriented to person, place, and time  Skin: warm & dry   Extremities: Edema: Trace    Cardiovascular: normal heart rate noted  Respiratory: normal respiratory effort, no distress  Abdomen: gravid, soft, non-tender  Pelvic: Cervical exam deferred         Fetal Status: Fetal Heart Rate (bpm): 150 Fundal Height: 28 cm Movement: Present    Fetal Surveillance Testing today: doppler   Chaperone: N/A    Results for orders placed or performed in visit on 02/05/21 (from the past 24 hour(s))  POC Urinalysis Dipstick OB   Collection Time: 02/05/21  8:58 AM  Result Value Ref Range   Color, UA     Clarity, UA     Glucose, UA Negative Negative   Bilirubin, UA     Ketones, UA neg    Spec Grav, UA     Blood, UA neg    pH, UA     POC,PROTEIN,UA Negative Negative, Trace, Small (1+), Moderate (2+), Large (3+), 4+   Urobilinogen, UA     Nitrite, UA  neg    Leukocytes, UA Negative Negative   Appearance     Odor       Assessment & Plan:  High-risk pregnancy: G1P0000 at [redacted]w[redacted]d with an Estimated Date of Delivery: 04/18/21   1) GMDA1 -well controlled -continue growth q 4wks []  consider antepartum testing if pt is started on medication   Meds: No orders of the defined types were placed in this encounter.   Labs/procedures today: none  Treatment Plan:  as outlined above  Reviewed: Preterm labor symptoms and general obstetric precautions including but not limited to vaginal bleeding, contractions, leaking of fluid and fetal movement were reviewed in detail with the patient.  All questions were answered. Pt has home bp cuff. Check  bp weekly, let us know if >140/90.   Follow-up: Return in about 2 weeks (around 02/19/2021) for HROB visit (ok for CNM), growth in 2 wks then every 4 (32, 36).   Future Appointments  Date Time Provider Department Center  02/22/2021 11:30 AM [facility] [place] [facility] None  02/22/2021 12:10 PM Lazaro Arms, MD [facility]  03/22/2021  9:00 AM [facility] [place] [facility] None  03/22/2021  9:30 AM Cheral Marker, CNM [facility]    Orders Placed This Encounter  Procedures   POC Urinalysis Dipstick OB    Myna Hidalgo, DO Attending Obstetrician & Gynecologist, [facility] [employer], [facility]

## 2021-02-10 ENCOUNTER — Other Ambulatory Visit: Payer: Self-pay

## 2021-02-19 ENCOUNTER — Other Ambulatory Visit: Payer: Self-pay | Admitting: Obstetrics & Gynecology

## 2021-02-19 DIAGNOSIS — O24410 Gestational diabetes mellitus in pregnancy, diet controlled: Secondary | ICD-10-CM

## 2021-02-22 ENCOUNTER — Ambulatory Visit (INDEPENDENT_AMBULATORY_CARE_PROVIDER_SITE_OTHER): Payer: BC Managed Care – PPO | Admitting: Obstetrics & Gynecology

## 2021-02-22 ENCOUNTER — Encounter: Payer: Self-pay | Admitting: Obstetrics & Gynecology

## 2021-02-22 ENCOUNTER — Ambulatory Visit (INDEPENDENT_AMBULATORY_CARE_PROVIDER_SITE_OTHER): Payer: BC Managed Care – PPO

## 2021-02-22 ENCOUNTER — Other Ambulatory Visit: Payer: Self-pay

## 2021-02-22 VITALS — BP 111/63 | HR 70 | Wt 179.5 lb

## 2021-02-22 DIAGNOSIS — O0990 Supervision of high risk pregnancy, unspecified, unspecified trimester: Secondary | ICD-10-CM

## 2021-02-22 DIAGNOSIS — O24410 Gestational diabetes mellitus in pregnancy, diet controlled: Secondary | ICD-10-CM

## 2021-02-22 DIAGNOSIS — F419 Anxiety disorder, unspecified: Secondary | ICD-10-CM

## 2021-02-22 DIAGNOSIS — O0993 Supervision of high risk pregnancy, unspecified, third trimester: Secondary | ICD-10-CM

## 2021-02-22 DIAGNOSIS — F32A Depression, unspecified: Secondary | ICD-10-CM

## 2021-02-22 LAB — POCT URINALYSIS DIPSTICK OB
Blood, UA: NEGATIVE
Glucose, UA: NEGATIVE
Ketones, UA: NEGATIVE
Leukocytes, UA: NEGATIVE
Nitrite, UA: NEGATIVE
POC,PROTEIN,UA: NEGATIVE

## 2021-02-22 NOTE — Progress Notes (Signed)
[place] 32+1 wks,cephalic,anterior placenta gr 0,AFI 13 cm,fhr 140 bpm,EFW 1894 g 37%

## 2021-02-22 NOTE — Progress Notes (Signed)
HIGH-RISK PREGNANCY VISIT Patient name: Donna Mcknight MRN [id number]  Date of birth: [birth year] Chief Complaint:   Routine Prenatal Visit, High Risk Gestation, and Pregnancy Ultrasound  History of Present Illness:   Donna Mcknight is a 25 y.o. G67P0000 female at [redacted]w[redacted]d with an Estimated Date of Delivery: 04/18/21 being seen today for ongoing management of a high-risk pregnancy complicated by A1DM.    Today she reports no complaints. Contractions: Not present. Vag. Bleeding: Scant.  Movement: Present. denies leaking of fluid.   Depression screen [facility] 2/9 01/08/2021 10/14/2020 08/17/2020 02/10/2020  Decreased Interest 0 0 0 1  Down, Depressed, Hopeless 0 0 0 1  PHQ - 2 Score 0 0 0 2  Altered sleeping 0 1 0 0  Tired, decreased energy 2 1 1  0  Change in appetite 1 3 2 1   Feeling bad or failure about yourself  0 0 0 0  Trouble concentrating 0 0 0 0  Moving slowly or fidgety/restless 0 0 0 0  Suicidal thoughts 0 0 0 0  PHQ-9 Score 3 5 3 3      GAD 7 : Generalized Anxiety Score 01/08/2021 10/14/2020 08/17/2020 02/10/2020  Nervous, Anxious, on Edge 1 0 1 1  Control/stop worrying 0 0 0 0  Worry too much - different things 0 0 0 0  Trouble relaxing 0 0 0 0  Restless 0 0 0 0  Easily annoyed or irritable 2 0 1 1  Afraid - awful might happen 0 0 0 0  Total GAD 7 Score 3 0 2 2     Review of Systems:   Pertinent items are noted in HPI Denies abnormal vaginal discharge w/ itching/odor/irritation, headaches, visual changes, shortness of breath, chest pain, abdominal pain, severe nausea/vomiting, or problems with urination or bowel movements unless otherwise stated above. Pertinent History Reviewed:  Reviewed past medical,surgical, social, obstetrical and family history.  Reviewed problem list, medications and allergies. Physical Assessment:   Vitals:   02/22/21 1217  BP: 111/63  Pulse: 70  Weight: 179 lb 8 oz (81.4 kg)  Body mass index is 29.42 kg/m.           Physical  Examination:   General appearance: alert, well appearing, and in no distress  Mental status: alert, oriented to person, place, and time  Skin: warm & dry   Extremities: Edema: None    Cardiovascular: normal heart rate noted  Respiratory: normal respiratory effort, no distress  Abdomen: gravid, soft, non-tender  Pelvic: Cervical exam deferred         Fetal Status: Fetal Heart Rate (bpm): 140 Fundal Height: 32 cm Movement: Present    Fetal Surveillance Testing today: sonogram EFW 37%   Chaperone: N/A    Results for orders placed or performed in visit on 02/22/21 (from the past 24 hour(s))  POC Urinalysis Dipstick OB   Collection Time: 02/22/21 12:19 PM  Result Value Ref Range   Color, UA     Clarity, UA     Glucose, UA Negative Negative   Bilirubin, UA     Ketones, UA neg    Spec Grav, UA     Blood, UA neg    pH, UA     POC,PROTEIN,UA Negative Negative, Trace, Small (1+), Moderate (2+), Large (3+), 4+   Urobilinogen, UA     Nitrite, UA neg    Leukocytes, UA Negative Negative   Appearance     Odor      Assessment & Plan:  High-risk pregnancy:  G1P0000 at [redacted]w[redacted]d with an Estimated Date of Delivery: 04/18/21   1) A1DM good CBG, decrease to 3 per day, fasting every day then 2/3 meals,   2) Constipation improved, cont miraLax  Meds: No orders of the defined types were placed in this encounter.   Labs/procedures today: U/S  Treatment Plan:  routine EFW 36 weeks  Reviewed: Preterm labor symptoms and general obstetric precautions including but not limited to vaginal bleeding, contractions, leaking of fluid and fetal movement were reviewed in detail with the patient.  All questions were answered. Does have home bp cuff. Office bp cuff given: not applicable. Check bp weekly, let us know if consistently >140 and/or >90.  Follow-up: Return in about 2 weeks (around 03/08/2021) for HROB.   Future Appointments  Date Time Provider Department Center  03/22/2021  9:00 AM [facility] - [facility]  [place] [facility] None  03/22/2021  9:30 AM Cheral Marker, CNM [facility]    Orders Placed This Encounter  Procedures   POC Urinalysis Dipstick OB   Lazaro Arms  02/22/2021 12:41 PM

## 2021-03-08 ENCOUNTER — Ambulatory Visit (INDEPENDENT_AMBULATORY_CARE_PROVIDER_SITE_OTHER): Payer: BC Managed Care – PPO | Admitting: Obstetrics & Gynecology

## 2021-03-08 ENCOUNTER — Other Ambulatory Visit: Payer: Self-pay

## 2021-03-08 VITALS — BP 110/67 | HR 79 | Wt 181.2 lb

## 2021-03-08 DIAGNOSIS — O24410 Gestational diabetes mellitus in pregnancy, diet controlled: Secondary | ICD-10-CM

## 2021-03-08 DIAGNOSIS — O0993 Supervision of high risk pregnancy, unspecified, third trimester: Secondary | ICD-10-CM

## 2021-03-08 DIAGNOSIS — Z3A34 34 weeks gestation of pregnancy: Secondary | ICD-10-CM

## 2021-03-08 NOTE — Progress Notes (Signed)
 HIGH-RISK PREGNANCY VISIT Patient name: Donna Mcknight MRN [id number]  Date of birth: 08/04/1995 Chief Complaint:   Routine Prenatal Visit  History of Present Illness:   Donna Mcknight is a 25 y.o. G1P0000 female at [redacted]w[redacted]d with an Estimated Date of Delivery: 04/18/21 being seen today for ongoing management of a high-risk pregnancy complicated by diabetes mellitus A1DM.    Well controlled with diet- now only checking 3x daily per Dr. Eure  Today she reports no complaints.   Contractions: Not present. Vag. Bleeding: None.  Movement: Present. denies leaking of fluid.   Depression screen [facility] 2/9 01/08/2021 10/14/2020 08/17/2020 02/10/2020  Decreased Interest 0 0 0 1  Down, Depressed, Hopeless 0 0 0 1  PHQ - 2 Score 0 0 0 2  Altered sleeping 0 1 0 0  Tired, decreased energy 2 1 1 0  Change in appetite 1 3 2 1  Feeling bad or failure about yourself  0 0 0 0  Trouble concentrating 0 0 0 0  Moving slowly or fidgety/restless 0 0 0 0  Suicidal thoughts 0 0 0 0  PHQ-9 Score 3 5 3 3     Current Outpatient Medications  Medication Instructions   aspirin 81 mg, Oral, Daily, Swallow whole.   Blood Glucose Monitoring Suppl (ONETOUCH VERIO) w/Device KIT Use as directed to check blood sugar 4 times daily   glucose blood test strip Use as instructed to check blood sugar 4 times daily   Lancets (ONETOUCH ULTRASOFT) lancets Use as directed to check blood sugar 4 times daily   omeprazole (PRILOSEC) 20 mg, Oral, Daily, 1 tablet a day   polyethylene glycol powder (GLYCOLAX/MIRALAX) 17 GM/SCOOP powder 1 scoop daily or as needed   Prenatal Vit-Fe Fumarate-FA (PRENATAL VITAMIN PO) Oral     Review of Systems:   Pertinent items are noted in HPI Denies abnormal vaginal discharge w/ itching/odor/irritation, headaches, visual changes, shortness of breath, chest pain, abdominal pain, severe nausea/vomiting, or problems with urination or bowel movements unless otherwise stated above. Pertinent  History Reviewed:  Reviewed past medical,surgical, social, obstetrical and family history.  Reviewed problem list, medications and allergies. Physical Assessment:   Vitals:   03/08/21 1356  BP: 110/67  Pulse: 79  Weight: 181 lb 3.2 oz (82.2 kg)  Body mass index is 29.69 kg/m.           Physical Examination:   General appearance: alert, well appearing, and in no distress  Mental status: normal mood, behavior, speech, dress, motor activity, and thought processes  Skin: warm & dry   Extremities: Edema: None    Cardiovascular: normal heart rate noted  Respiratory: normal respiratory effort, no distress  Abdomen: gravid, soft, non-tender  Pelvic: Cervical exam deferred         Fetal Status: Fetal Heart Rate (bpm): 140 Fundal Height: 32 cm Movement: Present   Suspect minimal change in uterine size due to low position of fetus  Fetal Surveillance Testing today: doppler   Chaperone: N/A    No results found for this or any previous visit (from the past 24 hour(s)).   Assessment & Plan:  High-risk pregnancy: G1P0000 at [redacted]w[redacted]d with an Estimated Date of Delivery: 04/18/21   1) GDMA1 -well controlled with diet -growth scan scheduled in 2wks  Meds: No orders of the defined types were placed in this encounter.   Labs/procedures today: none  Treatment Plan:  continue with OB care as above  Reviewed: Preterm labor symptoms and general obstetric precautions   HIGH-RISK PREGNANCY VISIT Patient name: Donna Mcknight MRN [id number]  Date of birth: 1995-09-01 Chief Complaint:   Routine Prenatal Visit  History of Present Illness:   Donna Mcknight is a 25 y.o. G63P0000 female at 84w1dwith an Estimated Date of Delivery: 04/18/21 being seen today for ongoing management of a high-risk pregnancy complicated by diabetes mellitus A1DM.    Well controlled with diet- now only checking 3x daily per Dr. EElonda Husky Today she reports no complaints.   Contractions: Not present. Vag. Bleeding: None.  Movement: Present. denies leaking of fluid.   [facility] 01/08/2021 10/14/2020 08/17/2020 02/10/2020  Decreased Interest 0 0 0 1  Down, Depressed, Hopeless 0 0 0 1  PHQ - 2 Score 0 0 0 2  Altered sleeping 0 1 0 0  Tired, decreased energy _0 0  Change in appetite _1 Feeling bad or failure about yourself  0 0 0 0  Trouble concentrating 0 0 0 0  Moving slowly or fidgety/restless 0 0 0 0  Suicidal thoughts 0 0 0 0  PHQ-9 Score _2 Current Outpatient Medications  Medication Instructions   aspirin 81 mg, Oral, Daily, Swallow whole.   Blood Glucose Monitoring Suppl (ONETOUCH VERIO) w/Device KIT Use as directed to check blood sugar 4 times daily   glucose blood test strip Use as instructed to check blood sugar 4 times daily   Lancets (ONETOUCH ULTRASOFT) lancets Use as directed to check blood sugar 4 times daily   omeprazole (PRILOSEC) 20 mg, Oral, Daily, 1 tablet a day   polyethylene glycol powder (GLYCOLAX/MIRALAX) 17 GM/SCOOP powder 1 scoop daily or as needed   Prenatal Vit-Fe Fumarate-FA (PRENATAL VITAMIN PO) Oral     Review of Systems:   Pertinent items are noted in HPI Denies abnormal vaginal discharge w/ itching/odor/irritation, headaches, visual changes, shortness of breath, chest pain, abdominal pain, severe nausea/vomiting, or problems with urination or bowel movements unless otherwise stated above. Pertinent  History Reviewed:  Reviewed past medical,surgical, social, obstetrical and family history.  Reviewed problem list, medications and allergies. Physical Assessment:   Vitals:   03/08/21 1356  BP: 110/67  Pulse: 79  Weight: 181 lb 3.2 oz (82.2 kg)  Body mass index is 29.69 kg/m.           Physical Examination:   General appearance: alert, well appearing, and in no distress  Mental status: normal mood, behavior, speech, dress, motor activity, and thought processes  Skin: warm & dry   Extremities: Edema: None    Cardiovascular: normal heart rate noted  Respiratory: normal respiratory effort, no distress  Abdomen: gravid, soft, non-tender  Pelvic: Cervical exam deferred         Fetal Status: Fetal Heart Rate (bpm): 140 Fundal Height: 32 cm Movement: Present   Suspect minimal change in uterine size due to low position of fetus  Fetal Surveillance Testing today: doppler   Chaperone: N/A    No results found for this or any previous visit (from the past 24 hour(s)).   Assessment & Plan:  High-risk pregnancy: G1P0000 at 331w1dith an Estimated Date of Delivery: 04/18/21   1) GDMA1 -well controlled with diet -growth scan scheduled in 2wks  Meds: No orders of the defined types were placed in this encounter.   Labs/procedures today: none  Treatment Plan:  continue with OB care as above  Reviewed: Preterm labor symptoms and general obstetric precautions

## 2021-03-19 ENCOUNTER — Other Ambulatory Visit: Payer: Self-pay | Admitting: Obstetrics & Gynecology

## 2021-03-19 DIAGNOSIS — O24410 Gestational diabetes mellitus in pregnancy, diet controlled: Secondary | ICD-10-CM

## 2021-03-22 ENCOUNTER — Encounter: Payer: Self-pay | Admitting: Women's Health

## 2021-03-22 ENCOUNTER — Other Ambulatory Visit (HOSPITAL_COMMUNITY)
Admission: RE | Admit: 2021-03-22 | Discharge: 2021-03-22 | Disposition: A | Discharge status: Home / Self Care | Payer: BC Managed Care – PPO | Source: Ambulatory Visit | Attending: Women's Health | Admitting: Women's Health

## 2021-03-22 ENCOUNTER — Ambulatory Visit (INDEPENDENT_AMBULATORY_CARE_PROVIDER_SITE_OTHER): Payer: BC Managed Care – PPO

## 2021-03-22 ENCOUNTER — Other Ambulatory Visit: Payer: Self-pay

## 2021-03-22 ENCOUNTER — Ambulatory Visit (INDEPENDENT_AMBULATORY_CARE_PROVIDER_SITE_OTHER): Payer: BC Managed Care – PPO | Admitting: Women's Health

## 2021-03-22 VITALS — BP 106/72 | HR 71 | Wt 181.8 lb

## 2021-03-22 DIAGNOSIS — O24410 Gestational diabetes mellitus in pregnancy, diet controlled: Secondary | ICD-10-CM

## 2021-03-22 DIAGNOSIS — O0990 Supervision of high risk pregnancy, unspecified, unspecified trimester: Secondary | ICD-10-CM

## 2021-03-22 DIAGNOSIS — Z3A36 36 weeks gestation of pregnancy: Secondary | ICD-10-CM | POA: Insufficient documentation

## 2021-03-22 DIAGNOSIS — F32A Depression, unspecified: Secondary | ICD-10-CM

## 2021-03-22 DIAGNOSIS — Z1389 Encounter for screening for other disorder: Secondary | ICD-10-CM

## 2021-03-22 DIAGNOSIS — F419 Anxiety disorder, unspecified: Secondary | ICD-10-CM

## 2021-03-22 DIAGNOSIS — O0993 Supervision of high risk pregnancy, unspecified, third trimester: Secondary | ICD-10-CM

## 2021-03-22 DIAGNOSIS — Z331 Pregnant state, incidental: Secondary | ICD-10-CM

## 2021-03-22 LAB — POCT URINALYSIS DIPSTICK OB
Blood, UA: NEGATIVE
Glucose, UA: NEGATIVE
Ketones, UA: NEGATIVE
Leukocytes, UA: NEGATIVE
Nitrite, UA: NEGATIVE
POC,PROTEIN,UA: NEGATIVE

## 2021-03-22 LAB — OB RESULTS CONSOLE GC/CHLAMYDIA: Gonorrhea: NEGATIVE

## 2021-03-22 NOTE — Progress Notes (Signed)
[place] 36+1 wks,cephalic,anterior placenta gr 3,AFI 13.7 cm,fhr 152 bpm,EFW 2819 g 47%

## 2021-03-22 NOTE — Progress Notes (Signed)
HIGH-RISK PREGNANCY VISIT Patient name: Donna Mcknight MRN [id number]  Date of birth: [birth year] Chief Complaint:   Routine Prenatal Visit (cultures)  History of Present Illness:   Donna Mcknight is a 25 y.o. G75P0000 female at [redacted]w[redacted]d with an Estimated Date of Delivery: 04/18/21 being seen today for ongoing management of a high-risk pregnancy complicated by diabetes mellitus A1DM.    Today she reports  all sugars wnl except fasting this am was 99 (ate carb smart ice cream w/ fruit last night) . Contractions: Not present.  .  Movement: Present. denies leaking of fluid.   Depression screen [facility] 2/9 01/08/2021 10/14/2020 08/17/2020 02/10/2020  Decreased Interest 0 0 0 1  Down, Depressed, Hopeless 0 0 0 1  PHQ - 2 Score 0 0 0 2  Altered sleeping 0 1 0 0  Tired, decreased energy 2 1 1  0  Change in appetite 1 3 2 1   Feeling bad or failure about yourself  0 0 0 0  Trouble concentrating 0 0 0 0  Moving slowly or fidgety/restless 0 0 0 0  Suicidal thoughts 0 0 0 0  PHQ-9 Score 3 5 3 3      GAD 7 : Generalized Anxiety Score 01/08/2021 10/14/2020 08/17/2020 02/10/2020  Nervous, Anxious, on Edge 1 0 1 1  Control/stop worrying 0 0 0 0  Worry too much - different things 0 0 0 0  Trouble relaxing 0 0 0 0  Restless 0 0 0 0  Easily annoyed or irritable 2 0 1 1  Afraid - awful might happen 0 0 0 0  Total GAD 7 Score 3 0 2 2     Review of Systems:   Pertinent items are noted in HPI Denies abnormal vaginal discharge w/ itching/odor/irritation, headaches, visual changes, shortness of breath, chest pain, abdominal pain, severe nausea/vomiting, or problems with urination or bowel movements unless otherwise stated above. Pertinent History Reviewed:  Reviewed past medical,surgical, social, obstetrical and family history.  Reviewed problem list, medications and allergies. Physical Assessment:   Vitals:   03/22/21 0954  BP: 106/72  Pulse: 71  Weight: 181 lb 12.8 oz (82.5 kg)  Body mass  index is 29.79 kg/m.           Physical Examination:   General appearance: alert, well appearing, and in no distress  Mental status: alert, oriented to person, place, and time  Skin: warm & dry   Extremities: Edema: None    Cardiovascular: normal heart rate noted  Respiratory: normal respiratory effort, no distress  Abdomen: gravid, soft, non-tender  Pelvic: Cervical exam performed  Dilation: 1 Effacement (%): Thick Station: -2  Fetal Status: Fetal Heart Rate (bpm): 152 u/s   Movement: Present Presentation: Vertex  Fetal Surveillance Testing today: [place] 36+1 wks,cephalic,anterior placenta gr 3,AFI 13.7 cm,fhr 152 bpm,EFW 2819 g 47%    Chaperone: Faith Rogue    Results for orders placed or performed in visit on 03/22/21 (from the past 24 hour(s))  POC Urinalysis Dipstick OB   Collection Time: 03/22/21 10:08 AM  Result Value Ref Range   Color, UA     Clarity, UA     Glucose, UA Negative Negative   Bilirubin, UA     Ketones, UA neg    Spec Grav, UA     Blood, UA neg    pH, UA     POC,PROTEIN,UA Negative Negative, Trace, Small (1+), Moderate (2+), Large (3+), 4+   Urobilinogen, UA     Nitrite, UA neg  Leukocytes, UA Negative Negative   Appearance     Odor      Assessment & Plan:  High-risk pregnancy: G1P0000 at [redacted]w[redacted]d with an Estimated Date of Delivery: 04/18/21   1) A1DM, stable, EFW today 47%  Meds: No orders of the defined types were placed in this encounter.   Labs/procedures today: GBS, GC/CT, SVE, and U/S  Treatment Plan:   Deliver @ 39-40wks:____   Reviewed: Preterm labor symptoms and general obstetric precautions including but not limited to vaginal bleeding, contractions, leaking of fluid and fetal movement were reviewed in detail with the patient.  All questions were answered. Does have home bp cuff. Office bp cuff given: not applicable. Check bp weekly, let us know if consistently >140 and/or >90.  Follow-up: Return for weekly, HROB, MD or CNM.   No  future appointments.  Orders Placed This Encounter  Procedures   Culture, beta strep (group b only)   POC Urinalysis Dipstick OB   Cheral Marker CNM, [facility] 03/22/2021 10:33 AM

## 2021-03-22 NOTE — Patient Instructions (Signed)
Katia, thank you for choosing our office today! We appreciate the opportunity to meet your healthcare needs. You may receive a short survey by mail, e-mail, or through [employer]. If you are happy with your care we would appreciate if you could take just a few minutes to complete the survey questions. We read all of your comments and take your feedback very seriously. Thank you again for choosing our office.  Center for [employer] Team at [facility]  [facility] at [facility] ([address] [place], [place] [address]) Entrance C, located off of [address] Free 24/7 valet parking   CLASSES: Go to [email or web address] to register for classes (childbirth, breastfeeding, waterbirth, infant CPR, daddy bootcamp, etc.)  Call the office [phone]) or go to [facility] if: You begin to have strong, frequent contractions Your water breaks.  Sometimes it is a big gush of fluid, sometimes it is just a trickle that keeps getting your panties wet or running down your legs You have vaginal bleeding.  It is normal to have a small amount of spotting if your cervix was checked.  You don't feel your baby moving like normal.  If you don't, get you something to eat and drink and lay down and focus on feeling your baby move.   If your baby is still not moving like normal, you should call the office or go to [facility].  Call the office [phone]) or go to [facility] hospital for these signs of pre-eclampsia: Severe headache that does not go away with Tylenol Visual changes- seeing spots, double, blurred vision Pain under your right breast or upper abdomen that does not go away with Tums or heartburn medicine Nausea and/or vomiting Severe swelling in your hands, feet, and face   Tdap Vaccine It is recommended that you get the Tdap vaccine during the third trimester of EACH pregnancy to help protect your baby from getting pertussis (whooping cough) 27-36 weeks is the BEST time to do  this so that you can pass the protection on to your baby. During pregnancy is better than after pregnancy, but if you are unable to get it during pregnancy it will be offered at the hospital.  You can get this vaccine with [place], at the health department, your family doctor, or some local pharmacies Everyone who will be around your baby should also be up-to-date on their vaccines before the baby comes. Adults (who are not pregnant) only need 1 dose of Tdap during adulthood.   [facility] Pediatricians/Family Doctors [facility]): [address] Colette Ribas, [phone]           [facility]: [address], [phone]                [facility]): [address], [phone] (call to ask if accepting patients) [facility]: [address], [place], [phone]    [facility] Pediatricians/Family Doctors [facility]): [phone], [phone] [facility]: [address] [place], [phone] [facility]: [address], [phone]  [facility] Doctors  [facility]): [phone] [facility]: [address], [phone]   [facility] Doctors [facility]: [address], [phone]  [facility] Family Doctors  [facility]: [phone], [phone]  Home Blood Pressure Monitoring for Patients   Your provider has recommended that you check your  blood pressure (BP) at least once a week at home. If you do not have a blood pressure cuff at home, one will be provided for you. Contact your provider if you have not received your monitor within 1 week.   Helpful Tips for Accurate Home Blood Pressure Checks  Don't smoke, exercise, or drink caffeine 30 minutes before checking your BP Use the restroom before checking your BP (a full bladder can raise your  pressure) Relax in a comfortable upright chair Feet on the ground Left arm resting comfortably on a flat surface at the level of your heart Legs uncrossed Back supported Sit quietly and don't talk Place the cuff on your bare arm Adjust snuggly, so that only two fingertips can fit between your skin and the top of the cuff Check 2 readings separated by at least one minute Keep a log of your BP readings For a visual, please reference this diagram: http://ccnc.care/bpdiagram  Provider Name: [facility]     Phone: [phone]  Zone 1: ALL CLEAR  Continue to monitor your symptoms:  BP reading is less than 140 (top number) or less than 90 (bottom number)  No right upper stomach pain No headaches or seeing spots No feeling nauseated or throwing up No swelling in face and hands  Zone 2: CAUTION Call your doctor's office for any of the following:  BP reading is greater than 140 (top number) or greater than 90 (bottom number)  Stomach pain under your ribs in the middle or right side Headaches or seeing spots Feeling nauseated or throwing up Swelling in face and hands  Zone 3: EMERGENCY  Seek immediate medical care if you have any of the following:  BP reading is greater than160 (top number) or greater than 110 (bottom number) Severe headaches not improving with Tylenol Serious difficulty catching your breath Any worsening symptoms from Zone 2  Preterm Labor and Birth Information  The normal length of a pregnancy is 39-41 weeks. Preterm labor is when labor starts before 37 completed weeks of pregnancy. What are the risk factors for preterm labor? Preterm labor is more likely to occur in women who: Have certain infections during pregnancy such as a bladder infection, sexually transmitted infection, or infection inside the uterus (chorioamnionitis). Have a shorter-than-normal cervix. Have gone into preterm labor before. Have had surgery on their cervix. Are younger than age 17  or older than age 35. Are African American. Are pregnant with twins or multiple babies (multiple gestation). Take street drugs or smoke while pregnant. Do not gain enough weight while pregnant. Became pregnant shortly after having been pregnant. What are the symptoms of preterm labor? Symptoms of preterm labor include: Cramps similar to those that can happen during a menstrual period. The cramps may happen with diarrhea. Pain in the abdomen or lower back. Regular uterine contractions that may feel like tightening of the abdomen. A feeling of increased pressure in the pelvis. Increased watery or bloody mucus discharge from the vagina. Water breaking (ruptured amniotic sac). Why is it important to recognize signs of preterm labor? It is important to recognize signs of preterm labor because babies who are born prematurely may not be fully developed. This can put them at an increased risk for: Long-term (chronic) heart and lung problems. Difficulty immediately after birth with regulating body systems, including blood sugar, body temperature, heart rate, and breathing rate. Bleeding in the brain. Cerebral palsy. Learning difficulties. Death. These risks are highest for babies who are born before 34 weeks   of pregnancy. How is preterm labor treated? Treatment depends on the length of your pregnancy, your condition, and the health of your baby. It may involve: Having a stitch (suture) placed in your cervix to prevent your cervix from opening too early (cerclage). Taking or being given medicines, such as: Hormone medicines. These may be given early in pregnancy to help support the pregnancy. Medicine to stop contractions. Medicines to help mature the baby's lungs. These may be prescribed if the risk of delivery is high. Medicines to prevent your baby from developing cerebral palsy. If the labor happens before 34 weeks of pregnancy, you may need to stay in the hospital. What should I do if I  think I am in preterm labor? If you think that you are going into preterm labor, call your health care provider right away. How can I prevent preterm labor in future pregnancies? To increase your chance of having a full-term pregnancy: Do not use any tobacco products, such as cigarettes, chewing tobacco, and e-cigarettes. If you need help quitting, ask your health care provider. Do not use street drugs or medicines that have not been prescribed to you during your pregnancy. Talk with your health care provider before taking any herbal supplements, even if you have been taking them regularly. Make sure you gain a healthy amount of weight during your pregnancy. Watch for infection. If you think that you might have an infection, get it checked right away. Make sure to tell your health care provider if you have gone into preterm labor before. This information is not intended to replace advice given to you by your health care provider. Make sure you discuss any questions you have with your health care provider. Document Revised: 07/20/2018 Document Reviewed: 08/19/2015 Elsevier Patient Education  2020 Elsevier Inc.   

## 2021-03-23 LAB — CERVICOVAGINAL ANCILLARY ONLY
Chlamydia: NEGATIVE
Comment: NEGATIVE
Comment: NORMAL
Neisseria Gonorrhea: NEGATIVE

## 2021-03-26 LAB — CULTURE, BETA STREP (GROUP B ONLY): Strep Gp B Culture: NEGATIVE

## 2021-03-29 ENCOUNTER — Other Ambulatory Visit: Payer: Self-pay

## 2021-03-29 ENCOUNTER — Ambulatory Visit (INDEPENDENT_AMBULATORY_CARE_PROVIDER_SITE_OTHER): Payer: BC Managed Care – PPO | Admitting: Obstetrics & Gynecology

## 2021-03-29 ENCOUNTER — Encounter: Payer: Self-pay | Admitting: Obstetrics & Gynecology

## 2021-03-29 VITALS — BP 107/66 | HR 85 | Wt 186.0 lb

## 2021-03-29 DIAGNOSIS — O24410 Gestational diabetes mellitus in pregnancy, diet controlled: Secondary | ICD-10-CM

## 2021-03-29 DIAGNOSIS — O0990 Supervision of high risk pregnancy, unspecified, unspecified trimester: Secondary | ICD-10-CM

## 2021-03-29 NOTE — Progress Notes (Signed)
HIGH-RISK PREGNANCY VISIT Patient name: Donna Mcknight MRN [id number]  Date of birth: December 04, 1995 Chief Complaint:   Routine Prenatal Visit  History of Present Illness:   Donna Mcknight is a 25 y.o. G52P0000 female at [redacted]w[redacted]d with an Estimated Date of Delivery: 04/18/21 being seen today for ongoing management of a high-risk pregnancy complicated by A1DM.    Today she reports no complaints. Contractions: Not present.  .  Movement: Present. denies leaking of fluid.   Depression screen [facility] 2/9 01/08/2021 10/14/2020 08/17/2020 02/10/2020  Decreased Interest 0 0 0 1  Down, Depressed, Hopeless 0 0 0 1  PHQ - 2 Score 0 0 0 2  Altered sleeping 0 1 0 0  Tired, decreased energy 2 1 1  0  Change in appetite 1 3 2 1   Feeling bad or failure about yourself  0 0 0 0  Trouble concentrating 0 0 0 0  Moving slowly or fidgety/restless 0 0 0 0  Suicidal thoughts 0 0 0 0  PHQ-9 Score 3 5 3 3      GAD 7 : Generalized Anxiety Score 01/08/2021 10/14/2020 08/17/2020 02/10/2020  Nervous, Anxious, on Edge 1 0 1 1  Control/stop worrying 0 0 0 0  Worry too much - different things 0 0 0 0  Trouble relaxing 0 0 0 0  Restless 0 0 0 0  Easily annoyed or irritable 2 0 1 1  Afraid - awful might happen 0 0 0 0  Total GAD 7 Score 3 0 2 2     Review of Systems:   Pertinent items are noted in HPI Denies abnormal vaginal discharge w/ itching/odor/irritation, headaches, visual changes, shortness of breath, chest pain, abdominal pain, severe nausea/vomiting, or problems with urination or bowel movements unless otherwise stated above. Pertinent History Reviewed:  Reviewed past medical,surgical, social, obstetrical and family history.  Reviewed problem list, medications and allergies. Physical Assessment:   Vitals:   03/29/21 1033  BP: 107/66  Pulse: 85  Weight: 186 lb (84.4 kg)  Body mass index is 30.48 kg/m.           Physical Examination:   General appearance: alert, well appearing, and in no  distress  Mental status: alert, oriented to person, place, and time  Skin: warm & dry   Extremities: Edema: None    Cardiovascular: normal heart rate noted  Respiratory: normal respiratory effort, no distress  Abdomen: gravid, soft, non-tender  Pelvic: Cervical exam deferred         Fetal Status:     Movement: Present    Fetal Surveillance Testing today:    Chaperone: N/A    No results found for this or any previous visit (from the past 24 hour(s)).  Assessment & Plan:  High-risk pregnancy: G1P0000 at 107w1d with an Estimated Date of Delivery: 04/18/21   1) A1DM, stable, EFW 47%   Meds: No orders of the defined types were placed in this encounter.   Labs/procedures today: none  Treatment Plan:  IOL 40 weeks if undelivered  Reviewed: Term labor symptoms and general obstetric precautions including but not limited to vaginal bleeding, contractions, leaking of fluid and fetal movement were reviewed in detail with the patient.  All questions were answered. Does have home bp cuff. Office bp cuff given: not applicable. Check bp weekly, let us know if consistently >140 and/or >90.  Follow-up: No follow-ups on file.   Future Appointments  Date Time Provider Department Center  04/06/2021  2:30 PM Cheral Marker,  CNM [facility]  04/13/2021  1:50 PM Cheral Marker, CNM [facility]    No orders of the defined types were placed in this encounter.  Amaryllis Dyke Treva Huyett  03/29/2021 11:01 AM

## 2021-04-06 ENCOUNTER — Other Ambulatory Visit: Payer: Self-pay

## 2021-04-06 ENCOUNTER — Ambulatory Visit (INDEPENDENT_AMBULATORY_CARE_PROVIDER_SITE_OTHER): Payer: BC Managed Care – PPO | Admitting: Women's Health

## 2021-04-06 ENCOUNTER — Encounter: Payer: Self-pay | Admitting: Women's Health

## 2021-04-06 VITALS — BP 125/76 | HR 74 | Wt 186.0 lb

## 2021-04-06 DIAGNOSIS — O0990 Supervision of high risk pregnancy, unspecified, unspecified trimester: Secondary | ICD-10-CM

## 2021-04-06 DIAGNOSIS — O24410 Gestational diabetes mellitus in pregnancy, diet controlled: Secondary | ICD-10-CM

## 2021-04-06 DIAGNOSIS — O0993 Supervision of high risk pregnancy, unspecified, third trimester: Secondary | ICD-10-CM

## 2021-04-06 NOTE — Progress Notes (Signed)
HIGH-RISK PREGNANCY VISIT Patient name: Donna Mcknight MRN [id number]  Date of birth: Jan 17, 1996 Chief Complaint:   Routine Prenatal Visit  History of Present Illness:   Donna Mcknight is a 25 y.o. G66P0000 female at [redacted]w[redacted]d with an Estimated Date of Delivery: 04/18/21 being seen today for ongoing management of a high-risk pregnancy complicated by diabetes mellitus A1DM.    Today she reports  all sugars wnl . Contractions: Not present. Vag. Bleeding: None.  Movement: Present. denies leaking of fluid.   Depression screen [facility] 2/9 01/08/2021 10/14/2020 08/17/2020 02/10/2020  Decreased Interest 0 0 0 1  Down, Depressed, Hopeless 0 0 0 1  PHQ - 2 Score 0 0 0 2  Altered sleeping 0 1 0 0  Tired, decreased energy 2 1 1  0  Change in appetite 1 3 2 1   Feeling bad or failure about yourself  0 0 0 0  Trouble concentrating 0 0 0 0  Moving slowly or fidgety/restless 0 0 0 0  Suicidal thoughts 0 0 0 0  PHQ-9 Score 3 5 3 3      GAD 7 : Generalized Anxiety Score 01/08/2021 10/14/2020 08/17/2020 02/10/2020  Nervous, Anxious, on Edge 1 0 1 1  Control/stop worrying 0 0 0 0  Worry too much - different things 0 0 0 0  Trouble relaxing 0 0 0 0  Restless 0 0 0 0  Easily annoyed or irritable 2 0 1 1  Afraid - awful might happen 0 0 0 0  Total GAD 7 Score 3 0 2 2     Review of Systems:   Pertinent items are noted in HPI Denies abnormal vaginal discharge w/ itching/odor/irritation, headaches, visual changes, shortness of breath, chest pain, abdominal pain, severe nausea/vomiting, or problems with urination or bowel movements unless otherwise stated above. Pertinent History Reviewed:  Reviewed past medical,surgical, social, obstetrical and family history.  Reviewed problem list, medications and allergies. Physical Assessment:   Vitals:   04/06/21 1438  BP: 125/76  Pulse: 74  Weight: 186 lb (84.4 kg)  Body mass index is 30.48 kg/m.           Physical Examination:   General appearance:  alert, well appearing, and in no distress  Mental status: alert, oriented to person, place, and time  Skin: warm & dry   Extremities: Edema: None    Cardiovascular: normal heart rate noted  Respiratory: normal respiratory effort, no distress  Abdomen: gravid, soft, non-tender  Pelvic: Cervical exam performed  Dilation: 1.5 Effacement (%): 80 Station: -2  Fetal Status: Fetal Heart Rate (bpm): 145 Fundal Height: 38 cm Movement: Present Presentation: Vertex  Fetal Surveillance Testing today: doppler   Chaperone: Faith Rogue    No results found for this or any previous visit (from the past 24 hour(s)).  Assessment & Plan:  High-risk pregnancy: G1P0000 at [redacted]w[redacted]d with an Estimated Date of Delivery: 04/18/21   1) A1DM, stable, EFW 47% @ 36wk  Meds: No orders of the defined types were placed in this encounter.  Labs/procedures today: SVE  Treatment Plan:  IOL @ 40wks  Reviewed: Term labor symptoms and general obstetric precautions including but not limited to vaginal bleeding, contractions, leaking of fluid and fetal movement were reviewed in detail with the patient.  All questions were answered. Does have home bp cuff. Office bp cuff given: not applicable. Check bp weekly, let us know if consistently >140 and/or >90.  Follow-up: Return for As scheduled.   Future Appointments  Date Time  Provider Department Center  04/13/2021  1:50 PM Cheral Marker, CNM [facility]    No orders of the defined types were placed in this encounter.  Cheral Marker CNM, [facility] 04/06/2021 3:07 PM

## 2021-04-06 NOTE — Patient Instructions (Signed)
[place], thank you for choosing our office today! We appreciate the opportunity to meet your healthcare needs. You may receive a short survey by mail, e-mail, or through [employer]. If you are happy with your care we would appreciate if you could take just a few minutes to complete the survey questions. We read all of your comments and take your feedback very seriously. Thank you again for choosing our office.  Center for [employer] Team at [facility]  [facility] at [facility] ([address] [place], [place] [address]) Entrance C, located off of [address] Free 24/7 valet parking   CLASSES: Go to [email or web address] to register for classes (childbirth, breastfeeding, waterbirth, infant CPR, daddy bootcamp, etc.)  Call the office [phone]) or go to [facility] if: You begin to have strong, frequent contractions Your water breaks.  Sometimes it is a big gush of fluid, sometimes it is just a trickle that keeps getting your panties wet or running down your legs You have vaginal bleeding.  It is normal to have a small amount of spotting if your cervix was checked.  You don't feel your baby moving like normal.  If you don't, get you something to eat and drink and lay down and focus on feeling your baby move.   If your baby is still not moving like normal, you should call the office or go to [facility].  Call the office [phone]) or go to [facility] hospital for these signs of pre-eclampsia: Severe headache that does not go away with Tylenol Visual changes- seeing spots, double, blurred vision Pain under your right breast or upper abdomen that does not go away with Tums or heartburn medicine Nausea and/or vomiting Severe swelling in your hands, feet, and face   [facility] Pediatricians/Family Doctors [facility]): [address] Colette Ribas, [phone]           [facility]: [address], [phone]                 [facility]): [address], [phone] (call to ask if accepting patients) [facility] Department: [address] [place], [phone]    [facility] Pediatricians/Family Doctors [facility]): [address], [phone] [facility]: [address] [place], [phone] [facility]: [address], [phone]  [facility] Doctors  [facility]): [phone] [facility]: [address] [phone]   [facility] Doctors [facility]: [address] [phone]  [facility] Doctors  [facility]: [phone], [phone]  Home Blood Pressure Monitoring for Patients   Your provider has recommended that you check your blood pressure (BP) at least once a week at home. If you do not have a blood pressure cuff at home, one will be provided for you. Contact your provider if you have not received your monitor within 1 week.   Helpful Tips for Accurate Home Blood Pressure Checks  Don't smoke, exercise, or drink caffeine 30 minutes before checking your BP Use the restroom before checking your BP (a full bladder can raise your pressure) Relax in a comfortable upright chair Feet on the ground Left arm resting comfortably on a flat surface at the level of your heart Legs uncrossed Back supported Sit quietly and don't talk Place the cuff on your bare arm Adjust snuggly, so that only two fingertips  can fit between your skin and the top of the cuff Check 2 readings separated by at least one minute Keep a log of your BP readings For a visual, please reference this diagram: http://ccnc.care/bpdiagram  Provider Name: [facility]     Phone: [phone]  Zone 1: ALL CLEAR  Continue to monitor your symptoms:  BP reading is less than 140 (top number) or less than 90 (bottom number)  No right  upper stomach pain No headaches or seeing spots No feeling nauseated or throwing up No swelling in face and hands  Zone 2: CAUTION Call your doctor's office for any of the following:  BP reading is greater than 140 (top number) or greater than 90 (bottom number)  Stomach pain under your ribs in the middle or right side Headaches or seeing spots Feeling nauseated or throwing up Swelling in face and hands  Zone 3: EMERGENCY  Seek immediate medical care if you have any of the following:  BP reading is greater than160 (top number) or greater than 110 (bottom number) Severe headaches not improving with Tylenol Serious difficulty catching your breath Any worsening symptoms from Zone 2   Braxton Hicks Contractions Contractions of the uterus can occur throughout pregnancy, but they are not always a sign that you are in labor. You may have practice contractions called Braxton Hicks contractions. These false labor contractions are sometimes confused with true labor. What are [place] contractions? Braxton Hicks contractions are tightening movements that occur in the muscles of the uterus before labor. Unlike true labor contractions, these contractions do not result in opening (dilation) and thinning of the cervix. Toward the end of pregnancy (32-34 weeks), Braxton Hicks contractions can happen more often and may become stronger. These contractions are sometimes difficult to tell apart from true labor because they can be very uncomfortable. You should not feel embarrassed if you go to the hospital with false labor. Sometimes, the only way to tell if you are in true labor is for your health care provider to look for changes in the cervix. The health care provider will do a physical exam and may monitor your contractions. If you are not in true labor, the exam should show that your cervix is not dilating and your water has not broken. If there are no other health problems associated with your  pregnancy, it is completely safe for you to be sent home with false labor. You may continue to have Braxton Hicks contractions until you go into true labor. How to tell the difference between true labor and false labor True labor Contractions last 30-70 seconds. Contractions become very regular. Discomfort is usually felt in the top of the uterus, and it spreads to the lower abdomen and low back. Contractions do not go away with walking. Contractions usually become more intense and increase in frequency. The cervix dilates and gets thinner. False labor Contractions are usually shorter and not as strong as true labor contractions. Contractions are usually irregular. Contractions are often felt in the front of the lower abdomen and in the groin. Contractions may go away when you walk around or change positions while lying down. Contractions get weaker and are shorter-lasting as time goes on. The cervix usually does not dilate or become thin. Follow these instructions at home:  Take over-the-counter and prescription medicines only as told by your health care provider. Keep up with your usual exercises and follow other instructions from your health care provider. Eat and drink lightly if you think  you are going into labor. If Braxton Hicks contractions are making you uncomfortable: Change your position from lying down or resting to walking, or change from walking to resting. Sit and rest in a tub of warm water. Drink enough fluid to keep your urine pale yellow. Dehydration may cause these contractions. Do slow and deep breathing several times an hour. Keep all follow-up prenatal visits as told by your health care provider. This is important. Contact a health care provider if: You have a fever. You have continuous pain in your abdomen. Get help right away if: Your contractions become stronger, more regular, and closer together. You have fluid leaking or gushing from your vagina. You pass  blood-tinged mucus (bloody show). You have bleeding from your vagina. You have low back pain that you never had before. You feel your babys head pushing down and causing pelvic pressure. Your baby is not moving inside you as much as it used to. Summary Contractions that occur before labor are called Braxton Hicks contractions, false labor, or practice contractions. Braxton Hicks contractions are usually shorter, weaker, farther apart, and less regular than true labor contractions. True labor contractions usually become progressively stronger and regular, and they become more frequent. Manage discomfort from [facility] contractions by changing position, resting in a warm bath, drinking plenty of water, or practicing deep breathing. This information is not intended to replace advice given to you by your health care provider. Make sure you discuss any questions you have with your health care provider. Document Revised: 03/10/2017 Document Reviewed: 08/11/2016 Elsevier Patient Education  [place].

## 2021-04-09 ENCOUNTER — Encounter: Payer: Self-pay | Admitting: Women's Health

## 2021-04-11 NOTE — L&D Delivery Note (Signed)
Delivery Note:   G1P1001 at [redacted]w[redacted]d  Admitting diagnosis: Gestational diabetes mellitus, class A1 [O24.410] Risks: A1GDM Onset of labor: 0358 IOL/Augmentation: Cytotec and OP Foley ROM: 1/8 @ 0451  Complete dilation at 04/18/2021  0903 Onset of pushing at 0905 FHR second stage 115  Analgesia /Anesthesia intrapartum:Local  Pushing in lithotomy position with CNM and L&D staff support, doula- [name],and [name] present for birth and supportive.  Delivery of a Live born female  Birth Weight: 7 lb 10 oz (3459 g) APGAR: 9, 9  Newborn Delivery   Birth date/time: 04/18/2021 09:45:00 Delivery type: Vaginal, Spontaneous      in cephalic presentation, position LOA. Nuchal cord noted, shoulders delivered with ease, baby delivered through by somersault maneuver. Body cord also noted. Baby delivered and placed onto mom's chest. Baby stimulated and suctioned, pink and crying. Delayed cord clamping. Placenta delivered with ease and then a large gush of blood came after the placenta. TXA and Methergine were given noted by PPH and fundal massage given. Bleeding quickly eased up and was stable. Patient hemodynamically stable and alert and oriented.   APGAR:1 min-9 , 5 min-9   Nuchal Cord: Yes  Cord double clamped after cessation of pulsation, cut by husband, [name].  Collection of cord blood for typing completed. Cord blood donation-None  Arterial cord blood sample-No    Placenta delivered-Spontaneous  with 3 vessels . Uterotonics: IV Pitocin, TXA, Methergine Placenta to be sent home with mother Uterine tone firm  Bleeding light after initial gush of blood after delivery of placenta   2nd degree  laceration identified.  Episiotomy:None  Local analgesia: 1% Lidocaine  Repair: 2nd degree well approximated  Est. Blood Loss ([id number]   Complications: Q000111Q, IV TXA and IM Methergine given  Mom to postpartum.  Baby "[name]" to Couplet care / Skin to Skin.  Delivery  Report:  Review the Delivery Report for details.    [name], SNM 04/18/2021, 10:30 AM

## 2021-04-13 ENCOUNTER — Ambulatory Visit (INDEPENDENT_AMBULATORY_CARE_PROVIDER_SITE_OTHER): Payer: BC Managed Care – PPO | Admitting: Women's Health

## 2021-04-13 ENCOUNTER — Other Ambulatory Visit: Payer: Self-pay

## 2021-04-13 ENCOUNTER — Encounter: Payer: Self-pay | Admitting: Women's Health

## 2021-04-13 VITALS — BP 133/78 | HR 94 | Wt 189.0 lb

## 2021-04-13 DIAGNOSIS — O24410 Gestational diabetes mellitus in pregnancy, diet controlled: Secondary | ICD-10-CM

## 2021-04-13 DIAGNOSIS — O0993 Supervision of high risk pregnancy, unspecified, third trimester: Secondary | ICD-10-CM

## 2021-04-13 NOTE — Progress Notes (Signed)
HIGH-RISK PREGNANCY VISIT Patient name: Donna Mcknight MRN [id number]  Date of birth: 15-Jul-1995 Chief Complaint:   Routine Prenatal Visit  History of Present Illness:   Donna Mcknight is a 26 y.o. G26P0000 female at [redacted]w[redacted]d with an Estimated Date of Delivery: 04/18/21 being seen today for ongoing management of a high-risk pregnancy complicated by diabetes mellitus A1DM.    Today she reports  all sugars wnl . Contractions: Irritability. Vag. Bleeding: None.  Movement: Present. denies leaking of fluid.   Depression screen [facility] 2/9 01/08/2021 10/14/2020 08/17/2020 02/10/2020  Decreased Interest 0 0 0 1  Down, Depressed, Hopeless 0 0 0 1  PHQ - 2 Score 0 0 0 2  Altered sleeping 0 1 0 0  Tired, decreased energy 2 1 1  0  Change in appetite 1 3 2 1   Feeling bad or failure about yourself  0 0 0 0  Trouble concentrating 0 0 0 0  Moving slowly or fidgety/restless 0 0 0 0  Suicidal thoughts 0 0 0 0  PHQ-9 Score 3 5 3 3      GAD 7 : Generalized Anxiety Score 01/08/2021 10/14/2020 08/17/2020 02/10/2020  Nervous, Anxious, on Edge 1 0 1 1  Control/stop worrying 0 0 0 0  Worry too much - different things 0 0 0 0  Trouble relaxing 0 0 0 0  Restless 0 0 0 0  Easily annoyed or irritable 2 0 1 1  Afraid - awful might happen 0 0 0 0  Total GAD 7 Score 3 0 2 2     Review of Systems:   Pertinent items are noted in HPI Denies abnormal vaginal discharge w/ itching/odor/irritation, headaches, visual changes, shortness of breath, chest pain, abdominal pain, severe nausea/vomiting, or problems with urination or bowel movements unless otherwise stated above. Pertinent History Reviewed:  Reviewed past medical,surgical, social, obstetrical and family history.  Reviewed problem list, medications and allergies. Physical Assessment:   Vitals:   04/13/21 1351  BP: 133/78  Pulse: 94  Weight: 189 lb (85.7 kg)  Body mass index is 30.97 kg/m.           Physical Examination:   General appearance:  alert, well appearing, and in no distress  Mental status: alert, oriented to person, place, and time  Skin: warm & dry   Extremities: Edema: None    Cardiovascular: normal heart rate noted  Respiratory: normal respiratory effort, no distress  Abdomen: gravid, soft, non-tender  Pelvic: Cervical exam performed  Dilation: 1.5 Effacement (%): 80 Station: -2, offered membrane sweep, doesn't want until Friday as her mom is coming in then  Fetal Status: Fetal Heart Rate (bpm): 158 Fundal Height: 38 cm Movement: Present Presentation: Vertex  Fetal Surveillance Testing today: doppler   Chaperone: Faith Rogue    No results found for this or any previous visit (from the past 24 hour(s)).  Assessment & Plan:  High-risk pregnancy: G1P0000 at [redacted]w[redacted]d with an Estimated Date of Delivery: 04/18/21   1) A1DM, stable  Meds: No orders of the defined types were placed in this encounter.  Labs/procedures today: SVE  Treatment Plan:  wants membrane sweep Friday 1/6, IOL 1/8 @ [facility],  IOL form faxed via Epic and orders placed   Reviewed: Term labor symptoms and general obstetric precautions including but not limited to vaginal bleeding, contractions, leaking of fluid and fetal movement were reviewed in detail with the patient.  All questions were answered. Does have home bp cuff. Office bp cuff given: not  applicable. Check bp weekly, let us know if consistently >140 and/or >90.  Follow-up: Return for Friday for LROB and membrane sweep.   Future Appointments  Date Time Provider Department Center  04/18/2021 12:00 AM [facility] SCHED ROOM [facility] None    No orders of the defined types were placed in this encounter.  Cheral Marker CNM, [facility] 04/13/2021 2:40 PM

## 2021-04-13 NOTE — Patient Instructions (Signed)
Heath, thank you for choosing our office today! We appreciate the opportunity to meet your healthcare needs. You may receive a short survey by mail, e-mail, or through [employer]. If you are happy with your care we would appreciate if you could take just a few minutes to complete the survey questions. We read all of your comments and take your feedback very seriously. Thank you again for choosing our office.  Center for [employer] Team at [facility]  [facility] at [facility] ([address] [place], [place] [address]) Entrance C, located off of [address] Free 24/7 valet parking   CLASSES: Go to [email or web address] to register for classes (childbirth, breastfeeding, waterbirth, infant CPR, daddy bootcamp, etc.)  Call the office [phone]) or go to [facility] if: You begin to have strong, frequent contractions Your water breaks.  Sometimes it is a big gush of fluid, sometimes it is just a trickle that keeps getting your panties wet or running down your legs You have vaginal bleeding.  It is normal to have a small amount of spotting if your cervix was checked.  You don't feel your baby moving like normal.  If you don't, get you something to eat and drink and lay down and focus on feeling your baby move.   If your baby is still not moving like normal, you should call the office or go to [facility].  Call the office [phone]) or go to [facility] hospital for these signs of pre-eclampsia: Severe headache that does not go away with Tylenol Visual changes- seeing spots, double, blurred vision Pain under your right breast or upper abdomen that does not go away with Tums or heartburn medicine Nausea and/or vomiting Severe swelling in your hands, feet, and face   [facility] Pediatricians/Family Doctors [facility]): [address] Colette Ribas, [phone]           [facility]: [address], [phone]                 [facility]): [address], [phone] (call to ask if accepting patients) [facility] Department: [address] [place], [phone]    [facility] Pediatricians/Family Doctors [facility]): [address], [phone] [facility]: [address] [place], [phone] [facility]: [address], [phone]  [facility] Doctors  [facility]): [phone] [facility]: [address] [phone]   [facility] Doctors [facility]: [address] [phone]  [facility] Doctors  [facility]: [phone], [phone]  Home Blood Pressure Monitoring for Patients   Your provider has recommended that you check your blood pressure (BP) at least once a week at home. If you do not have a blood pressure cuff at home, one will be provided for you. Contact your provider if you have not received your monitor within 1 week.   Helpful Tips for Accurate Home Blood Pressure Checks  Don't smoke, exercise, or drink caffeine 30 minutes before checking your BP Use the restroom before checking your BP (a full bladder can raise your pressure) Relax in a comfortable upright chair Feet on the ground Left arm resting comfortably on a flat surface at the level of your heart Legs uncrossed Back supported Sit quietly and don't talk Place the cuff on your bare arm Adjust snuggly, so that only two fingertips  can fit between your skin and the top of the cuff Check 2 readings separated by at least one minute Keep a log of your BP readings For a visual, please reference this diagram: http://ccnc.care/bpdiagram  Provider Name: [facility]     Phone: [phone]  Zone 1: ALL CLEAR  Continue to monitor your symptoms:  BP reading is less than 140 (top number) or less than 90 (bottom number)  No right  upper stomach pain No headaches or seeing spots No feeling nauseated or throwing up No swelling in face and hands  Zone 2: CAUTION Call your doctor's office for any of the following:  BP reading is greater than 140 (top number) or greater than 90 (bottom number)  Stomach pain under your ribs in the middle or right side Headaches or seeing spots Feeling nauseated or throwing up Swelling in face and hands  Zone 3: EMERGENCY  Seek immediate medical care if you have any of the following:  BP reading is greater than160 (top number) or greater than 110 (bottom number) Severe headaches not improving with Tylenol Serious difficulty catching your breath Any worsening symptoms from Zone 2   Braxton Hicks Contractions Contractions of the uterus can occur throughout pregnancy, but they are not always a sign that you are in labor. You may have practice contractions called Braxton Hicks contractions. These false labor contractions are sometimes confused with true labor. What are [place] contractions? Braxton Hicks contractions are tightening movements that occur in the muscles of the uterus before labor. Unlike true labor contractions, these contractions do not result in opening (dilation) and thinning of the cervix. Toward the end of pregnancy (32-34 weeks), Braxton Hicks contractions can happen more often and may become stronger. These contractions are sometimes difficult to tell apart from true labor because they can be very uncomfortable. You should not feel embarrassed if you go to the hospital with false labor. Sometimes, the only way to tell if you are in true labor is for your health care provider to look for changes in the cervix. The health care provider will do a physical exam and may monitor your contractions. If you are not in true labor, the exam should show that your cervix is not dilating and your water has not broken. If there are no other health problems associated with your  pregnancy, it is completely safe for you to be sent home with false labor. You may continue to have Braxton Hicks contractions until you go into true labor. How to tell the difference between true labor and false labor True labor Contractions last 30-70 seconds. Contractions become very regular. Discomfort is usually felt in the top of the uterus, and it spreads to the lower abdomen and low back. Contractions do not go away with walking. Contractions usually become more intense and increase in frequency. The cervix dilates and gets thinner. False labor Contractions are usually shorter and not as strong as true labor contractions. Contractions are usually irregular. Contractions are often felt in the front of the lower abdomen and in the groin. Contractions may go away when you walk around or change positions while lying down. Contractions get weaker and are shorter-lasting as time goes on. The cervix usually does not dilate or become thin. Follow these instructions at home:  Take over-the-counter and prescription medicines only as told by your health care provider. Keep up with your usual exercises and follow other instructions from your health care provider. Eat and drink lightly if you think  you are going into labor. If Braxton Hicks contractions are making you uncomfortable: Change your position from lying down or resting to walking, or change from walking to resting. Sit and rest in a tub of warm water. Drink enough fluid to keep your urine pale yellow. Dehydration may cause these contractions. Do slow and deep breathing several times an hour. Keep all follow-up prenatal visits as told by your health care provider. This is important. Contact a health care provider if: You have a fever. You have continuous pain in your abdomen. Get help right away if: Your contractions become stronger, more regular, and closer together. You have fluid leaking or gushing from your vagina. You pass  blood-tinged mucus (bloody show). You have bleeding from your vagina. You have low back pain that you never had before. You feel your babys head pushing down and causing pelvic pressure. Your baby is not moving inside you as much as it used to. Summary Contractions that occur before labor are called Braxton Hicks contractions, false labor, or practice contractions. Braxton Hicks contractions are usually shorter, weaker, farther apart, and less regular than true labor contractions. True labor contractions usually become progressively stronger and regular, and they become more frequent. Manage discomfort from [facility] contractions by changing position, resting in a warm bath, drinking plenty of water, or practicing deep breathing. This information is not intended to replace advice given to you by your health care provider. Make sure you discuss any questions you have with your health care provider. Document Revised: 03/10/2017 Document Reviewed: 08/11/2016 Elsevier Patient Education  [place].

## 2021-04-14 ENCOUNTER — Encounter (HOSPITAL_COMMUNITY): Payer: Self-pay | Admitting: *Deleted

## 2021-04-14 ENCOUNTER — Encounter (HOSPITAL_COMMUNITY): Payer: Self-pay | Admitting: Obstetrics & Gynecology

## 2021-04-14 ENCOUNTER — Encounter: Payer: Self-pay | Admitting: Women's Health

## 2021-04-14 ENCOUNTER — Telehealth (HOSPITAL_COMMUNITY): Payer: Self-pay | Admitting: *Deleted

## 2021-04-14 NOTE — Telephone Encounter (Signed)
Preadmission screen  

## 2021-04-15 ENCOUNTER — Other Ambulatory Visit: Payer: Self-pay | Admitting: Advanced Practice Midwife

## 2021-04-15 DIAGNOSIS — Z20822 Contact with and (suspected) exposure to covid-19: Secondary | ICD-10-CM | POA: Diagnosis not present

## 2021-04-16 ENCOUNTER — Other Ambulatory Visit: Payer: Self-pay

## 2021-04-16 ENCOUNTER — Ambulatory Visit (INDEPENDENT_AMBULATORY_CARE_PROVIDER_SITE_OTHER): Payer: BC Managed Care – PPO | Admitting: Advanced Practice Midwife

## 2021-04-16 ENCOUNTER — Encounter: Payer: Self-pay | Admitting: Women's Health

## 2021-04-16 VITALS — BP 122/76 | HR 94 | Wt 189.0 lb

## 2021-04-16 DIAGNOSIS — Z3A39 39 weeks gestation of pregnancy: Secondary | ICD-10-CM

## 2021-04-16 DIAGNOSIS — F32A Depression, unspecified: Secondary | ICD-10-CM

## 2021-04-16 DIAGNOSIS — O0990 Supervision of high risk pregnancy, unspecified, unspecified trimester: Secondary | ICD-10-CM

## 2021-04-16 DIAGNOSIS — F419 Anxiety disorder, unspecified: Secondary | ICD-10-CM

## 2021-04-16 NOTE — Progress Notes (Signed)
HIGH-RISK PREGNANCY VISIT Patient name: Donna Mcknight MRN [id number]  Date of birth: December 17, 1995 Chief Complaint:   Routine Prenatal Visit  History of Present Illness:   Donna Mcknight is a 26 y.o. G67P0000 female at [redacted]w[redacted]d with an Estimated Date of Delivery: 04/18/21 being seen today for ongoing management of a high-risk pregnancy complicated by diabetes mellitus A1DM.  Occ 2hrPP values in low 120s, but otherwise all normal.  Today she reports  doing well but ready!; requesting membrane sweep today and also outpatient foley prior to induction . Contractions: Not present. Vag. Bleeding: None.  Movement: Present. denies leaking of fluid.   Depression screen [facility] 2/9 01/08/2021 10/14/2020 08/17/2020 02/10/2020  Decreased Interest 0 0 0 1  Down, Depressed, Hopeless 0 0 0 1  PHQ - 2 Score 0 0 0 2  Altered sleeping 0 1 0 0  Tired, decreased energy 2 1 1  0  Change in appetite 1 3 2 1   Feeling bad or failure about yourself  0 0 0 0  Trouble concentrating 0 0 0 0  Moving slowly or fidgety/restless 0 0 0 0  Suicidal thoughts 0 0 0 0  PHQ-9 Score 3 5 3 3      GAD 7 : Generalized Anxiety Score 01/08/2021 10/14/2020 08/17/2020 02/10/2020  Nervous, Anxious, on Edge 1 0 1 1  Control/stop worrying 0 0 0 0  Worry too much - different things 0 0 0 0  Trouble relaxing 0 0 0 0  Restless 0 0 0 0  Easily annoyed or irritable 2 0 1 1  Afraid - awful might happen 0 0 0 0  Total GAD 7 Score 3 0 2 2     Review of Systems:   Pertinent items are noted in HPI Denies abnormal vaginal discharge w/ itching/odor/irritation, headaches, visual changes, shortness of breath, chest pain, abdominal pain, severe nausea/vomiting, or problems with urination or bowel movements unless otherwise stated above. Pertinent History Reviewed:  Reviewed past medical,surgical, social, obstetrical and family history.  Reviewed problem list, medications and allergies. Physical Assessment:   Vitals:   04/16/21 0831  BP:  122/76  Pulse: 94  Weight: 189 lb (85.7 kg)  Body mass index is 30.97 kg/m.           Physical Examination:   General appearance: alert, well appearing, and in no distress  Mental status: alert, oriented to person, place, and time  Skin: warm & dry   Extremities: Edema: None    Cardiovascular: normal heart rate noted  Respiratory: normal respiratory effort, no distress  Abdomen: gravid, soft, non-tender  Pelvic: Cervical exam performed  Dilation: 2 Effacement (%): Thick Station: -2  Fetal Status: Fetal Heart Rate (bpm): 152 Fundal Height: 37 cm Movement: Present Presentation: Vertex  Fetal Surveillance Testing today: doppler    No results found for this or any previous visit (from the past 24 hour(s)).  Assessment & Plan:  High-risk pregnancy: G1P0000 at [redacted]w[redacted]d with an Estimated Date of Delivery: 04/18/21   1) GDMA1, stable; EFW 47% @ 36wk; for IOL 04/18/21 at [facility]; to MAU tomorrow for foley placement   Meds: No orders of the defined types were placed in this encounter.   Labs/procedures today: SVE and membrane sweep  Treatment Plan:  IOL 04/18/21 at [facility] with [facility] foley placement 04/17/21  Reviewed: Term labor symptoms and general obstetric precautions including but not limited to vaginal bleeding, contractions, leaking of fluid and fetal movement were reviewed in detail with the patient.  All  questions were answered. Does have home bp cuff. Office bp cuff given: not applicable. Check bp daily, let us know if consistently >140 and/or >90.  Follow-up: Return in about 5 weeks (around 05/21/2021) for postpartum visit.   Future Appointments  Date Time Provider Department Center  04/17/2021 10:00 AM [facility]U 1 [facility] None  04/18/2021 12:00 AM [facility]-LD SCHED ROOM [facility] None  05/24/2021  1:30 PM Cresenzo-Dishmon, Scarlette Calico, CNM [facility]    No orders of the defined types were placed in this encounter.  Arabella Merles CNM 04/16/2021 9:32 AM

## 2021-04-17 ENCOUNTER — Encounter (HOSPITAL_COMMUNITY): Payer: Self-pay | Admitting: Obstetrics and Gynecology

## 2021-04-17 ENCOUNTER — Other Ambulatory Visit: Payer: Self-pay

## 2021-04-17 ENCOUNTER — Inpatient Hospital Stay (HOSPITAL_COMMUNITY)
Admission: RE | Admit: 2021-04-17 | Discharge: 2021-04-17 | Disposition: A | Discharge status: Still In Hospital | Payer: BC Managed Care – PPO | Source: Ambulatory Visit | Attending: Obstetrics and Gynecology | Admitting: Obstetrics and Gynecology

## 2021-04-17 DIAGNOSIS — Z3490 Encounter for supervision of normal pregnancy, unspecified, unspecified trimester: Secondary | ICD-10-CM | POA: Diagnosis not present

## 2021-04-17 DIAGNOSIS — Z3A40 40 weeks gestation of pregnancy: Secondary | ICD-10-CM | POA: Diagnosis not present

## 2021-04-17 DIAGNOSIS — O9081 Anemia of the puerperium: Secondary | ICD-10-CM | POA: Diagnosis not present

## 2021-04-17 DIAGNOSIS — D62 Acute posthemorrhagic anemia: Secondary | ICD-10-CM | POA: Diagnosis not present

## 2021-04-17 DIAGNOSIS — Z7982 Long term (current) use of aspirin: Secondary | ICD-10-CM | POA: Diagnosis not present

## 2021-04-17 DIAGNOSIS — Z3A39 39 weeks gestation of pregnancy: Secondary | ICD-10-CM | POA: Diagnosis not present

## 2021-04-17 DIAGNOSIS — Z20822 Contact with and (suspected) exposure to covid-19: Secondary | ICD-10-CM | POA: Diagnosis not present

## 2021-04-17 DIAGNOSIS — O24420 Gestational diabetes mellitus in childbirth, diet controlled: Secondary | ICD-10-CM | POA: Diagnosis not present

## 2021-04-17 NOTE — MAU Provider Note (Signed)
HPI Donna Mcknight is a 26 y.o. female  G1P0000 @39 .6 wks here for outpatient foley placement for cervical ripening. Pt reports no contractions. She denies VB or LOF. She reports normal fetal movement. All other systems negative.    Patient's last menstrual period was 07/12/2020 (exact date).  OB History  Gravida Para Term Preterm AB Living  1 0 0 0 0 0  SAB IAB Ectopic Multiple Live Births  0 0 0 0 0    # Outcome Date GA Lbr Len/2nd Weight Sex Delivery Anes PTL Lv  1 Current             Past Medical History:  Diagnosis Date   Anxiety    Depression    Gestational diabetes    Mental disorder    anxiety, depression    Family History  Problem Relation Age of Onset   Hypertension Mother    Fibroids Mother    Cirrhosis Father    Cancer Maternal Grandmother    Diabetes Maternal Grandfather     Social History   Socioeconomic History   Marital status: Married    Spouse name: Not on file   Number of children: 0   Years of education: Not on file   Highest education level: Not on file  Occupational History   Not on file  Tobacco Use   Smoking status: Never   Smokeless tobacco: Never  Vaping Use   Vaping Use: Former  Substance and Sexual Activity   Alcohol use: Yes    Alcohol/week: 7.0 standard drinks    Types: 7 Glasses of wine per week    Comment: daily   Drug use: No   Sexual activity: Yes    Birth control/protection: None  Other Topics Concern   Not on file  Social History Narrative   Not on file   Social Determinants of Health   Financial Resource Strain: Low Risk    Difficulty of Paying Living Expenses: Not very hard  Food Insecurity: No Food Insecurity   Worried About Charity fundraiser in the Last Year: Never true   Ran Out of Food in the Last Year: Never true  Transportation Needs: No Transportation Needs   Lack of Transportation (Medical): No   Lack of Transportation (Non-Medical): No  Physical Activity: Insufficiently Active   Days of  Exercise per Week: 2 days   Minutes of Exercise per Session: 20 min  Stress: No Stress Concern Present   Feeling of Stress : Not at all  Social Connections: Moderately Isolated   Frequency of Communication with Friends and Family: Twice a week   Frequency of Social Gatherings with Friends and Family: Once a week   Attends Religious Services: Never   Marine scientist or Organizations: No   Attends Archivist Meetings: Never   Marital Status: Married    Allergies  Allergen Reactions   Other     Nickel      No current facility-administered medications on file prior to encounter.   Current Outpatient Medications on File Prior to Encounter  Medication Sig Dispense Refill   aspirin 81 MG EC tablet Take 1 tablet (81 mg total) by mouth daily. Swallow whole. 90 tablet 3   Blood Glucose Monitoring Suppl (ONETOUCH VERIO) w/Device KIT Use as directed to check blood sugar 4 times daily 1 kit 0   glucose blood test strip Use as instructed to check blood sugar 4 times daily 100 each PRN   Lancets (ONETOUCH ULTRASOFT)  lancets Use as directed to check blood sugar 4 times daily 100 each PRN   omeprazole (PRILOSEC) 20 MG capsule Take 1 capsule (20 mg total) by mouth daily. 1 tablet a day 30 capsule 6   polyethylene glycol powder (GLYCOLAX/MIRALAX) 17 GM/SCOOP powder 1 scoop daily or as needed 255 g 11   Prenatal Vit-Fe Fumarate-FA (PRENATAL VITAMIN PO) Take by mouth.      Review of Systems  Constitutional: Negative.  Negative for fatigue and fever.  HENT: Negative.    Respiratory: Negative.  Negative for shortness of breath.   Cardiovascular: Negative.  Negative for chest pain.  Gastrointestinal: Negative.  Negative for abdominal pain, constipation, diarrhea, nausea and vomiting.  Genitourinary: Negative.  Negative for dysuria, vaginal bleeding and vaginal discharge.  Neurological: Negative.  Negative for dizziness and headaches.   Physical Exam   Vitals:   04/17/21 0930   BP: 121/87  Pulse: 94  Resp: 16  Temp: 98 F (36.7 C)  TempSrc: Oral  SpO2: 97%    Physical Exam Vitals and nursing note reviewed.  Constitutional:      General: She is not in acute distress.    Appearance: She is well-developed.  HENT:     Head: Normocephalic.  Eyes:     Pupils: Pupils are equal, round, and reactive to light.  Cardiovascular:     Rate and Rhythm: Normal rate and regular rhythm.     Heart sounds: Normal heart sounds.  Pulmonary:     Effort: Pulmonary effort is normal. No respiratory distress.     Breath sounds: Normal breath sounds.  Abdominal:     General: Bowel sounds are normal. There is no distension.     Palpations: Abdomen is soft.     Tenderness: There is no abdominal tenderness.  Skin:    General: Skin is warm and dry.  Neurological:     Mental Status: She is alert and oriented to person, place, and time.  Psychiatric:        Mood and Affect: Mood normal.        Behavior: Behavior normal.        Thought Content: Thought content normal.        Judgment: Judgment normal.     Cervix: 2/60/-3  MAU Course   Procedure: Patient informed of R/B/A of procedure. NST was performed and was reactive prior to procedure. Procedure done to begin ripening of the cervix prior to admission for induction of labor. Appropriate time out taken. The patient was placed in the lithotomy position and a cervical exam was performed and a finger was used to guide the foley balloon through the internal os of the cervix. Foley Balloon filled with 60cc of sterile water. Plug inserted into end of the foley. Foley placed on tension and taped to medical thigh. NST:  Baseline: 150 bpm, Variability: Good {> 6 bpm), Accelerations: Reactive, and Decelerations: Absent there were no signs of tachysystole or hypertonus. All equipment was removed and accounted for. The patient tolerated the procedure well.   Assessment and Plan:   1. Supervision of high risk pregnancy, antepartum    2. Anxiety and depression     S/p Outpatient placement of foley balloon catheter for cervical ripening. Induction of labor scheduled for tomorrow at 1200 am. Reassuring FHR tracing with no concerns at present. Warning signs given to patient to include return to MAU for heavy vaginal bleeding, rupture of membranes, painful uterine contractions q 5 mins or less, severe abdominal discomfort, decreased fetal  movement.   Wende Mott, CNM 04/17/2021 10:08 AM

## 2021-04-17 NOTE — Progress Notes (Deleted)
  The note originally documented on this encounter has been moved the the encounter in which it belongs.  

## 2021-04-17 NOTE — MAU Provider Note (Deleted)
  The note originally documented on this encounter has been moved the the encounter in which it belongs.  

## 2021-04-17 NOTE — Discharge Instructions (Signed)

## 2021-04-17 NOTE — MAU Note (Signed)
Donna Mcknight is a 26 y.o. at [redacted]w[redacted]d here in MAU reporting: scheduled for IOL tonight at midnight, here for foley bulb placement. + FM. Denies LOF. States some brown discharge, had membrane sweep yesterday. Ongoing cramping but no pain at this time.  Pain score: 0/10  Vitals:   04/17/21 0930  BP: 121/87  Pulse: 94  Resp: 16  Temp: 98 F (36.7 C)  SpO2: 97%     FHT:+FM, EFM applied in room  Lab orders placed from triage: none

## 2021-04-17 NOTE — MAU Note (Deleted)
  The note originally documented on this encounter has been moved the the encounter in which it belongs.  

## 2021-04-17 NOTE — Progress Notes (Signed)
CNM at bedside to review discharge instructions. All questions answered.   Discharge vitals:  BP: 122/81 Pulse: 70 RR: 16 O2: 100%  Wende Mott, [place] 04/17/21 10:39 AM

## 2021-04-18 ENCOUNTER — Inpatient Hospital Stay (HOSPITAL_COMMUNITY)
Admission: AD | Admit: 2021-04-18 | Discharge: 2021-04-19 | DRG: 806 | Disposition: A | Discharge status: Home / Self Care | Payer: BC Managed Care – PPO | Attending: Obstetrics and Gynecology | Admitting: Obstetrics and Gynecology

## 2021-04-18 ENCOUNTER — Inpatient Hospital Stay (HOSPITAL_COMMUNITY): Payer: BC Managed Care – PPO

## 2021-04-18 ENCOUNTER — Encounter (HOSPITAL_COMMUNITY): Payer: Self-pay | Admitting: Obstetrics & Gynecology

## 2021-04-18 DIAGNOSIS — Z3490 Encounter for supervision of normal pregnancy, unspecified, unspecified trimester: Secondary | ICD-10-CM

## 2021-04-18 DIAGNOSIS — O0990 Supervision of high risk pregnancy, unspecified, unspecified trimester: Secondary | ICD-10-CM

## 2021-04-18 DIAGNOSIS — Z3A40 40 weeks gestation of pregnancy: Secondary | ICD-10-CM

## 2021-04-18 DIAGNOSIS — O24410 Gestational diabetes mellitus in pregnancy, diet controlled: Secondary | ICD-10-CM | POA: Diagnosis present

## 2021-04-18 DIAGNOSIS — Z20822 Contact with and (suspected) exposure to covid-19: Secondary | ICD-10-CM | POA: Diagnosis present

## 2021-04-18 DIAGNOSIS — O24419 Gestational diabetes mellitus in pregnancy, unspecified control: Secondary | ICD-10-CM

## 2021-04-18 DIAGNOSIS — O9081 Anemia of the puerperium: Secondary | ICD-10-CM | POA: Diagnosis not present

## 2021-04-18 DIAGNOSIS — Z8759 Personal history of other complications of pregnancy, childbirth and the puerperium: Secondary | ICD-10-CM | POA: Diagnosis not present

## 2021-04-18 DIAGNOSIS — O24420 Gestational diabetes mellitus in childbirth, diet controlled: Principal | ICD-10-CM | POA: Diagnosis present

## 2021-04-18 DIAGNOSIS — D62 Acute posthemorrhagic anemia: Secondary | ICD-10-CM | POA: Diagnosis not present

## 2021-04-18 DIAGNOSIS — Z7982 Long term (current) use of aspirin: Secondary | ICD-10-CM | POA: Diagnosis not present

## 2021-04-18 DIAGNOSIS — F418 Other specified anxiety disorders: Secondary | ICD-10-CM | POA: Diagnosis present

## 2021-04-18 DIAGNOSIS — Z3A39 39 weeks gestation of pregnancy: Secondary | ICD-10-CM

## 2021-04-18 DIAGNOSIS — F32A Depression, unspecified: Secondary | ICD-10-CM | POA: Diagnosis present

## 2021-04-18 HISTORY — DX: Anxiety disorder, unspecified: F41.9

## 2021-04-18 HISTORY — DX: Depression, unspecified: F32.A

## 2021-04-18 LAB — CBC
HCT: 30.8 % — ABNORMAL LOW (ref 36.0–46.0)
HCT: 29.3 % — ABNORMAL LOW (ref 36.0–46.0)
Hemoglobin: 9.7 g/dL — ABNORMAL LOW (ref 12.0–15.0)
Hemoglobin: 9.3 g/dL — ABNORMAL LOW (ref 12.0–15.0)
MCH: 25.8 pg — ABNORMAL LOW (ref 26.0–34.0)
MCH: 25.8 pg — ABNORMAL LOW (ref 26.0–34.0)
MCHC: 31.5 g/dL (ref 30.0–36.0)
MCHC: 31.7 g/dL (ref 30.0–36.0)
MCV: 81.9 fL (ref 80.0–100.0)
MCV: 81.4 fL (ref 80.0–100.0)
Platelets: 310 10*3/uL (ref 150–400)
Platelets: 302 10*3/uL (ref 150–400)
RBC: 3.76 MIL/uL — ABNORMAL LOW (ref 3.87–5.11)
RBC: 3.6 MIL/uL — ABNORMAL LOW (ref 3.87–5.11)
RDW: 15 % (ref 11.5–15.5)
RDW: 14.9 % (ref 11.5–15.5)
WBC: 12.4 10*3/uL — ABNORMAL HIGH (ref 4.0–10.5)
WBC: 21 10*3/uL — ABNORMAL HIGH (ref 4.0–10.5)
nRBC: 0 % (ref 0.0–0.2)
nRBC: 0 % (ref 0.0–0.2)

## 2021-04-18 LAB — RESP PANEL BY RT-PCR (FLU A&B, COVID) ARPGX2
Influenza A by PCR: NEGATIVE
Influenza B by PCR: NEGATIVE
SARS Coronavirus 2 by RT PCR: NEGATIVE

## 2021-04-18 LAB — GLUCOSE, CAPILLARY
Glucose-Capillary: 92 mg/dL (ref 70–99)
Glucose-Capillary: 108 mg/dL — ABNORMAL HIGH (ref 70–99)

## 2021-04-18 LAB — TYPE AND SCREEN
ABO/RH(D): O POS
Antibody Screen: NEGATIVE

## 2021-04-18 LAB — RPR: RPR Ser Ql: NONREACTIVE

## 2021-04-18 MED ORDER — OXYCODONE-ACETAMINOPHEN 5-325 MG PO TABS
2.0000 | ORAL_TABLET | ORAL | Status: DC | PRN
  Administered 2021-04-19: 2 via ORAL
  Filled 2021-04-18: qty 2

## 2021-04-18 MED ORDER — BENZOCAINE-MENTHOL 20-0.5 % EX AERO
1.0000 "application " | INHALATION_SPRAY | CUTANEOUS | Status: DC | PRN
  Administered 2021-04-18: 1 via TOPICAL
  Filled 2021-04-18: qty 56

## 2021-04-18 MED ORDER — OXYCODONE-ACETAMINOPHEN 5-325 MG PO TABS: 1.0000 | ORAL_TABLET | ORAL | Status: DC | PRN

## 2021-04-18 MED ORDER — TETANUS-DIPHTH-ACELL PERTUSSIS 5-2.5-18.5 LF-MCG/0.5 IM SUSY: 0.5000 mL | PREFILLED_SYRINGE | Freq: Once | INTRAMUSCULAR | Status: DC

## 2021-04-18 MED ORDER — ONDANSETRON HCL 4 MG PO TABS: 4.0000 mg | ORAL_TABLET | ORAL | Status: DC | PRN

## 2021-04-18 MED ORDER — LIDOCAINE HCL (PF) 1 % IJ SOLN
30.0000 mL | INTRAMUSCULAR | Status: AC | PRN
  Administered 2021-04-18: 30 mL via SUBCUTANEOUS
  Filled 2021-04-18: qty 30

## 2021-04-18 MED ORDER — TRANEXAMIC ACID-NACL 1000-0.7 MG/100ML-% IV SOLN: 1000.0000 mg | INTRAVENOUS | Status: DC

## 2021-04-18 MED ORDER — METHYLERGONOVINE MALEATE 0.2 MG/ML IJ SOLN
0.2000 mg | Freq: Once | INTRAMUSCULAR | Status: AC
  Administered 2021-04-18: 0.2 mg via INTRAMUSCULAR

## 2021-04-18 MED ORDER — LACTATED RINGERS IV BOLUS
1000.0000 mL | Freq: Once | INTRAVENOUS | Status: AC
  Administered 2021-04-18: 1000 mL via INTRAVENOUS

## 2021-04-18 MED ORDER — TRANEXAMIC ACID-NACL 1000-0.7 MG/100ML-% IV SOLN
INTRAVENOUS | Status: AC
  Administered 2021-04-18: 1000 mg
  Filled 2021-04-18: qty 100

## 2021-04-18 MED ORDER — SIMETHICONE 80 MG PO CHEW: 80.0000 mg | CHEWABLE_TABLET | ORAL | Status: DC | PRN

## 2021-04-18 MED ORDER — ONDANSETRON HCL 4 MG/2ML IJ SOLN
4.0000 mg | Freq: Four times a day (QID) | INTRAMUSCULAR | Status: DC | PRN
  Administered 2021-04-18: 4 mg via INTRAVENOUS
  Filled 2021-04-18: qty 2

## 2021-04-18 MED ORDER — MISOPROSTOL 50MCG HALF TABLET
50.0000 ug | ORAL_TABLET | ORAL | Status: DC | PRN
  Administered 2021-04-18: 50 ug via BUCCAL
  Filled 2021-04-18: qty 1

## 2021-04-18 MED ORDER — PRENATAL MULTIVITAMIN CH
1.0000 | ORAL_TABLET | Freq: Every day | ORAL | Status: DC
  Administered 2021-04-18 – 2021-04-19 (×2): 1 via ORAL
  Filled 2021-04-18 (×2): qty 1

## 2021-04-18 MED ORDER — MAGNESIUM HYDROXIDE 400 MG/5ML PO SUSP: 30.0000 mL | ORAL | Status: DC | PRN

## 2021-04-18 MED ORDER — DIPHENHYDRAMINE HCL 25 MG PO CAPS: 25.0000 mg | ORAL_CAPSULE | Freq: Four times a day (QID) | ORAL | Status: DC | PRN

## 2021-04-18 MED ORDER — LACTATED RINGERS IV SOLN: INTRAVENOUS | Status: DC

## 2021-04-18 MED ORDER — METHYLERGONOVINE MALEATE 0.2 MG/ML IJ SOLN
INTRAMUSCULAR | Status: AC
  Filled 2021-04-18: qty 1

## 2021-04-18 MED ORDER — TERBUTALINE SULFATE 1 MG/ML IJ SOLN: 0.2500 mg | Freq: Once | INTRAMUSCULAR | Status: DC | PRN

## 2021-04-18 MED ORDER — DIBUCAINE (PERIANAL) 1 % EX OINT: 1.0000 "application " | TOPICAL_OINTMENT | CUTANEOUS | Status: DC | PRN

## 2021-04-18 MED ORDER — COCONUT OIL OIL: 1.0000 "application " | TOPICAL_OIL | Status: DC | PRN

## 2021-04-18 MED ORDER — FENTANYL CITRATE (PF) 100 MCG/2ML IJ SOLN: 100.0000 ug | INTRAMUSCULAR | Status: DC | PRN

## 2021-04-18 MED ORDER — FLEET ENEMA 7-19 GM/118ML RE ENEM: 1.0000 | ENEMA | RECTAL | Status: DC | PRN

## 2021-04-18 MED ORDER — LACTATED RINGERS IV SOLN: 500.0000 mL | INTRAVENOUS | Status: DC | PRN

## 2021-04-18 MED ORDER — OXYCODONE-ACETAMINOPHEN 5-325 MG PO TABS: 2.0000 | ORAL_TABLET | ORAL | Status: DC | PRN

## 2021-04-18 MED ORDER — ACETAMINOPHEN 325 MG PO TABS
650.0000 mg | ORAL_TABLET | ORAL | Status: DC | PRN
  Administered 2021-04-18 – 2021-04-19 (×2): 650 mg via ORAL
  Filled 2021-04-18 (×2): qty 2

## 2021-04-18 MED ORDER — HYDROXYZINE HCL 50 MG PO TABS: 50.0000 mg | ORAL_TABLET | Freq: Four times a day (QID) | ORAL | Status: DC | PRN

## 2021-04-18 MED ORDER — IBUPROFEN 600 MG PO TABS
600.0000 mg | ORAL_TABLET | Freq: Four times a day (QID) | ORAL | Status: DC
  Administered 2021-04-18 – 2021-04-19 (×5): 600 mg via ORAL
  Filled 2021-04-18 (×6): qty 1

## 2021-04-18 MED ORDER — ONDANSETRON HCL 4 MG/2ML IJ SOLN: 4.0000 mg | INTRAMUSCULAR | Status: DC | PRN

## 2021-04-18 MED ORDER — ACETAMINOPHEN 325 MG PO TABS: 650.0000 mg | ORAL_TABLET | ORAL | Status: DC | PRN

## 2021-04-18 MED ORDER — MEASLES, MUMPS & RUBELLA VAC IJ SOLR: 0.5000 mL | Freq: Once | INTRAMUSCULAR | Status: DC

## 2021-04-18 MED ORDER — OXYCODONE-ACETAMINOPHEN 5-325 MG PO TABS
1.0000 | ORAL_TABLET | ORAL | Status: DC | PRN
  Administered 2021-04-18: 1 via ORAL
  Filled 2021-04-18: qty 1

## 2021-04-18 MED ORDER — OXYTOCIN-SODIUM CHLORIDE 30-0.9 UT/500ML-% IV SOLN
2.5000 [IU]/h | INTRAVENOUS | Status: DC
  Administered 2021-04-18: 2.5 [IU]/h via INTRAVENOUS
  Filled 2021-04-18: qty 500

## 2021-04-18 MED ORDER — WITCH HAZEL-GLYCERIN EX PADS: 1.0000 "application " | MEDICATED_PAD | CUTANEOUS | Status: DC | PRN

## 2021-04-18 MED ORDER — OXYTOCIN BOLUS FROM INFUSION
333.0000 mL | Freq: Once | INTRAVENOUS | Status: AC
  Administered 2021-04-18: 333 mL via INTRAVENOUS

## 2021-04-18 MED ORDER — SOD CITRATE-CITRIC ACID 500-334 MG/5ML PO SOLN: 30.0000 mL | ORAL | Status: DC | PRN

## 2021-04-18 NOTE — Lactation Note (Signed)
This note was copied from a baby's chart. Lactation Consultation Note  Patient Name: Donna Mcknight [id number] Date: 04/18/2021 Reason for consult: L&D Initial assessment;Term;[place] Age:27 hours  During my visit, "Donna Mcknight" briefly showed feeding cues and then fell asleep. I gave Mom the option of trying to get her to latch again or allow her to continue sleeping on Mom's chest. Mom chose to let Donna Mcknight continue to rest for the time being. Brief education was done on feeding frequency for the 1st 24 hours of life along with earlier feeding cues.   Donna Must, RN shared with me that infant had done the breast crawl during the repair and had latched briefly.   Donna Mcknight Donna Mcknight 04/18/2021, 11:14 AM

## 2021-04-18 NOTE — Progress Notes (Signed)
Donna Mcknight is a 26 y.o. G1P0000 at 109w0d  admitted for induction of labor due to A1GDM.  Subjective: Reports getting more uncomfortable. Would like cervix checked  Objective: BP 123/79    Pulse 70    Temp 97.9 F (36.6 C) (Axillary)    Resp 16    Ht 5' 5.5" (1.664 m)    Wt 86.4 kg    LMP 07/12/2020 (Exact Date)    SpO2 100%    BMI 31.20 kg/m  No intake/output data recorded. No intake/output data recorded.  FHT:  FHR: 120 bpm, variability: moderate,  accelerations:  Present,  decelerations:  Absent UC:   regular, every 1-3 minutes SVE:   Dilation: 10 Effacement (%): 100 Station: Plus 2 Exam by:: Para March, CNM  Labs: Lab Results  Component Value Date   WBC 12.4 (H) 04/18/2021   HGB 9.7 (L) 04/18/2021   HCT 30.8 (L) 04/18/2021   MCV 81.9 04/18/2021   PLT 310 04/18/2021    Assessment / Plan: G1P0000 at [redacted]w[redacted]d  admitted for induction of labor due to A1GDM.  Labor:  s/p Cytotec, cervix changing and contracting on own. Will continue expectant management Fetal Wellbeing:  Category I Pain Control:  Nitrous Oxide I/D:   GBS neg  Warner Mccreedy 04/18/2021, 9:41 AM

## 2021-04-18 NOTE — H&P (Signed)
OBSTETRIC ADMISSION HISTORY AND PHYSICAL  Novaleigh Kohlman Durand-Johnson is a 26 y.o. female G1P0000 with IUP at 70w0dby LMP presenting for IOL for A1GDM. She reports +FMs, No LOF, no VB, no blurry vision, headaches or peripheral edema, and RUQ pain.  She plans on breast feeding feeding. She request phexxi for birth control. She received her prenatal care at [facility]  Dating: By LMP --->  Estimated Date of Delivery: 04/18/21  Sono:   _0 , CWD, normal anatomy, cephalic presentation, anterior placental lie, 2819g, 47% EFW   Prenatal History/Complications:  [id number]  Past Medical History: Past Medical History:  Diagnosis Date   Anxiety    Depression    Gestational diabetes    Mental disorder    anxiety, depression    Past Surgical History: Past Surgical History:  Procedure Laterality Date   MOUTH SURGERY      Obstetrical History: OB History     Gravida  1   Para  0   Term  0   Preterm  0   AB  0   Living  0      SAB  0   IAB  0   Ectopic  0   Multiple  0   Live Births  0           Social History Social History   Socioeconomic History   Marital status: Married    Spouse name: Not on file   Number of children: 0   Years of education: Not on file   Highest education level: Not on file  Occupational History   Not on file  Tobacco Use   Smoking status: Never   Smokeless tobacco: Never  Vaping Use   Vaping Use: Former  Substance and Sexual Activity   Alcohol use: Not Currently    Alcohol/week: 7.0 standard drinks    Types: 7 Glasses of wine per week    Comment: daily   Drug use: No   Sexual activity: Yes    Birth control/protection: Spermicide  Other Topics Concern   Not on file  Social History Narrative   Not on file   Social Determinants of Health   Financial Resource Strain: Low Risk    Difficulty of Paying Living Expenses: Not very hard  Food Insecurity: No Food Insecurity   Worried About RCharity fundraiserin the Last Year:  Never true   Ran Out of Food in the Last Year: Never true  Transportation Needs: No Transportation Needs   Lack of Transportation (Medical): No   Lack of Transportation (Non-Medical): No  Physical Activity: Insufficiently Active   Days of Exercise per Week: 2 days   Minutes of Exercise per Session: 20 min  Stress: No Stress Concern Present   Feeling of Stress : Not at all  Social Connections: Moderately Isolated   Frequency of Communication with Friends and Family: Twice a week   Frequency of Social Gatherings with Friends and Family: Once a week   Attends Religious Services: Never   AMarine scientistor Organizations: No   Attends CMusic therapist Never   Marital Status: Married    Family History: Family History  Problem Relation Age of Onset   Hypertension Mother    Fibroids Mother    Cirrhosis Father    Cancer Maternal Grandmother    Diabetes Maternal Grandfather     Allergies: Allergies  Allergen Reactions   Other     Nickel  Medications Prior to Admission  Medication Sig Dispense Refill Last Dose   aspirin 81 MG EC tablet Take 1 tablet (81 mg total) by mouth daily. Swallow whole. 90 tablet 3 04/17/2021   Blood Glucose Monitoring Suppl (ONETOUCH VERIO) w/Device KIT Use as directed to check blood sugar 4 times daily 1 kit 0 04/17/2021   glucose blood test strip Use as instructed to check blood sugar 4 times daily 100 each PRN 04/17/2021   Lancets (ONETOUCH ULTRASOFT) lancets Use as directed to check blood sugar 4 times daily 100 each PRN 04/17/2021   omeprazole (PRILOSEC) 20 MG capsule Take 1 capsule (20 mg total) by mouth daily. 1 tablet a day 30 capsule 6 04/17/2021   polyethylene glycol powder (GLYCOLAX/MIRALAX) 17 GM/SCOOP powder 1 scoop daily or as needed 255 g 11 04/17/2021   Prenatal Vit-Fe Fumarate-FA (PRENATAL VITAMIN PO) Take by mouth.   04/17/2021     Review of Systems   All systems reviewed and negative except as stated in HPI  Blood  pressure 117/73, pulse 96, temperature 98.2 F (36.8 C), temperature source Oral, resp. rate 16, height 5' 5.5" (1.664 m), weight 86.4 kg, last menstrual period 07/12/2020, SpO2 98 %. General appearance: alert and no distress Lungs: clear to auscultation bilaterally Heart: regular rate and rhythm Abdomen: soft, non-tender; bowel sounds normal Extremities: Homans sign is negative, no sign of DVT Presentation: cephalic Fetal monitoringBaseline: 130 bpm, Variability: Good {> 6 bpm), Accelerations: Reactive, and Decelerations: Absent Uterine activity every 6-7 min     Prenatal labs: ABO, Rh: O/Positive/-- (07/06 1116) Antibody: Negative (09/30 0814) Rubella: 1.78 (07/06 1116) RPR: Non Reactive (09/30 0814)  HBsAg: Negative (07/06 1116)  HIV: Non Reactive (09/30 0814)  GBS: Negative/-- (12/12 1330)  2 hr Glucola abnormal Genetic screening  normal MaterniT Anatomy US normal  Prenatal Transfer Tool  Maternal Diabetes: Yes:  Diabetes Type:  Diet controlled Genetic Screening: Normal Maternal Ultrasounds/Referrals: Normal Fetal Ultrasounds or other Referrals:  None Maternal Substance Abuse:  No Significant Maternal Medications:  None Significant Maternal Lab Results: Group B Strep negative  No results found for this or any previous visit (from the past 24 hour(s)).  Patient Active Problem List   Diagnosis Date Noted   Gestational diabetes mellitus, class A1 01/12/2021   Anxiety and depression 10/14/2020   Supervision of high risk pregnancy, antepartum 10/07/2020   Screening for cervical cancer 02/19/2020    Assessment/Plan:  DAYLE SHERPA is a 26 y.o. G1P0000 at 23w0dhere for IOL for A1GDM  #Labor:  patient had outpatient foley placed in MAU yesterday (1/7) morning that stayed in for ~1.5 hour and then came out. Cervix 3.5 cm and ~50% effaced. Having contractions every 6-7. Discussed in detail regarding induction methods and patient initially expressed hesitation  with cytotec as it is not FDA approved for inductions. She also is concerned about being on pitocin due to increased intensity of contractions.   Upon discussing cervical exam and need for further ripening and pros and cons of induction methods in detail, patient opted to start with cytotec.  #Pain: Ok with nitrous, plans to avoid epidural and IV  #FWB: Cat I #ID:  GBS neg #MOF: breast #MOC: phexxi #Circ:  N/A  #A1GDM Admission CBG 92 . Will do one more CBG. If normal then will hold off on q4hr checks.  ARenard Matter MD  04/18/2021, 12:47 AM

## 2021-04-18 NOTE — Discharge Summary (Signed)
Postpartum Discharge Summary  Date of Service updated-yes     Patient Name: Donna Mcknight DOB: 1996-03-30 MRN: [id number]  Date of admission: 04/18/2021 Delivery date:04/18/2021  Delivering provider: Dorathy Kinsman  Date of discharge: 04/19/2021  Admitting diagnosis: Gestational diabetes mellitus, class A1 [O24.410] Intrauterine pregnancy: [redacted]w[redacted]d     Secondary diagnosis:  Principal Problem:   Gestational diabetes mellitus, class A1 Active Problems:   Supervision of high risk pregnancy, antepartum   Anxiety and depression   Postpartum hemorrhage   Vaginal delivery  Additional problems: none    Discharge diagnosis: Term Pregnancy Delivered, GDM A1, Anemia, and PPH                                              Post partum procedures: Venofer Augmentation: Cytotec and OP Foley Complications: Hemorrhage>1020mL  Hospital course: Induction of Labor With Vaginal Delivery   26 y.o. yo G1P1001 at [redacted]w[redacted]d was admitted to the hospital 04/18/2021 for induction of labor.  Indication for induction: A1 DM.  Patient had an uncomplicated labor course as follows: Membrane Rupture Time/Date: 4:51 AM ,04/18/2021   Delivery Method:Vaginal, Spontaneous  Episiotomy: None  Lacerations:  2nd degree  Pt experienced an immediate postpartum Hemorrhage that responded immediately to interventions. Details of delivery can be found in separate delivery note. Hgb dropped to 7.3 in am. Tx'd with Venofer Patient had a normal postpartum course. Patient is discharged home 04/19/21.  Newborn Data: Birth date:04/18/2021  Birth time:9:45 AM  Gender:Female  Living status:Living  Apgars:9 ,9  Weight:3459 g   Magnesium Sulfate received: No BMZ received: No Rhophylac:N/A MMR:N/A T-DaP:Given prenatally Flu: No Transfusion:No  Physical exam  Vitals:   04/18/21 1743 04/18/21 2307 04/19/21 0209 04/19/21 0641  BP: 114/71 104/65 102/66 110/70  Pulse: 72 78 94 77  Resp: 17 16 16 16   Temp: 99.2 F (37.3 C)  98.7 F (37.1 C) 97.7 F (36.5 C) 98 F (36.7 C)  TempSrc: Oral Oral Axillary Oral  SpO2: 99% 98% 99% 99%  Weight:      Height:       General: alert, cooperative, and no distress GU: lac intact and well approximated, no edema or erythema Lochia: appropriate Uterine Fundus: firm Incision: N/A DVT Evaluation: No evidence of DVT seen on physical exam. Negative Homan's sign. No cords or calf tenderness. No significant calf/ankle edema. Labs: Lab Results  Component Value Date   WBC 14.8 (H) 04/19/2021   HGB 7.3 (L) 04/19/2021   HCT 23.0 (L) 04/19/2021   MCV 83.0 04/19/2021   PLT 259 04/19/2021   No flowsheet data found. Edinburgh Score: Edinburgh Postnatal Depression Scale Screening Tool 04/18/2021  I have been able to laugh and see the funny side of things. 0  I have looked forward with enjoyment to things. 0  I have blamed myself unnecessarily when things went wrong. 2  I have been anxious or worried for no good reason. 1  I have felt scared or panicky for no good reason. 0  Things have been getting on top of me. 0  I have been so unhappy that I have had difficulty sleeping. 0  I have felt sad or miserable. 1  I have been so unhappy that I have been crying. 1  The thought of harming myself has occurred to me. 0  Edinburgh Postnatal Depression Scale Total 5  After visit meds:  Allergies as of 04/19/2021       Reactions   Other    Nickel        Medication List     STOP taking these medications    aspirin 81 MG EC tablet   glucose blood test strip   omeprazole 20 MG capsule Commonly known as: PRILOSEC   onetouch ultrasoft lancets   polyethylene glycol powder 17 GM/SCOOP powder Commonly known as: GLYCOLAX/MIRALAX       TAKE these medications    ibuprofen 600 MG tablet Commonly known as: ADVIL Take 1 tablet (600 mg total) by mouth every 6 (six) hours.   OneTouch Verio w/Device Kit Use as directed to check blood sugar 4 times daily   PRENATAL  VITAMIN PO Take by mouth.         Discharge home in stable condition Infant Feeding: Breast Infant Disposition:home with mother Discharge instruction: per After Visit Summary and Postpartum booklet. Activity: Advance as tolerated. Pelvic rest for 6 weeks.  Diet: routine diet Future Appointments: Future Appointments  Date Time Provider Department Center  05/24/2021  1:30 PM Cresenzo-Dishmon, Scarlette Calico, CNM [facility]   Follow up Visit:  Follow-up Information     [facility]. Go in 4 week(s).   Specialty: Obstetrics and Gynecology Contact information: [address] [place] [address] [phone]                 Please schedule this patient for a In person postpartum visit in 6 weeks with the following provider:  CNM . Additional Postpartum F/U: None . Needs GTT at 6 wk PP visit High risk pregnancy complicated by: GDM Delivery mode:  Vaginal, Spontaneous  Anticipated Birth Control:   Phexxi   04/19/2021 Donette Larry, CNM

## 2021-04-18 NOTE — Lactation Note (Addendum)
Lactation Consultation Note  Patient Name: Donna Mcknight [id number] Date: 04/18/2021 Reason for consult: Initial assessment;Term;1st time breastfeeding Age:26 y.o.  P1 mom.  Mom taught to hand express and drops of milk noted.  Assisted mom to latch on right side.  Infant breast fed well.  Baby had rhythmic sucking with deep jaw drops and sustained latch on right side in laid back position.  Mom was able to sustain latch and keep infant active at the breast.  Encouraged mom to do sts, hand expression, feed infant when feeding cues are noticed at least 8-12 times in 24 hours and outpatient resources provided.  Mom encouraged to call for assistance as needed.     Maternal Data Has patient been taught Hand Expression?: Yes Does the patient have breastfeeding experience prior to this delivery?: No  Feeding Mother's Current Feeding Choice: Breast Milk  LATCH Score Latch: Repeated attempts needed to sustain latch, nipple held in mouth throughout feeding, stimulation needed to elicit sucking reflex.  Audible Swallowing: Spontaneous and intermittent  Type of Nipple: Everted at rest and after stimulation  Comfort (Breast/Nipple): Soft / non-tender  Hold (Positioning): Assistance needed to correctly position infant at breast and maintain latch.  LATCH Score: 8   Lactation Tools Discussed/Used    Interventions Interventions: Breast feeding basics reviewed;Assisted with latch;Skin to skin;Breast massage;Hand express;Breast compression;Support pillows;Position options;Expressed milk;LC Services brochure  Discharge    Consult Status Consult Status: Follow-up Date: 04/19/21 Follow-up type: In-patient    Danne Harbor 04/18/2021, 10:36 PM

## 2021-04-19 DIAGNOSIS — D62 Acute posthemorrhagic anemia: Secondary | ICD-10-CM | POA: Diagnosis not present

## 2021-04-19 LAB — CBC
HCT: 23 % — ABNORMAL LOW (ref 36.0–46.0)
Hemoglobin: 7.3 g/dL — ABNORMAL LOW (ref 12.0–15.0)
MCH: 26.4 pg (ref 26.0–34.0)
MCHC: 31.7 g/dL (ref 30.0–36.0)
MCV: 83 fL (ref 80.0–100.0)
Platelets: 259 10*3/uL (ref 150–400)
RBC: 2.77 MIL/uL — ABNORMAL LOW (ref 3.87–5.11)
RDW: 15.2 % (ref 11.5–15.5)
WBC: 14.8 10*3/uL — ABNORMAL HIGH (ref 4.0–10.5)
nRBC: 0 % (ref 0.0–0.2)

## 2021-04-19 MED ORDER — IRON SUCROSE 20 MG/ML IV SOLN
500.0000 mg | Freq: Once | INTRAVENOUS | Status: AC
  Administered 2021-04-19: 500 mg via INTRAVENOUS
  Filled 2021-04-19 (×2): qty 25

## 2021-04-19 MED ORDER — IBUPROFEN 600 MG PO TABS
600.0000 mg | ORAL_TABLET | Freq: Four times a day (QID) | ORAL | 0 refills | Status: DC
Start: 2021-04-19 — End: 2021-06-21

## 2021-04-19 MED ORDER — SODIUM CHLORIDE 0.9 % IV SOLN: INTRAVENOUS | Status: DC | PRN

## 2021-04-19 NOTE — Lactation Note (Signed)
This note was copied from a baby's chart. Lactation Consultation Note  Patient Name: Donna Mcknight FVCBS'W Date: 04/19/2021 Reason for consult: Follow-up assessment Age:26 years   Lactation Follow Up Consult:  Visited with family prior to discharge.  Mother had no questions/concerns related to breast feeding at this time.  Reviewed feeding plan for after discharge.  Engorgement prevention/treatment discussed.  Mother has a DEBP for home use.  Baby has been discharged, however, mother will be receiving an iron transfusion prior to discharge.  RN updated.  Mother has our OP phone number for any further concerns.  Father and family member present.   Maternal Data    Feeding    LATCH Score                    Lactation Tools Discussed/Used    Interventions Interventions: Education  Discharge Discharge Education: Engorgement and breast care  Consult Status Consult Status: Complete Date: 04/19/21 Follow-up type: Call as needed    Donna Mcknight R Donna Mcknight 04/19/2021, 3:57 PM

## 2021-04-19 NOTE — Social Work (Signed)
CSW received consult for hx of Anxiety and Depression. CSW met with MOB to offer support and complete assessment.    CSW met with MOB at bedside and introduced CSW role. CSW observed MOB sitting up in bed and FOB in the recliner holding the infant. MOB presented calm and welcomed CSW visit. CSW inquired how MOB has felt since giving birth. MOB reported feeling good. MOB shared that she was able to follow her birth plan with no epidural and reported having a good experience. CSW inquired how MOB felt emotionally during the pregnancy. MOB reported overall she felt good but did have moments where she felt stress. MOB felt like her emotions were more hormonal. MOB shared she was diagnosed with anxiety and depression at age 26 but reported she has not had any concerns. MOB reported in the past she saw a therapist and took Zoloft and Klonopin for her symptoms. MOB reported she did not like taking the medications. MOB reported she is concerned about PPD so has been proactive in preparing for the postpartum period. MOB shared will have a Capsule Placenta completed.She will continue implementing coping strategies such as aroma therapy with candles and essential oils. MOB shared she practices deep breathing and meditation as well.MOB identified her spouse, and in laws and her mom as supports. MOB shared her mom plans to stay with her for the next two weeks to provide support. MOB denied thoughts of her harm to self and others.   CSW provided education regarding the baby blues period vs. perinatal mood disorders, discussed treatment and gave resources for mental health follow up if concerns arise.  CSW recommended MOB complete a self-evaluation during the postpartum time period using the New Mom Checklist from Postpartum Progress and encouraged MOB to contact a medical professional if symptoms are noted at any time. MOB reported she feels comfortable reaching out to her doctor if she has concerns.   CSW provided review  of Sudden Infant Death Syndrome (SIDS) precautions. MOB reported she has essential items for the infant including a bassinet where the infant will sleep. MOB has chosen [facility] for the infants follow up care. CSW assessed MOB for additional needs. MOB reported no further need.   CSW identifies no further need for intervention and no barriers to discharge at this time.   Kathrin Greathouse, MSW, LCSW [facility] [place] Worker  [phone] 04/19/2021  12:47 PM

## 2021-04-26 ENCOUNTER — Encounter: Payer: Self-pay | Admitting: Women's Health

## 2021-05-03 ENCOUNTER — Telehealth (HOSPITAL_COMMUNITY): Payer: Self-pay | Admitting: *Deleted

## 2021-05-03 NOTE — Telephone Encounter (Signed)
Attempted hospital discharge follow-up call. Left message for patient to return RN call. Erline Levine, RN, 05/03/21, [phone]

## 2021-05-24 ENCOUNTER — Ambulatory Visit (INDEPENDENT_AMBULATORY_CARE_PROVIDER_SITE_OTHER): Payer: BC Managed Care – PPO | Admitting: Advanced Practice Midwife

## 2021-05-24 ENCOUNTER — Other Ambulatory Visit: Payer: Self-pay

## 2021-05-24 ENCOUNTER — Encounter: Payer: Self-pay | Admitting: Advanced Practice Midwife

## 2021-05-24 DIAGNOSIS — F419 Anxiety disorder, unspecified: Secondary | ICD-10-CM | POA: Diagnosis not present

## 2021-05-24 DIAGNOSIS — F32A Depression, unspecified: Secondary | ICD-10-CM | POA: Diagnosis not present

## 2021-05-24 MED ORDER — BUPROPION HCL ER (XL) 150 MG PO TB24: 150.0000 mg | ORAL_TABLET | Freq: Every day | ORAL | 6 refills | Status: DC

## 2021-05-24 MED ORDER — PHEXXI 1.8-1-0.4 % VA GEL: VAGINAL | 99 refills | Status: DC

## 2021-05-24 NOTE — Progress Notes (Signed)
Post Partum Visit Note   Chief Complaint:   Postpartum Care (Discuss birth control)  History of Present Illness:   Donna Mcknight is a 26 y.o. G32P1001 Caucasian female being seen today for a postpartum visit. She is 5 weeks postpartum following a spontaneous vaginal delivery at 40 gestational weeks. IOL: Yes, for A1 DM. Anesthesia: none.  Laceration: 2nd degree.  Complications: none. Inpatient contraception: no.   Pregnancy complicated by [id number] . Tobacco use: no. Substance use disorder: no. Last pap smear: 5-9/22 and results were NILM w/ HRHPV negative. Next pap smear due: 2025 No LMP recorded. (Menstrual status: Lactating).  Postpartum course has been uncomplicated. Bleeding staining only. Bowel function is normal. Bladder function is normal. Urinary incontinence? No, fecal incontinence? No had some blood w/hard BM today--happened once during pregnancy as well  Patient is not sexually active. Last sexual activity: prior to birth.    [facility]stream - 05/24/21 1400       Pregnancy Intention Screening   Does the patient want to become pregnant in the next year? No    Does the patient's partner want to become pregnant in the next year? No    Would the patient like to discuss contraceptive options today? Yes      Contraception Wrap Up   Current Method Abstinence    End Method Abstinence    Contraception Counseling Provided Yes            The pregnancy intention screening data noted above was reviewed. Potential methods of contraception were discussed. The patient elected to proceed with Abstinence.  Edinburgh Postpartum Depression Screening: Positive denies SI, HI   Offered therapy referral, declined for now  Has researched Wellbutrin, would like to try.   Edinburgh Postnatal Depression Scale - 05/24/21 1357       Edinburgh Postnatal Depression Scale:  In the Past 7 Days   I have been able to laugh and see the funny side of things. 1    I have looked forward with enjoyment to  things. 1    I have blamed myself unnecessarily when things went wrong. 2    I have been anxious or worried for no good reason. 3    I have felt scared or panicky for no good reason. 2    Things have been getting on top of me. 2    I have been so unhappy that I have had difficulty sleeping. 1    I have felt sad or miserable. 2    I have been so unhappy that I have been crying. 2    The thought of harming myself has occurred to me. 1    Edinburgh Postnatal Depression Scale Total 17            Baby's course has been uncomplicated. Baby is feeding by breast and bottle: milk supply adequate. Infant has a pediatrician/family doctor? Yes.  Childcare strategy if returning to work/school: yes.  Pt has material needs met for her and baby: Yes.   Review of Systems:   Pertinent items are noted in HPI Denies Abnormal vaginal discharge w/ itching/odor/irritation, headaches, visual changes, shortness of breath, chest pain, abdominal pain, severe nausea/vomiting, or problems with urination or bowel movements. Pertinent History Reviewed:  Reviewed past medical,surgical, obstetrical and family history.  Reviewed problem list, medications and allergies. OB History  Gravida Para Term Preterm AB Living  _0 0 0 1  SAB IAB Ectopic Multiple Live Births  0 0 0 0 1    #  Outcome Date GA Lbr Len/2nd Weight Sex Delivery Anes PTL Lv  1 Term 04/18/21 45w0d06:03 / 00:42 7 lb 10 oz (3.459 kg) F Vag-Spont Local  LIV     Birth Comments: none   Physical Assessment:   Vitals:   05/24/21 1340  BP: 104/69  Pulse: 78  Weight: 168 lb 6.4 oz (76.4 kg)  Height: 5' 5.2" (1.656 m)  Body mass index is 27.85 kg/m.  Objective:  Blood pressure 104/69, pulse 78, height 5' 5.2" (1.656 m), weight 168 lb 6.4 oz (76.4 kg), currently breastfeeding.  General:  alert, cooperative, and no distress   Breasts:  negative  Lungs: Normal respiratory effort  Heart:  regular rate and rhythm  Abdomen: soft, non-tender     Vulva:  normal  Vagina: normal vagina  Cervix:  normal  Corpus: Well involuted  Adnexa:  not evaluated  Rectal Exam: No visible hemorrhoids          No results found for this or any previous visit (from the past 24 hour(s)).  Assessment & Plan:  1) Postpartum exam 2) 5 wks s/p spontaneous vaginal delivery 3) breast & bottle feeding 4) Depression screening 5) Contraception management: rx Phexxi  Essential components of care per ACOG recommendations:  1.  Mood and well being:  If positive depression screen, discussed and plan developed. Rx wellburtrin 1579mdaily.  Mood check 4 weeks If using tobacco we discussed reduction/cessation and risk of relapse If current substance abuse, we discussed and referral to local resources was offered.   2. Infant care and feeding:  If breastfeeding, discussed returning to work, pumping, breastfeeding-associated pain, guidance regarding return to fertility while lactating if not using another method. If needed, patient was provided with a letter to be allowed to pump q 2-3hrs to support lactation in a private location with access to a refrigerator to store breastmilk.   Recommended that all caregivers be immunized for flu, pertussis and other preventable communicable diseases If pt does not have material needs met for her/baby, referred to local resources for help obtaining these.  3. Sexuality, contraception and birth spacing Provided guidance regarding sexuality, management of dyspareunia, and resumption of intercourse Discussed avoiding interpregnancy interval <76m65m and recommended birth spacing of 18 months  4. Sleep and fatigue Discussed coping options for fatigue and sleep disruption Encouraged family/partner/community support of 4 hrs of uninterrupted sleep to help with mood and fatigue  5. Physical recovery  If pt had a C/S, assessed incisional pain and providing guidance on normal vs prolonged recovery If pt had a laceration, perineal  healing and pain reviewed.  If urinary or fecal incontinence, discussed management and referred to PT or uro/gyn if indicated  Patient is safe to resume physical activity. Discussed attainment of healthy weight.  6.  Chronic disease management Discussed pregnancy complications if any, and their implications for future childbearing and long-term maternal health. Review recommendations for prevention of recurrent pregnancy complications, such as 17 hydroxyprogesterone caproate to reduce risk for recurrent PTB not applicable, or aspirin to reduce risk of preeclampsia not applicable. Pt had GDM: Yes. Will check FBS for a week, let us [place] if >95: . Reviewed medications and non-pregnant dosing including consideration of whether pt is breastfeeding using a reliable resource such as LactMed: not applicable Referred for f/u w/ PCP or subspecialist providers as indicated: not applicable  7. Health maintenance Mammogram at 40y62yo earlier if indicated Pap smears as indicated  Meds:  Meds ordered this encounter  Medications  buPROPion (WELLBUTRIN XL) 150 MG 24 hr tablet    Sig: Take 1 tablet (150 mg total) by mouth daily.    Dispense:  30 tablet    Refill:  6    Order Specific Question:   Supervising Provider    Answer:   Tania Ade H [[id number]]   Lactic Ac-Citric Ac-Pot Bitart (PHEXXI) 1.8-1-0.4 % GEL    Sig: Insert just prior to intercourse, good for one hour    Dispense:  5 g    Refill:  PRN    Order Specific Question:   Supervising Provider    Answer:   Florian Buff [[id number]]    Follow-up: Return in about 4 weeks (around 06/21/2021) for mychart video med check.   No orders of the defined types were placed in this encounter.      [place] for [employer], [facility] 05/24/2021 2:27 PM

## 2021-06-01 ENCOUNTER — Encounter: Payer: Self-pay | Admitting: Advanced Practice Midwife

## 2021-06-21 ENCOUNTER — Telehealth: Payer: BC Managed Care – PPO | Admitting: Women's Health

## 2021-06-21 ENCOUNTER — Other Ambulatory Visit: Payer: Self-pay

## 2021-06-21 ENCOUNTER — Encounter: Payer: Self-pay | Admitting: Women's Health

## 2021-06-21 DIAGNOSIS — F418 Other specified anxiety disorders: Secondary | ICD-10-CM

## 2021-06-21 MED ORDER — BUPROPION HCL ER (SR) 150 MG PO TB12: 150.0000 mg | ORAL_TABLET | Freq: Two times a day (BID) | ORAL | 6 refills | Status: DC

## 2021-06-21 NOTE — Progress Notes (Signed)
? ?TELEHEALTH VIRTUAL GYN VISIT ENCOUNTER NOTE ?Patient name: Donna Mcknight MRN [id number]  Date of birth: 04/03/1996 ? ?I connected with patient on 06/21/21 at  4:10 PM EDT by MyChart video  and verified that I am speaking with the correct person using two identifiers.  Pt is not currently in the office, she is in car.  Provider is in the office.  ?  ?I discussed the limitations, risks, security and privacy concerns of performing an evaluation and management service by telephone and the availability of in person appointments. I also discussed with the patient that there may be a patient responsible charge related to this service. The patient expressed understanding and agreed to proceed. ?  ?Chief Complaint:   ?Follow-up ? ?History of Present Illness:   ?CAIT REDDIG is a 26 y.o. G62P1001 Caucasian female postpartum, being evaluated today for f/u on wellbutrin 150mg  XL rx'd on 05/24/21 at postpartum visit for dep/anx.   She feels much better, but still not where she was before having the baby. Feels she wants to increase the dosage. Does have h/o dep/anx, had tried zoloft, klonopin, etc in past and didn't like the way they made her feel. Likes wellbutrin much better so far. Denies SI/HI/II.  ?Depression screen [facility] 2/9 06/21/2021 01/08/2021 10/14/2020 08/17/2020 02/10/2020  ?Decreased Interest 1 0 0 0 1  ?Down, Depressed, Hopeless 1 0 0 0 1  ?PHQ - 2 Score 2 0 0 0 2  ?Altered sleeping 1 0 1 0 0  ?Tired, decreased energy 3 2 1 1  0  ?Change in appetite 0 1 3 2 1   ?Feeling bad or failure about yourself  1 0 0 0 0  ?Trouble concentrating 1 0 0 0 0  ?Moving slowly or fidgety/restless 0 0 0 0 0  ?Suicidal thoughts 0 0 0 0 0  ?PHQ-9 Score 8 3 5 3 3   ? ? ?No LMP recorded. (Menstrual status: Lactating). ?The current method of family planning is  Phexxi .  ?Last pap 08/17/20. Results were:  NILM w/ HRHPV negative ?Review of Systems:   ?Pertinent items are noted in HPI ?Denies fever/chills, dizziness,  headaches, visual disturbances, fatigue, shortness of breath, chest pain, abdominal pain, vomiting, abnormal vaginal discharge/itching/odor/irritation, problems with periods, bowel movements, urination, or intercourse unless otherwise stated above.  ?Pertinent History Reviewed:  ?Reviewed past medical,surgical, social, obstetrical and family history.  ?Reviewed problem list, medications and allergies. ?Physical Assessment:  ?There were no vitals filed for this visit.There is no height or weight on file to calculate BMI. ? ?     Physical Examination:   ?General:  Alert, oriented and cooperative.   ?Mental Status: Normal mood and affect perceived. Normal judgment and thought content.  ?Physical exam deferred due to nature of the encounter ? ?No results found for this or any previous visit (from the past 24 hour(s)).  ?Assessment & Plan:  ?1) postpartum, depression/anxiety> doing well on wellbutrin 150mg , wants to increase. Rx wellbutrin 150mg  BID, f/u 4wks ? ?Meds:  ?Meds ordered this encounter  ?Medications  ? buPROPion (WELLBUTRIN SR) 150 MG 12 hr tablet  ?  Sig: Take 1 tablet (150 mg total) by mouth 2 (two) times daily.  ?  Dispense:  60 tablet  ?  Refill:  6  ?  Order Specific Question:   Supervising Provider  ?  Answer:   Duane Lope H [[id number]]  ? ? ?No orders of the defined types were placed in this encounter. ? ? ?I  discussed the assessment and treatment plan with the patient. The patient was provided an opportunity to ask questions and all were answered. The patient agreed with the plan and demonstrated an understanding of the instructions. ?  ?The patient was advised to call back or seek an in-person evaluation/go to the ED if the symptoms worsen or if the condition fails to improve as anticipated. ? ?I provided 10 minutes of non-face-to-face time during this encounter. ? ? ?Return in about 4 weeks (around 07/19/2021) for med f/u, CNM, in person or online. ? ?Cheral Marker CNM,  WHNP-BC ?06/21/2021 ?4:49 PM   ?

## 2021-06-23 ENCOUNTER — Encounter: Payer: Self-pay | Admitting: Women's Health

## 2021-06-24 ENCOUNTER — Other Ambulatory Visit: Payer: Self-pay | Admitting: Women's Health

## 2021-06-24 MED ORDER — BUPROPION HCL ER (XL) 300 MG PO TB24: 300.0000 mg | ORAL_TABLET | Freq: Every day | ORAL | 11 refills | Status: DC

## 2021-07-22 ENCOUNTER — Telehealth: Payer: BC Managed Care – PPO | Admitting: Women's Health

## 2021-07-27 ENCOUNTER — Telehealth: Payer: BC Managed Care – PPO | Admitting: Women's Health

## 2021-07-27 ENCOUNTER — Encounter: Payer: Self-pay | Admitting: *Deleted

## 2021-07-27 ENCOUNTER — Encounter: Payer: Self-pay | Admitting: Women's Health

## 2021-07-27 DIAGNOSIS — F418 Other specified anxiety disorders: Secondary | ICD-10-CM

## 2021-07-27 MED ORDER — BUPROPION HCL ER (XL) 450 MG PO TB24: 450.0000 mg | ORAL_TABLET | Freq: Every day | ORAL | 11 refills | Status: DC

## 2021-07-27 NOTE — Progress Notes (Signed)
? ?TELEHEALTH VIRTUAL GYN VISIT ENCOUNTER NOTE ?Patient name: Donna Mcknight MRN [id number]  Date of birth: [birth year] ? ?I connected with patient on 07/27/21 at 11:50 AM EDT by MyChart video  and verified that I am speaking with the correct person using two identifiers.  Pt is not currently in the office, she is at work.  Provider is in the office.  ?  ?I discussed the limitations, risks, security and privacy concerns of performing an evaluation and management service by telephone and the availability of in person appointments. I also discussed with the patient that there may be a patient responsible charge related to this service. The patient expressed understanding and agreed to proceed. ?  ?Chief Complaint:   ?Follow-up (On meds) ? ?History of Present Illness:   ?Donna Mcknight is a 26 y.o. G90P1001 Caucasian female s/p SVB, being evaluated today for f/u on increase in wellbutrin to 300mg  on 3/13 for dep/anx, feels it helped better initially than it is right now. Still feels better than she did prior to starting wellbutrin in Feb. Denies SI/HI/II. Eating/sleeping well, good support, still finds joy in things she used to. Declines counseling/therapy.  Would like to try increasing dosage. EDPS today 63, was 8 on 3/13.  ? ?  06/21/2021  ?  4:28 PM 01/08/2021  ?  9:07 AM 10/14/2020  ? 10:21 AM 08/17/2020  ?  9:36 AM 02/10/2020  ?  8:52 AM  ?Depression screen PHQ 2/9  ?Decreased Interest 1 0 0 0 1  ?Down, Depressed, Hopeless 1 0 0 0 1  ?PHQ - 2 Score 2 0 0 0 2  ?Altered sleeping 1 0 1 0 0  ?Tired, decreased energy 3 2 1 1  0  ?Change in appetite 0 1 3 2 1   ?Feeling bad or failure about yourself  1 0 0 0 0  ?Trouble concentrating 1 0 0 0 0  ?Moving slowly or fidgety/restless 0 0 0 0 0  ?Suicidal thoughts 0 0 0 0 0  ?PHQ-9 Score 8 3 5 3 3   ? ? ?No LMP recorded. (Menstrual status: Lactating). ?The current method of family planning is  phexxi .  ?Last pap 08/17/20. Results were:  NILM w/ HRHPV  negative ?Review of Systems:   ?Pertinent items are noted in HPI ?Denies fever/chills, dizziness, headaches, visual disturbances, fatigue, shortness of breath, chest pain, abdominal pain, vomiting, abnormal vaginal discharge/itching/odor/irritation, problems with periods, bowel movements, urination, or intercourse unless otherwise stated above.  ?Pertinent History Reviewed:  ?Reviewed past medical,surgical, social, obstetrical and family history.  ?Reviewed problem list, medications and allergies. ?Physical Assessment:  ?There were no vitals filed for this visit.There is no height or weight on file to calculate BMI. ? ?     Physical Examination:   ?General:  Alert, oriented and cooperative.   ?Mental Status: Normal mood and affect perceived. Normal judgment and thought content.  ?Physical exam deferred due to nature of the encounter ? ?No results found for this or any previous visit (from the past 24 hour(s)).  ?Assessment & Plan:  ?1) postpartum, Dep/anx> wants to increase wellbutrin, rx sent for 450mg  daily, f/u 4wks. Declines IBH/therapy right now ? ?Meds:  ?Meds ordered this encounter  ?Medications  ? buPROPion 450 MG TB24  ?  Sig: Take 450 mg by mouth daily.  ?  Dispense:  30 tablet  ?  Refill:  11  ?  Order Specific Question:   Supervising Provider  ?  Answer:   Despina Hidden,  LUTHER H [[id number]]  ? ? ?No orders of the defined types were placed in this encounter. ? ? ?I discussed the assessment and treatment plan with the patient. The patient was provided an opportunity to ask questions and all were answered. The patient agreed with the plan and demonstrated an understanding of the instructions. ?  ?The patient was advised to call back or seek an in-person evaluation/go to the ED if the symptoms worsen or if the condition fails to improve as anticipated. ? ?I provided 15 minutes of non-face-to-face time during this encounter. ? ? ?Return in about 4 weeks (around 08/24/2021) for med f/u, CNM, in person or  online. ? ?Cheral Marker CNM, WHNP-BC ?07/27/2021 ?12:19 PM   ?

## 2021-08-23 ENCOUNTER — Encounter: Payer: Self-pay | Admitting: *Deleted

## 2021-08-23 ENCOUNTER — Telehealth: Payer: BC Managed Care – PPO | Admitting: Women's Health

## 2021-08-23 ENCOUNTER — Encounter: Payer: Self-pay | Admitting: Women's Health

## 2021-08-23 DIAGNOSIS — F418 Other specified anxiety disorders: Secondary | ICD-10-CM

## 2021-08-23 NOTE — Progress Notes (Signed)
? ?TELEHEALTH VIRTUAL GYN VISIT ENCOUNTER NOTE ?Patient name: Donna Mcknight MRN [id number]  Date of birth: March 11, 1996 ? ?I connected with patient on 08/23/21 at  8:30 AM EDT by MyChart video  and verified that I am speaking with the correct person using two identifiers.  Pt is not currently in the office, she is at home.  Provider is in the office.  ?  ?I discussed the limitations, risks, security and privacy concerns of performing an evaluation and management service by telephone and the availability of in person appointments. I also discussed with the patient that there may be a patient responsible charge related to this service. The patient expressed understanding and agreed to proceed. ?  ?Chief Complaint:   ?Follow-up (Depression/anxiety) ? ?History of Present Illness:   ?Donna Mcknight is a 26 y.o. G63P1001 Caucasian female s/p SVB, being evaluated today for f/u on increase of wellbutrin to 450mg  on 07/27/21 for dep/anx. Feels much better on this dose, still has anxiety here and there, but manageable. EPDS today 6 (was 11 on 4/18), PHQ9=1 (was 8 on 3/13), GAD7=5.  ? ?  08/23/2021  ?  8:38 AM 06/21/2021  ?  4:28 PM 01/08/2021  ?  9:07 AM 10/14/2020  ? 10:21 AM 08/17/2020  ?  9:36 AM  ?Depression screen PHQ 2/9  ?Decreased Interest 0 1 0 0 0  ?Down, Depressed, Hopeless 0 1 0 0 0  ?PHQ - 2 Score 0 2 0 0 0  ?Altered sleeping 0 1 0 1 0  ?Tired, decreased energy 1 3 2 1 1   ?Change in appetite 0 0 1 3 2   ?Feeling bad or failure about yourself  0 1 0 0 0  ?Trouble concentrating 0 1 0 0 0  ?Moving slowly or fidgety/restless 0 0 0 0 0  ?Suicidal thoughts 0 0 0 0 0  ?PHQ-9 Score 1 8 3 5 3   ? ? ?No LMP recorded. (Menstrual status: Lactating). ?The current Mcknight of family planning is  phexxi .  ?Last pap 08/17/20. Results were:  NILM w/ HRHPV negative ?Review of Systems:   ?Pertinent items are noted in HPI ?Denies fever/chills, dizziness, headaches, visual disturbances, fatigue, shortness of breath, chest  pain, abdominal pain, vomiting, abnormal vaginal discharge/itching/odor/irritation, problems with periods, bowel movements, urination, or intercourse unless otherwise stated above.  ?Pertinent History Reviewed:  ?Reviewed past medical,surgical, social, obstetrical and family history.  ?Reviewed problem list, medications and allergies. ?Physical Assessment:  ?There were no vitals filed for this visit.There is no height or weight on file to calculate BMI. ? ?     Physical Examination:   ?General:  Alert, oriented and cooperative.   ?Mental Status: Normal mood and affect perceived. Normal judgment and thought content.  ?Physical exam deferred due to nature of the encounter ? ?No results found for this or any previous visit (from the past 24 hour(s)).  ?Assessment & Plan:  ?1) postpartum, dep/anx> doing well on wellbutrin 450mg  daily, continue. Let me know if needs anything ? ?Meds: No orders of the defined types were placed in this encounter. ? ? ?No orders of the defined types were placed in this encounter. ? ? ?I discussed the assessment and treatment plan with the patient. The patient was provided an opportunity to ask questions and all were answered. The patient agreed with the plan and demonstrated an understanding of the instructions. ?  ?The patient was advised to call back or seek an in-person evaluation/go to the ED if  the symptoms worsen or if the condition fails to improve as anticipated. ? ?I provided 10 minutes of non-face-to-face time during this encounter. ? ? ?Return for April 2024 for physical. ? ?Cheral Marker CNM, WHNP-BC ?08/23/2021 ?9:02 AM   ?

## 2021-10-08 DIAGNOSIS — M25571 Pain in right ankle and joints of right foot: Secondary | ICD-10-CM | POA: Diagnosis not present

## 2021-11-05 DIAGNOSIS — M25571 Pain in right ankle and joints of right foot: Secondary | ICD-10-CM | POA: Diagnosis not present

## 2021-11-08 DIAGNOSIS — M25571 Pain in right ankle and joints of right foot: Secondary | ICD-10-CM | POA: Diagnosis not present

## 2021-11-08 DIAGNOSIS — M25671 Stiffness of right ankle, not elsewhere classified: Secondary | ICD-10-CM | POA: Diagnosis not present

## 2021-11-15 DIAGNOSIS — M25671 Stiffness of right ankle, not elsewhere classified: Secondary | ICD-10-CM | POA: Diagnosis not present

## 2021-11-15 DIAGNOSIS — M25571 Pain in right ankle and joints of right foot: Secondary | ICD-10-CM | POA: Diagnosis not present

## 2021-11-22 DIAGNOSIS — M25571 Pain in right ankle and joints of right foot: Secondary | ICD-10-CM | POA: Diagnosis not present

## 2021-11-22 DIAGNOSIS — M25671 Stiffness of right ankle, not elsewhere classified: Secondary | ICD-10-CM | POA: Diagnosis not present

## 2021-11-25 DIAGNOSIS — M25671 Stiffness of right ankle, not elsewhere classified: Secondary | ICD-10-CM | POA: Diagnosis not present

## 2021-11-25 DIAGNOSIS — M25571 Pain in right ankle and joints of right foot: Secondary | ICD-10-CM | POA: Diagnosis not present

## 2021-11-29 DIAGNOSIS — M25571 Pain in right ankle and joints of right foot: Secondary | ICD-10-CM | POA: Diagnosis not present

## 2021-11-29 DIAGNOSIS — M25671 Stiffness of right ankle, not elsewhere classified: Secondary | ICD-10-CM | POA: Diagnosis not present

## 2021-12-02 DIAGNOSIS — M25671 Stiffness of right ankle, not elsewhere classified: Secondary | ICD-10-CM | POA: Diagnosis not present

## 2021-12-02 DIAGNOSIS — M25571 Pain in right ankle and joints of right foot: Secondary | ICD-10-CM | POA: Diagnosis not present

## 2021-12-06 DIAGNOSIS — M25671 Stiffness of right ankle, not elsewhere classified: Secondary | ICD-10-CM | POA: Diagnosis not present

## 2021-12-06 DIAGNOSIS — M25571 Pain in right ankle and joints of right foot: Secondary | ICD-10-CM | POA: Diagnosis not present

## 2021-12-09 DIAGNOSIS — M25571 Pain in right ankle and joints of right foot: Secondary | ICD-10-CM | POA: Diagnosis not present

## 2021-12-09 DIAGNOSIS — M25671 Stiffness of right ankle, not elsewhere classified: Secondary | ICD-10-CM | POA: Diagnosis not present

## 2022-01-08 ENCOUNTER — Ambulatory Visit
Admission: EM | Admit: 2022-01-08 | Discharge: 2022-01-08 | Disposition: A | Discharge status: Home / Self Care | Payer: BC Managed Care – PPO

## 2022-01-08 DIAGNOSIS — J209 Acute bronchitis, unspecified: Secondary | ICD-10-CM

## 2022-01-08 LAB — POCT RAPID STREP A (OFFICE): Rapid Strep A Screen: NEGATIVE

## 2022-01-08 MED ORDER — ALBUTEROL SULFATE HFA 108 (90 BASE) MCG/ACT IN AERS: 2.0000 | INHALATION_SPRAY | RESPIRATORY_TRACT | 0 refills | Status: DC | PRN

## 2022-01-08 MED ORDER — PREDNISONE 20 MG PO TABS: 40.0000 mg | ORAL_TABLET | Freq: Every day | ORAL | 0 refills | Status: DC

## 2022-01-08 MED ORDER — PROMETHAZINE-DM 6.25-15 MG/5ML PO SYRP: 5.0000 mL | ORAL_SOLUTION | Freq: Four times a day (QID) | ORAL | 0 refills | Status: DC | PRN

## 2022-01-08 NOTE — ED Provider Notes (Signed)
[facility]    CSN: [id number] Arrival date & time: 01/08/22  1338      History   Chief Complaint Chief Complaint  Patient presents with   Sore Throat   Chest Pain   Shortness of Breath         HPI Donna Mcknight is a 26 y.o. female.   Patient presenting today with 5 to 6-day history of cough, fatigue, fevers off-and-on, sore throat, congestion and now the last 2 days having some chest tightness, chest pain with deep breaths and coughing, shortness of breath additionally.  She denies abdominal pain, nausea vomiting diarrhea, rashes.  States her daughter just got over RSV and she has had the same symptoms.  Trying DayQuil, NyQuil with minimal relief.  No known history of chronic pulmonary disease.    Past Medical History:  Diagnosis Date   Anxiety    Depression    Gestational diabetes    Mental disorder    anxiety, depression    Patient Active Problem List   Diagnosis Date Noted   Acute blood loss anemia 04/19/2021   Postpartum hemorrhage 04/18/2021   Gestational diabetes mellitus, class A1 01/12/2021   Depression with anxiety 10/14/2020   Screening for cervical cancer 02/19/2020    Past Surgical History:  Procedure Laterality Date   MOUTH SURGERY      OB History     Gravida  1   Para  1   Term  1   Preterm  0   AB  0   Living  1      SAB  0   IAB  0   Ectopic  0   Multiple  0   Live Births  1            Home Medications    Prior to Admission medications   Medication Sig Start Date End Date Taking? Authorizing Provider  albuterol (VENTOLIN HFA) 108 (90 Base) MCG/ACT inhaler Inhale 2 puffs into the lungs every 4 (four) hours as needed for wheezing or shortness of breath. 01/08/22  Yes Volney American, PA-C  predniSONE (DELTASONE) 20 MG tablet Take 2 tablets (40 mg total) by mouth daily with breakfast. 01/08/22  Yes Volney American, PA-C  promethazine-dextromethorphan (PROMETHAZINE-DM) 6.25-15  MG/5ML syrup Take 5 mLs by mouth 4 (four) times daily as needed. 01/08/22  Yes Volney American, PA-C  Pseudoeph-Doxylamine-DM-APAP (DAYQUIL/NYQUIL COLD/FLU RELIEF PO) Take by mouth.   Yes [provider]  buPROPion 450 MG TB24 Take 450 mg by mouth daily. 07/27/21   Roma Schanz, CNM  Lactic Ac-Citric Ac-Pot Bitart (PHEXXI) 1.8-1-0.4 % GEL Insert just prior to intercourse, good for one hour Patient not taking: Reported on 08/23/2021 05/24/21   Cresenzo-Dishmon, Joaquim Lai, CNM  OVER THE COUNTER MEDICATION Take 85 mg by mouth daily. hemaplex    [provider]  Prenatal Vit-Fe Fumarate-FA (PRENATAL VITAMIN PO) Take 1 tablet by mouth daily.    [provider]    Family History Family History  Problem Relation Age of Onset   Hypertension Mother    Fibroids Mother    Cirrhosis Father    Cancer Maternal Grandmother    Diabetes Maternal Grandfather     Social History Social History   Tobacco Use   Smoking status: Never   Smokeless tobacco: Never  Vaping Use   Vaping Use: Former  Substance Use Topics   Alcohol use: Not Currently    Alcohol/week: 10.0 standard drinks of alcohol  Types: 10 Glasses of wine per week    Comment: daily   Drug use: No     Allergies   Nickel and Other   Review of Systems Review of Systems Per HPI  Physical Exam Triage Vital Signs ED Triage Vitals  Enc Vitals Group     BP 01/08/22 1408 129/78     Pulse Rate 01/08/22 1408 98     Resp 01/08/22 1408 18     Temp 01/08/22 1408 98.7 F (37.1 C)     Temp Source 01/08/22 1408 Oral     SpO2 01/08/22 1408 96 %     Weight --      Height --      Head Circumference --      Peak Flow --      Pain Score 01/08/22 1409 7     Pain Loc --      Pain Edu? --      Excl. in GC? --    No data found.  Updated Vital Signs BP 129/78 (BP Location: Right Arm)   Pulse 98   Temp 98.7 F (37.1 C) (Oral)   Resp 18   LMP 12/22/2021 (Exact Date)   SpO2 96%   Breastfeeding No    Visual Acuity Right Eye Distance:   Left Eye Distance:   Bilateral Distance:    Right Eye Near:   Left Eye Near:    Bilateral Near:     Physical Exam Vitals and nursing note reviewed.  Constitutional:      Appearance: Normal appearance.  HENT:     Head: Atraumatic.     Right Ear: Tympanic membrane and external ear normal.     Left Ear: Tympanic membrane and external ear normal.     Nose: Rhinorrhea present.     Mouth/Throat:     Mouth: Mucous membranes are moist.     Pharynx: Posterior oropharyngeal erythema present.     Comments: Bilateral tonsillar erythema, edema without exudates.  Uvula midline, oral airway patent Eyes:     Extraocular Movements: Extraocular movements intact.     Conjunctiva/sclera: Conjunctivae normal.  Cardiovascular:     Rate and Rhythm: Normal rate and regular rhythm.     Heart sounds: Normal heart sounds.  Pulmonary:     Effort: Pulmonary effort is normal.     Breath sounds: Normal breath sounds. No wheezing or rales.  Musculoskeletal:        General: Normal range of motion.     Cervical back: Normal range of motion and neck supple.  Skin:    General: Skin is warm and dry.  Neurological:     Mental Status: She is alert and oriented to person, place, and time.  Psychiatric:        Mood and Affect: Mood normal.        Thought Content: Thought content normal.    UC Treatments / Results  Labs (all labs ordered are listed, but only abnormal results are displayed) Labs Reviewed  POCT RAPID STREP A (OFFICE)    EKG   Radiology No results found.  Procedures Procedures (including critical care time)  Medications Ordered in UC Medications - No data to display  Initial Impression / Assessment and Plan / UC Course  I have reviewed the triage vital signs and the nursing notes.  Pertinent labs & imaging results that were available during my care of the patient were reviewed by me and considered in my medical decision making (see chart  for  details).     Vital signs benign and reassuring, exam suggestive of a viral upper respiratory infection.  Rapid strep negative, declines viral testing given duration of symptoms.  Treat with prednisone, albuterol, Phenergan DM for a postviral bronchitis.  Return for worsening symptoms.  Final Clinical Impressions(s) / UC Diagnoses   Final diagnoses:  Acute bronchitis, unspecified organism   Discharge Instructions   None    ED Prescriptions     Medication Sig Dispense Auth. Provider   predniSONE (DELTASONE) 20 MG tablet Take 2 tablets (40 mg total) by mouth daily with breakfast. 10 tablet Particia Nearing, PA-C   albuterol (VENTOLIN HFA) 108 (90 Base) MCG/ACT inhaler Inhale 2 puffs into the lungs every 4 (four) hours as needed for wheezing or shortness of breath. 18 g Roosvelt Maser [place], [place]   promethazine-dextromethorphan (PROMETHAZINE-DM) 6.25-15 MG/5ML syrup Take 5 mLs by mouth 4 (four) times daily as needed. 100 mL Particia Nearing, [place]      PDMP not reviewed this encounter.   Particia Nearing, [place] 01/08/22 1453

## 2022-01-08 NOTE — ED Triage Notes (Signed)
Pt reports chest congestion, nasal congestion x 1 day  shortness of breath,  sore throat; fever x 2 days. Pt reports her daughter had RSV. DayQuil and NyQuil gives relief.

## 2022-01-16 ENCOUNTER — Emergency Department (HOSPITAL_COMMUNITY)
Admission: EM | Admit: 2022-01-16 | Discharge: 2022-01-16 | Disposition: A | Discharge status: Home / Self Care | Payer: BC Managed Care – PPO | Attending: Emergency Medicine | Admitting: Emergency Medicine

## 2022-01-16 ENCOUNTER — Encounter (HOSPITAL_COMMUNITY): Payer: Self-pay

## 2022-01-16 ENCOUNTER — Other Ambulatory Visit: Payer: Self-pay

## 2022-01-16 ENCOUNTER — Emergency Department (HOSPITAL_COMMUNITY): Payer: BC Managed Care – PPO

## 2022-01-16 DIAGNOSIS — M26622 Arthralgia of left temporomandibular joint: Secondary | ICD-10-CM | POA: Diagnosis not present

## 2022-01-16 DIAGNOSIS — H9202 Otalgia, left ear: Secondary | ICD-10-CM | POA: Insufficient documentation

## 2022-01-16 DIAGNOSIS — M26602 Left temporomandibular joint disorder, unspecified: Secondary | ICD-10-CM | POA: Diagnosis not present

## 2022-01-16 DIAGNOSIS — J019 Acute sinusitis, unspecified: Secondary | ICD-10-CM | POA: Insufficient documentation

## 2022-01-16 DIAGNOSIS — R6884 Jaw pain: Secondary | ICD-10-CM | POA: Diagnosis not present

## 2022-01-16 DIAGNOSIS — M26609 Unspecified temporomandibular joint disorder, unspecified side: Secondary | ICD-10-CM

## 2022-01-16 MED ORDER — PREDNISONE 50 MG PO TABS
60.0000 mg | ORAL_TABLET | Freq: Once | ORAL | Status: AC
  Administered 2022-01-16: 60 mg via ORAL
  Filled 2022-01-16: qty 1

## 2022-01-16 MED ORDER — KETOROLAC TROMETHAMINE 10 MG PO TABS: 10.0000 mg | ORAL_TABLET | Freq: Four times a day (QID) | ORAL | 0 refills | Status: DC | PRN

## 2022-01-16 MED ORDER — CYCLOBENZAPRINE HCL 5 MG PO TABS: 5.0000 mg | ORAL_TABLET | Freq: Every evening | ORAL | 0 refills | Status: AC | PRN

## 2022-01-16 MED ORDER — IBUPROFEN 800 MG PO TABS
800.0000 mg | ORAL_TABLET | Freq: Once | ORAL | Status: AC
  Administered 2022-01-16: 800 mg via ORAL
  Filled 2022-01-16: qty 1

## 2022-01-16 NOTE — ED Provider Notes (Signed)
[facility] EMERGENCY DEPARTMENT Provider Note   CSN: [id number] Arrival date & time: 01/16/22  1337     History  No chief complaint on file.   Donna Mcknight is a 26 y.o. female, who presents to the ED secondary to left jaw pain for the last 2 weeks.  She states that after getting RSV, she has had severe left jaw pain.  Has a history of clicking of her jaw, which she states has been persistent.  Did develop a sinus infection, and has been on antibiotics, swelling of her face has improved in all areas except for her jaw.  She has tried over-the-counter pain relievers without any relief.  Has not tried applying ice or heat to the area.  Does endorse having some history of teeth grinding.  States it is painful to open her mouth.  Also endorses some left ear pain.  Is currently on amoxicillin for her sinus infection.  Denies any tooth pain, last saw a dentist about 6 months ago.    Home Medications Prior to Admission medications   Medication Sig Start Date End Date Taking? Authorizing Provider  cyclobenzaprine (FLEXERIL) 5 MG tablet Take 1 tablet (5 mg total) by mouth at bedtime as needed for up to 10 days for muscle spasms. 01/16/22 01/26/22 Yes Renezmae Canlas L, PA  ketorolac (TORADOL) 10 MG tablet Take 1 tablet (10 mg total) by mouth every 6 (six) hours as needed. 01/16/22  Yes Liel Rudden L, PA  albuterol (VENTOLIN HFA) 108 (90 Base) MCG/ACT inhaler Inhale 2 puffs into the lungs every 4 (four) hours as needed for wheezing or shortness of breath. 01/08/22   Particia Nearing, PA-C  buPROPion 450 MG TB24 Take 450 mg by mouth daily. 07/27/21   Cheral Marker, CNM  Lactic Ac-Citric Ac-Pot Bitart (PHEXXI) 1.8-1-0.4 % GEL Insert just prior to intercourse, good for one hour Patient not taking: Reported on 08/23/2021 05/24/21   Cresenzo-Dishmon, Scarlette Calico, CNM  OVER THE COUNTER MEDICATION Take 85 mg by mouth daily. hemaplex    [provider]  predniSONE (DELTASONE) 20 MG  tablet Take 2 tablets (40 mg total) by mouth daily with breakfast. 01/08/22   Particia Nearing, PA-C  Prenatal Vit-Fe Fumarate-FA (PRENATAL VITAMIN PO) Take 1 tablet by mouth daily.    [provider]  promethazine-dextromethorphan (PROMETHAZINE-DM) 6.25-15 MG/5ML syrup Take 5 mLs by mouth 4 (four) times daily as needed. 01/08/22   Particia Nearing, PA-C  Pseudoeph-Doxylamine-DM-APAP (DAYQUIL/NYQUIL COLD/FLU RELIEF PO) Take by mouth.    [provider]      Allergies    Nickel and Other    Review of Systems   Review of Systems  Constitutional:  Negative for chills and fever.  HENT:  Positive for ear pain and facial swelling. Negative for hearing loss.   Eyes:  Negative for pain and redness.    Physical Exam Updated Vital Signs BP 133/84 (BP Location: Right Arm)   Pulse (!) 108   Temp 98.1 F (36.7 C) (Oral)   Resp 20   Ht 5\' 2"  (1.575 m)   Wt 77.1 kg   LMP 12/22/2021 (Exact Date)   SpO2 100%   BMI 31.09 kg/m  Physical Exam Constitutional:      Appearance: Normal appearance.  HENT:     Head: Normocephalic and atraumatic.     Jaw: Tenderness, swelling and pain on movement present. No trismus.     Comments: Tenderness to palpation of left temporomandibular joint.  Worsening pain  with opening of mouth.  Able to open mouth, mild swelling on the left side of the face, without erythema however.      Right Ear: Tympanic membrane, ear canal and external ear normal.     Left Ear: Tympanic membrane, ear canal and external ear normal.     Nose: Nose normal.     Mouth/Throat:     Mouth: Mucous membranes are moist. No injury.     Dentition: No dental tenderness or dental caries.     Pharynx: Oropharynx is clear. Uvula midline. No pharyngeal swelling or posterior oropharyngeal erythema.     Comments: No dental caries noted in the mouth.  No fluctuance inside the mouth. Eyes:     Extraocular Movements: Extraocular movements intact.     Conjunctiva/sclera:  Conjunctivae normal.     Pupils: Pupils are equal, round, and reactive to light.  Cardiovascular:     Rate and Rhythm: Normal rate and regular rhythm.  Pulmonary:     Effort: Pulmonary effort is normal.  Musculoskeletal:        General: Normal range of motion.     Cervical back: Normal range of motion and neck supple.  Skin:    General: Skin is warm and dry.  Neurological:     General: No focal deficit present.     Mental Status: She is alert and oriented to person, place, and time.  Psychiatric:        Mood and Affect: Mood normal.     ED Results / Procedures / Treatments   Labs (all labs ordered are listed, but only abnormal results are displayed) Labs Reviewed - No data to display  EKG None  Radiology CT Maxillofacial Wo Contrast  Result Date: 01/16/2022 CLINICAL DATA:  Left jaw pain for 2 weeks.  Concern for dislocation. EXAM: CT MAXILLOFACIAL WITHOUT CONTRAST TECHNIQUE: Multidetector CT imaging of the maxillofacial structures was performed. Multiplanar CT image reconstructions were also generated. RADIATION DOSE REDUCTION: This exam was performed according to the departmental dose-optimization program which includes automated exposure control, adjustment of the mA and/or kV according to patient size and/or use of iterative reconstruction technique. COMPARISON:  None Available. FINDINGS: Osseous: No evidence of acute fracture or dislocation. The mandible and temporomandibular joints appear normal. Both mandibular heads are normally located, without erosive changes. No evidence of TMJ joint effusion. Limited assessment of the TMJ menisci by CT. Orbits: Both globes are intact. No orbital inflammatory changes are identified. The optic nerves and extraocular muscles appear normal. Sinuses: Diffuse opacification of the paranasal sinuses with scattered possible air-fluid levels. The mastoid air cells and middle ears are clear. Soft tissues: The soft tissues of the face appear  unremarkable. The parotid glands appear normal bilaterally. Valari Taylor cervical lymph nodes bilaterally are not pathologically enlarged, likely reactive. Limited intracranial: No intracranial abnormalities are identified. IMPRESSION: 1. No evidence of acute fracture or dislocation. 2. The mandible and temporomandibular joints appear normal by CT. 3. Diffuse opacification of the paranasal sinuses with scattered possible air-fluid levels suspicious for acute on chronic sinusitis. Correlate clinically. Electronically Signed   By: Carey Bullocks M.D.   On: 01/16/2022 15:34    Procedures Procedures   Medications Ordered in ED Medications  ibuprofen (ADVIL) tablet 800 mg (800 mg Oral Given 01/16/22 1446)  predniSONE (DELTASONE) tablet 60 mg (60 mg Oral Given 01/16/22 1446)    ED Course/ Medical Decision Making/ A&P  Medical Decision Making Amount and/or Complexity of Data Reviewed Radiology: ordered.  Risk Prescription drug management.   Patient is 26 year old female,  here for left jaw pain that is been going on for the last couple weeks, has been treated for a sinus infection and is on day 4 of her antibiotics.  Findings are significant for likely TMJ, given her combination of clicking of the jaw, pain with opening of her mouth, and ear discomfort.  She requested imaging so a CT was done, CT shows no remarkable findings except for an acute on chronic sinus infection.  She is on day 4 of her antibiotics, so I instructed her to continue them, she notes that she will be on the for at least 6 more days.  Advised on return symptoms, she voiced understanding.  Discussed conservative care for TMJ syndrome.  Likely need to see dentist.  Final Clinical Impression(s) / ED Diagnoses Final diagnoses:  TMJ (temporomandibular joint syndrome)  Acute sinusitis, recurrence not specified, unspecified location    Rx / DC Orders ED Discharge Orders          Ordered    ketorolac (TORADOL)  10 MG tablet  Every 6 hours PRN        01/16/22 1610    cyclobenzaprine (FLEXERIL) 5 MG tablet  At bedtime PRN        01/16/22 1610              Comer Devins L, PA 01/17/22 1046    Edwin Dada P, DO 01/17/22 1801

## 2022-01-16 NOTE — Discharge Instructions (Signed)
Please follow-up with your dentist as prescribed.  Use ice for discomfort, and take the Toradol as prescribed.  Do not take this medicine if you are pregnant.  Talk to your dentist about a possible mouthguard, for teeth grinding.  You can also massage the jaw to help relieve the pain.

## 2022-01-16 NOTE — ED Triage Notes (Signed)
Pain in left jaw x2 weeks. Started after she had RSV.

## 2022-04-07 ENCOUNTER — Emergency Department (HOSPITAL_COMMUNITY)
Admission: EM | Admit: 2022-04-07 | Discharge: 2022-04-07 | Disposition: A | Discharge status: Home / Self Care | Payer: BC Managed Care – PPO | Attending: Emergency Medicine | Admitting: Emergency Medicine

## 2022-04-07 ENCOUNTER — Encounter (HOSPITAL_COMMUNITY): Payer: Self-pay

## 2022-04-07 ENCOUNTER — Other Ambulatory Visit: Payer: Self-pay

## 2022-04-07 DIAGNOSIS — E119 Type 2 diabetes mellitus without complications: Secondary | ICD-10-CM

## 2022-04-07 DIAGNOSIS — R42 Dizziness and giddiness: Secondary | ICD-10-CM | POA: Insufficient documentation

## 2022-04-07 DIAGNOSIS — R079 Chest pain, unspecified: Secondary | ICD-10-CM | POA: Insufficient documentation

## 2022-04-07 DIAGNOSIS — R002 Palpitations: Secondary | ICD-10-CM | POA: Insufficient documentation

## 2022-04-07 DIAGNOSIS — Z1152 Encounter for screening for COVID-19: Secondary | ICD-10-CM | POA: Diagnosis not present

## 2022-04-07 DIAGNOSIS — R0789 Other chest pain: Secondary | ICD-10-CM | POA: Diagnosis not present

## 2022-04-07 HISTORY — DX: Type 2 diabetes mellitus without complications: E11.9

## 2022-04-07 LAB — CBC
HCT: 41 % (ref 36.0–46.0)
Hemoglobin: 13 g/dL (ref 12.0–15.0)
MCH: 28.4 pg (ref 26.0–34.0)
MCHC: 31.7 g/dL (ref 30.0–36.0)
MCV: 89.5 fL (ref 80.0–100.0)
Platelets: 322 10*3/uL (ref 150–400)
RBC: 4.58 MIL/uL (ref 3.87–5.11)
RDW: 14.3 % (ref 11.5–15.5)
WBC: 6.6 10*3/uL (ref 4.0–10.5)
nRBC: 0 % (ref 0.0–0.2)

## 2022-04-07 LAB — BASIC METABOLIC PANEL
Anion gap: 7 (ref 5–15)
BUN: 11 mg/dL (ref 6–20)
CO2: 27 mmol/L (ref 22–32)
Calcium: 8.8 mg/dL — ABNORMAL LOW (ref 8.9–10.3)
Chloride: 106 mmol/L (ref 98–111)
Creatinine, Ser: 0.74 mg/dL (ref 0.44–1.00)
GFR, Estimated: >60 mL/min (ref >60)
Glucose, Bld: 80 mg/dL (ref 70–99)
Potassium: 3.6 mmol/L (ref 3.5–5.1)
Sodium: 140 mmol/L (ref 135–145)

## 2022-04-07 LAB — RESP PANEL BY RT-PCR (RSV, FLU A&B, COVID)  RVPGX2
Influenza A by PCR: NEGATIVE
Influenza B by PCR: NEGATIVE
Resp Syncytial Virus by PCR: NEGATIVE
SARS Coronavirus 2 by RT PCR: NEGATIVE

## 2022-04-07 LAB — TROPONIN I (HIGH SENSITIVITY): Troponin I (High Sensitivity): <2 ng/L (ref <18)

## 2022-04-07 NOTE — ED Triage Notes (Signed)
Pt says was walking around her house this am around 1030 and had sudden onset of severe chest pain that radiated into both jaws.  Says the pain went away after approx . Reports started feeling light headed and very sleepy after the pain went away.  Says has been having "waves" of sob and tightness in her chest.  Says had similar episode 2 years ago and went to urgent care, and had EKG done.  Reports was told to follow up if chest pain returned.

## 2022-04-07 NOTE — ED Provider Triage Note (Signed)
Emergency Medicine Provider Triage Evaluation Note  Donna Mcknight , a 26 y.o. female  was evaluated in triage.  Pt complains of chest pain starting at 10:30 am this morning with associated SOB. CP resolved after a few minutes but continues to have intermittent waves of SOB lasting several minutes at a time. No hx of DVT/PE. No long extended travel. Says she was seen for something similar at an urgent care a few years ago and had normal EKG. Told her to receive higher level of care if it happened again.   Review of Systems  Positive: CP, SOB Negative: Leg pain, swelling, syncope  Physical Exam  BP (!) 123/98 (BP Location: Right Arm)   Pulse 72   Temp 98 F (36.7 C) (Oral)   Resp 18   Ht 5\' 5"  (1.651 m)   Wt 77.1 kg   LMP 03/31/2022   SpO2 100%   Breastfeeding No   BMI 28.29 kg/m  Gen:   Awake, no distress   Resp:  Normal effort  MSK:   Moves extremities without difficulty  Other:    Medical Decision Making  Medically screening exam initiated at 5:38 PM.  Appropriate orders placed.  Donna Mcknight was informed that the remainder of the evaluation will be completed by another provider, this initial triage assessment does not replace that evaluation, and the importance of remaining in the ED until their evaluation is complete.  Workup initiated   04/02/2022, PA-C 04/07/22 1739

## 2022-04-07 NOTE — Discharge Instructions (Signed)
Eliminate caffeine and alcohol from your diet.  These are irritants of the heart.  Smoking can also irritate the heart.  The cardiology office should contact you within the next 3 business days to set up a follow-up appointment.  If your episodes return or get worse, particularly if you are lightheaded, difficulty breathing, feel like passing out, or lose consciousness, please call 911 and come back immediately to the ER.

## 2022-04-07 NOTE — ED Provider Notes (Signed)
[facility] EMERGENCY DEPARTMENT Provider Note   CSN: [id number] Arrival date & time: 04/07/22  1304     History  Chief Complaint  Patient presents with   Chest Pain    Donna Mcknight is a 26 y.o. female presenting to ED with report of acute onset of palpitations and lightheadedness and chest pain.  Patient reports this began abruptly this morning around 1030, she felt like her heart was racing, had pain radiating up her neck, felt short of breath.  This lasted about 5 to 6 minutes and then diminished, though she remained a little short of breath and uncomfortable for a few hours, but is now completely back to baseline on my evaluation.  She had a similar episode a few years ago went to an urgent care and was told her EKG was normal and to come to the ER if it happens again.  She has not had any problems since.  She does drink 3 or more cups of caffeine or coffee a day.  She does not smoke nicotine but does smoke marijuana.  She denies any heavy alcohol binges or regular alcohol consumption.  Denies any other recreational drugs.  Denies any significant history of cardiac disease or any other significant medical problems.  Family history of A-fib in her uncle, but no other significant issues.  No personal or family history of DVT or PE.  No estrogen or hormone use.  No recent surgery.  No prolonged immobilization.   HPI     Home Medications Prior to Admission medications   Medication Sig Start Date End Date Taking? Authorizing Provider  albuterol (VENTOLIN HFA) 108 (90 Base) MCG/ACT inhaler Inhale 2 puffs into the lungs every 4 (four) hours as needed for wheezing or shortness of breath. 01/08/22   Particia Nearing, PA-C  buPROPion 450 MG TB24 Take 450 mg by mouth daily. 07/27/21   Cheral Marker, CNM  ketorolac (TORADOL) 10 MG tablet Take 1 tablet (10 mg total) by mouth every 6 (six) hours as needed. 01/16/22   Small, Brooke L, PA  Lactic Ac-Citric Ac-Pot Bitart  (PHEXXI) 1.8-1-0.4 % GEL Insert just prior to intercourse, good for one hour Patient not taking: Reported on 08/23/2021 05/24/21   Cresenzo-Dishmon, Scarlette Calico, CNM  OVER THE COUNTER MEDICATION Take 85 mg by mouth daily. hemaplex    [provider]  predniSONE (DELTASONE) 20 MG tablet Take 2 tablets (40 mg total) by mouth daily with breakfast. 01/08/22   Particia Nearing, PA-C  Prenatal Vit-Fe Fumarate-FA (PRENATAL VITAMIN PO) Take 1 tablet by mouth daily.    [provider]  promethazine-dextromethorphan (PROMETHAZINE-DM) 6.25-15 MG/5ML syrup Take 5 mLs by mouth 4 (four) times daily as needed. 01/08/22   Particia Nearing, PA-C  Pseudoeph-Doxylamine-DM-APAP (DAYQUIL/NYQUIL COLD/FLU RELIEF PO) Take by mouth.    [provider]      Allergies    Nickel and Other    Review of Systems   Review of Systems  Physical Exam Updated Vital Signs BP 129/85 (BP Location: Right Arm)   Pulse 73   Temp 98.2 F (36.8 C)   Resp 14   Ht 5\' 5"  (1.651 m)   Wt 77.1 kg   LMP 03/31/2022   SpO2 100%   Breastfeeding No   BMI 28.29 kg/m  Physical Exam Constitutional:      General: She is not in acute distress. HENT:     Head: Normocephalic and atraumatic.  Eyes:     Conjunctiva/sclera: Conjunctivae normal.  Pupils: Pupils are equal, round, and reactive to light.  Cardiovascular:     Rate and Rhythm: Normal rate and regular rhythm.  Pulmonary:     Effort: Pulmonary effort is normal. No respiratory distress.  Abdominal:     General: There is no distension.     Tenderness: There is no abdominal tenderness.  Skin:    General: Skin is warm and dry.  Neurological:     General: No focal deficit present.     Mental Status: She is alert. Mental status is at baseline.  Psychiatric:        Mood and Affect: Mood normal.        Behavior: Behavior normal.     ED Results / Procedures / Treatments   Labs (all labs ordered are listed, but only abnormal results are  displayed) Labs Reviewed  BASIC METABOLIC PANEL - Abnormal; Notable for the following components:      Result Value   Calcium 8.8 (*)    All other components within normal limits  RESP PANEL BY RT-PCR (RSV, FLU A&B, COVID)  RVPGX2  CBC  TROPONIN I (HIGH SENSITIVITY)    EKG EKG Interpretation  Date/Time:  Thursday April 07 2022 13:43:49 EST Ventricular Rate:  68 PR Interval:  132 QRS Duration: 82 QT Interval:  404 QTC Calculation: 429 R Axis:   71 Text Interpretation: Normal sinus rhythm Normal ECG No previous ECGs available Confirmed by Alvester Chou [phone]) on 04/07/2022 5:08:09 PM  Radiology No results found.  Procedures Procedures    Medications Ordered in ED Medications - No data to display  ED Course/ Medical Decision Making/ A&P                           Medical Decision Making Amount and/or Complexity of Data Reviewed Labs: ordered.   This patient presents to the ED with concern for palpitations, lightheadedness. This involves an extensive number of treatment options, and is a complaint that carries with it a high risk of complications and morbidity.  The differential diagnosis includes review most likely, which would include SVT, given the abrupt onset and offset of her symptoms.  Less likely would be pneumonia, pulmonary embolism, aortic dissection, or anxiety or panic attack.  I ordered and personally interpreted labs.  The pertinent results include: Troponin in the detectable nearly 6 hours after onset of her symptoms.  Electrolytes within normal limits.  I doubt myocarditis or significant electrolyte derangement.  The patient was maintained on a cardiac monitor.  I personally viewed and interpreted the cardiac monitored which showed an underlying rhythm of: Sinus rhythm  Per my interpretation the patient's ECG shows normal sinus rhythm no evidence of acute ischemia or emergent arrhythmia  Test Considered: I considered pulmonary embolism but the  patient is PERC negative and therefore low risk for PE.  She was having no chest pain or signs or symptoms of pneumonia or pneumothorax do not feel that emergent imaging of the chest is indicated at this time.  After the interventions noted above, I reevaluated the patient and found that they have: improved  Social: We discussed reducing her caffeine intake which could be a stimulant for her heart.  Dispostion:  After consideration of the diagnostic results and the patients response to treatment, I feel that the patent would benefit from cardiology referral as she may be having symptomatic episodes of SVT and may benefit from cardiac monitor office evaluation.  I also advised that  she monitor her heart rate at home if this were to happen again we discussed Valsalva maneuvers.  She verbalized understanding and is stable at this time for discharge.         Final Clinical Impression(s) / ED Diagnoses Final diagnoses:  Palpitations  Lightheadedness  Chest pain, unspecified type    Rx / DC Orders ED Discharge Orders          Ordered    Ambulatory referral to Cardiology       Comments: Potential arrhythmia, symptomatic   04/07/22 1952              Terald Sleeper, MD 04/07/22 [phone]

## 2022-04-11 NOTE — L&D Delivery Note (Signed)
OB/GYN Faculty Practice Delivery Note  Donna Mcknight is a 27 y.o. G2P1001 s/p SVD at [redacted]w[redacted]d. She was admitted for eIOL.   ROM: 6h 26m with clear fluid GBS Status: Negative/-- (10/28 1330) Maximum Maternal Temperature: Temp (24hrs), Avg:98.3 F (36.8 C), Min:97.9 F (36.6 C), Max:98.7 F (37.1 C)    Labor Progress: Patient arrived at 4 cm dilation and was induced with cytotec, AROM, Pit .   Delivery Date/Time: 03/08/2023 at 1907 Delivery: Called to room and patient was complete and pushing. Head delivered in ROA position. No nuchal cord present. Shoulder and body delivered in usual fashion. Infant with spontaneous cry, placed on mother's abdomen, dried and stimulated. Cord clamped x 2 after 1-minute delay, and cut by FOB. Cord blood drawn. Placenta delivered spontaneously with gentle cord traction. Fundus firm with massage and Pitocin. Labia, perineum, vagina, and cervix inspected with no lacerations.   Placenta:  spontaneous, intact, 3 vessel cord  Complications: None Lacerations: None EBL: 121 mL Analgesia: None   Infant: APGAR (1 MIN): 9  APGAR (5 MINS): 9  APGAR (10 MINS):    Weight: Pending   Derrel Nip, MD Attending Family Medicine Physician, [facility] for [facility], [facility]  03/08/2023 7:23 PM

## 2022-04-29 ENCOUNTER — Ambulatory Visit: Payer: BC Managed Care – PPO | Admitting: Internal Medicine

## 2022-05-02 ENCOUNTER — Ambulatory Visit: Payer: BC Managed Care – PPO | Attending: Internal Medicine | Admitting: Internal Medicine

## 2022-05-02 ENCOUNTER — Encounter: Payer: Self-pay | Admitting: Internal Medicine

## 2022-05-02 ENCOUNTER — Ambulatory Visit: Payer: BC Managed Care – PPO | Attending: Internal Medicine

## 2022-05-02 VITALS — BP 114/62 | HR 74 | Ht 65.5 in | Wt 179.0 lb

## 2022-05-02 DIAGNOSIS — R002 Palpitations: Secondary | ICD-10-CM

## 2022-05-02 DIAGNOSIS — R0602 Shortness of breath: Secondary | ICD-10-CM

## 2022-05-02 NOTE — Progress Notes (Signed)
Cardiology Office Note   Date:  05/02/2022   ID:  Donna Mcknight, DOB 18-Jun-1995, MRN [id number]  PCP:  Pcp, No  Cardiologist:   Dietrich Pates, MD   Patient presents for evaluation of tachycardia   History of Present Illness: Donna Mcknight is a 27 y.o. female with a history of palptations and dizziness    The pt ws seen in the [facility] ER on 04/07/22   Felt heart racing   Lasted about 5 min then stopped  . Intense   Got very dizzy    Fingers  and feet went numb    She was sitting when it occurred   went to [facility] ER  In SR at time \ Since then she has found it is harder to catch breath  She has felt more fatigued   Mild lightheadedness     Prior spell occurred about  2 years ago  Less intense  lasted 30 min  No syncope   Stopped on own  Seen in urgent care  The pt says she has had irregular heart beat for years    No dizziness with events   The pt drinks 3 cups or more of caffeine per day  Had RSV 3 months ago     Current Meds  Medication Sig   Lactic Ac-Citric Ac-Pot Bitart (PHEXXI) 1.8-1-0.4 % GEL Insert just prior to intercourse, good for one hour     Allergies:   Nickel and Other   Past Medical History:  Diagnosis Date   Anxiety    Depression    Diabetes mellitus without complication (HCC)    gestational   Mental disorder    anxiety, depression    Past Surgical History:  Procedure Laterality Date   MOUTH SURGERY       Social History:  The patient  reports that she has never smoked. She has never used smokeless tobacco. She reports current alcohol use of about 10.0 standard drinks of alcohol per week. She reports that she does not use drugs.  Caffeine  3 cups coffee per day   Family History:  The patient's family history includes Cancer in her maternal grandmother; Cirrhosis in her father; Diabetes in her maternal grandfather; Fibroids in her mother; Hypertension in her mother.    ROS:  Please see the history of present illness. All  other systems are reviewed and  Negative to the above problem except as noted.    PHYSICAL EXAM: VS:  BP 114/62   Pulse 74   Ht 5' 5.5" (1.664 m)   Wt 179 lb (81.2 kg)   LMP 03/31/2022   SpO2 98%   BMI 29.33 kg/m   GEN: Well nourished, well developed, in no acute distress  HEENT: normal  Neck: no JVD, carotid bruits Cardiac: RRR; no murmurs, rubs, or gallops,no edema  Respiratory:  clear to auscultation bilaterally,  GI: soft, nontender, nondistended, + BS  No hepatomegaly  MS: no deformity Moving all extremities   Skin: warm and dry, no rash Neuro:  Strength and sensation are intact Psych: euthymic mood, full affect   EKG:  EKG isnot  ordered today.  In ER 04/07/22  NSR    Lipid Panel No results found for: "CHOL", "TRIG", "HDL", "CHOLHDL", "VLDL", "LDLCALC", "LDLDIRECT"    Wt Readings from Last 3 Encounters:  05/02/22 179 lb (81.2 kg)  04/07/22 170 lb (77.1 kg)  01/16/22 170 lb (77.1 kg)      ASSESSMENT AND PLAN:  1  Tachycardia  Pt with 2 spells of tachycardia that sound like SVT   Second episode more intense.    Will be difficlt to catch I have asked the pt to get a [facility] device to capture in future  2  Palpitations   Isolated   She has noticed for awhile   Will set up for Zio patch     2 Dyspnea  Would set up for echo to evaluate LV /RV function give fatiguability    Current medicines are reviewed at length with the patient today.  The patient does not have concerns regarding medicines.  Signed, Dietrich Pates, MD  05/02/2022 2:02 PM    [facility] [address] [place], [place], [place]  [address] Phone: [phone]; Fax: [phone]

## 2022-05-02 NOTE — Progress Notes (Unsigned)
Enrolled for [employer] to mail a ZIO XT long term holter monitor to the patients address on file.  

## 2022-05-02 NOTE — Progress Notes (Addendum)
   Scroll to note below  Dorris Carnes, MD

## 2022-05-02 NOTE — Patient Instructions (Addendum)
Medication Instructions:   *If you need a refill on your cardiac medications before your next appointment, please call your pharmacy*   Lab Work:  If you have labs (blood work) drawn today and your tests are completely normal, you will receive your results only by: [place] (if you have MyChart) OR A paper copy in the mail If you have any lab test that is abnormal or we need to change your treatment, we will call you to review the results.   Testing/Procedures: [place]n [place]- Long Term Monitor Instructions  Your physician has requested you wear a ZIO patch monitor for 7 days.  This is a single patch monitor. [employer] supplies one patch monitor per enrollment. Additional stickers are not available. Please do not apply patch if you will be having a Nuclear Stress Test,  Echocardiogram, Cardiac CT, MRI, or Chest Xray during the period you would be wearing the  monitor. The patch cannot be worn during these tests. You cannot remove and re-apply the  ZIO XT patch monitor.  Your ZIO patch monitor will be mailed 3 day USPS to your address on file. It may take 3-5 days  to receive your monitor after you have been enrolled.  Once you have received your monitor, please review the enclosed instructions. Your monitor  has already been registered assigning a specific monitor serial # to you.  Billing and Patient Assistance Program Information  We have supplied [employer] with any of your insurance information on file for billing purposes. [employer] offers a sliding scale Patient Assistance Program for patients that do not have  insurance, or whose insurance does not completely cover the cost of the ZIO monitor.  You must apply for the Patient Assistance Program to qualify for this discounted rate.  To apply, please call [employer] at [phone], select option 4, select option 2, ask to apply for  Patient Assistance Program. Theodore Demark will ask your household income, and how many people  are in your  household. They will quote your out-of-pocket cost based on that information.  [employer] will also be able to set up a 52-month, interest-free payment plan if needed.  Applying the monitor   Shave hair from upper left chest.  Hold abrader disc by orange tab. Rub abrader in 40 strokes over the upper left chest as  indicated in your monitor instructions.  Clean area with 4 enclosed alcohol pads. Let dry.  Apply patch as indicated in monitor instructions. Patch will be placed under collarbone on left  side of chest with arrow pointing upward.  Rub patch adhesive wings for 2 minutes. Remove white label marked "1". Remove the white  label marked "2". Rub patch adhesive wings for 2 additional minutes.  While looking in a mirror, press and release button in center of patch. A small green light will  flash 3-4 times. This will be your only indicator that the monitor has been turned on.  Do not shower for the first 24 hours. You may shower after the first 24 hours.  Press the button if you feel a symptom. You will hear a small click. Record Date, Time and  Symptom in the Patient Logbook.  When you are ready to remove the patch, follow instructions on the last 2 pages of Patient  Logbook. Stick patch monitor onto the last page of Patient Logbook.  Place Patient Logbook in the blue and white box. Use locking tab on box and tape box closed  securely. The blue and white box has prepaid  postage on it. Please place it in the mailbox as  soon as possible. Your physician should have your test results approximately 7 days after the  monitor has been mailed back to [facility].  Call [place] at [phone] if you have questions regarding  your ZIO XT patch monitor. Call them immediately if you see an orange light blinking on your  monitor.  If your monitor falls off in less than 4 days, contact our Monitor department at [phone].  If your monitor becomes loose or falls off after  4 days call [employer] at [phone] for  suggestions on securing your monitor  ([facility])  Your physician has requested that you have an echocardiogram. Echocardiography is a painless test that uses sound waves to create images of your heart. It provides your doctor with information about the size and shape of your heart and how well your heart's chambers and valves are working. This procedure takes approximately one hour. There are no restrictions for this procedure. Please do NOT wear cologne, perfume, aftershave, or lotions (deodorant is allowed). Please arrive 15 minutes prior to your appointment time.    Follow-Up: At [facility], you and your health needs are our priority.  As part of our continuing mission to provide you with exceptional heart care, we have created designated Provider Care Teams.  These Care Teams include your primary Cardiologist (physician) and Advanced Practice Providers (APPs -  Physician Assistants and Nurse Practitioners) who all work together to provide you with the care you need, when you need it.  We recommend signing up for the patient portal called "MyChart".  Sign up information is provided on this After Visit Summary.  MyChart is used to connect with patients for Virtual Visits (Telemedicine).  Patients are able to view lab/test results, encounter notes, upcoming appointments, etc.  Non-urgent messages can be sent to your provider as well.   To learn more about what you can do with MyChart, go to [email or web address].

## 2022-05-06 DIAGNOSIS — R0602 Shortness of breath: Secondary | ICD-10-CM

## 2022-05-06 DIAGNOSIS — R002 Palpitations: Secondary | ICD-10-CM

## 2022-05-19 ENCOUNTER — Ambulatory Visit (HOSPITAL_COMMUNITY)
Admission: RE | Admit: 2022-05-19 | Discharge: 2022-05-19 | Disposition: A | Discharge status: Home / Self Care | Payer: BC Managed Care – PPO | Source: Ambulatory Visit | Attending: Internal Medicine | Admitting: Internal Medicine

## 2022-05-19 DIAGNOSIS — R0602 Shortness of breath: Secondary | ICD-10-CM | POA: Diagnosis not present

## 2022-05-19 DIAGNOSIS — R002 Palpitations: Secondary | ICD-10-CM

## 2022-05-19 LAB — ECHOCARDIOGRAM COMPLETE
AR max vel: 2.31 cm2
AV Area VTI: 2.54 cm2
AV Area mean vel: 2 cm2
AV Mean grad: 3.3 mmHg
AV Peak grad: 7 mmHg
Ao pk vel: 1.32 m/s
Area-P 1/2: 3.42 cm2
S' Lateral: 3.5 cm

## 2022-05-19 NOTE — Progress Notes (Signed)
*  PRELIMINARY RESULTS* Echocardiogram 2D Echocardiogram has been performed.  Donna Mcknight 05/19/2022, 12:21 PM

## 2022-06-09 ENCOUNTER — Encounter: Payer: Self-pay | Admitting: Internal Medicine

## 2022-06-30 ENCOUNTER — Ambulatory Visit (INDEPENDENT_AMBULATORY_CARE_PROVIDER_SITE_OTHER): Payer: BC Managed Care – PPO | Admitting: Adult Health

## 2022-06-30 ENCOUNTER — Encounter: Payer: Self-pay | Admitting: Adult Health

## 2022-06-30 VITALS — BP 118/74 | HR 72 | Ht 65.5 in | Wt 173.5 lb

## 2022-06-30 DIAGNOSIS — K5900 Constipation, unspecified: Secondary | ICD-10-CM

## 2022-06-30 DIAGNOSIS — F419 Anxiety disorder, unspecified: Secondary | ICD-10-CM | POA: Diagnosis not present

## 2022-06-30 DIAGNOSIS — N920 Excessive and frequent menstruation with regular cycle: Secondary | ICD-10-CM | POA: Insufficient documentation

## 2022-06-30 DIAGNOSIS — Z3A01 Less than 8 weeks gestation of pregnancy: Secondary | ICD-10-CM | POA: Diagnosis not present

## 2022-06-30 DIAGNOSIS — F32A Depression, unspecified: Secondary | ICD-10-CM | POA: Insufficient documentation

## 2022-06-30 DIAGNOSIS — Z3201 Encounter for pregnancy test, result positive: Secondary | ICD-10-CM

## 2022-06-30 DIAGNOSIS — R1032 Left lower quadrant pain: Secondary | ICD-10-CM | POA: Diagnosis not present

## 2022-06-30 DIAGNOSIS — Z01419 Encounter for gynecological examination (general) (routine) without abnormal findings: Secondary | ICD-10-CM | POA: Diagnosis not present

## 2022-06-30 DIAGNOSIS — O3680X0 Pregnancy with inconclusive fetal viability, not applicable or unspecified: Secondary | ICD-10-CM

## 2022-06-30 LAB — POCT URINE PREGNANCY: Preg Test, Ur: POSITIVE — AB

## 2022-06-30 MED ORDER — ESCITALOPRAM OXALATE 10 MG PO TABS: 10.0000 mg | ORAL_TABLET | Freq: Every day | ORAL | 3 refills | Status: DC

## 2022-06-30 NOTE — Progress Notes (Signed)
Patient ID: Donna Mcknight, female   DOB: Feb 04, 1996, 27 y.o.   MRN: [id number] History of Present Illness: Donna Mcknight is a 27 year old white female, married,G2P1001 in for a well woman gyn exam and has  numerous complaints. Has irregular periods and brown spotting, constipated, pain with sex, shortness of breath(has seen cardiology and had ECHO). And yesterday had 3+HPTs, has sharp pain LLQ at times and some mild nausea.  Last pap was negative HPV,NILM 08/17/20.   Current Medications, Allergies, Past Medical History, Past Surgical History, Family History and Social History were reviewed in [employer] record.     Review of Systems: Patient denies any headaches, hearing loss, fatigue, blurred vision,  chest pain, abdominal pain, problems with bowel movements, urination. No joint pain or mood swings.  See HPI for positives.   Physical Exam:BP 118/74 (BP Location: Left Arm, Patient Position: Sitting, Cuff Size: Normal)   Pulse 72   Ht 5' 5.5" (1.664 m)   Wt 173 lb 8 oz (78.7 kg)   LMP 05/26/2022 (Approximate)   Breastfeeding No   BMI 28.43 kg/m  UPT is +, about 5 weeks by LMP, with EDD 11/21/224. General:  Well developed, well nourished, no acute distress Skin:  Warm and dry Neck:  Midline trachea, normal thyroid, good ROM, no lymphadenopathy Lungs; Clear to auscultation bilaterally Breast:  No dominant palpable mass, retraction, or nipple discharge Cardiovascular: Regular rate and rhythm Abdomen:  Soft, non tender, no hepatosplenomegaly Pelvic:  External genitalia is normal in appearance, no lesions.  The vagina is normal in appearance. Urethra has no lesions or masses. The cervix is bulbous.  Uterus is felt to be normal size, shape, and contour.  No adnexal masses or tenderness noted.Bladder is non tender, no masses felt. Extremities/musculoskeletal:  No swelling or varicosities noted, no clubbing or cyanosis Psych:  No mood changes, alert and  cooperative,seems happy AA is 5 Fall risk is low    06/30/2022    8:46 AM 08/23/2021    8:38 AM 06/21/2021    4:28 PM  Depression screen PHQ 2/9  Decreased Interest 1 0 1  Down, Depressed, Hopeless 1 0 1  PHQ - 2 Score 2 0 2  Altered sleeping 0 0 1  Tired, decreased energy 3 1 3   Change in appetite 2 0 0  Feeling bad or failure about yourself  1 0 1  Trouble concentrating 1 0 1  Moving slowly or fidgety/restless 0 0 0  Suicidal thoughts 1 0 0  PHQ-9 Score 10 1 8        06/30/2022    8:46 AM 08/23/2021    8:54 AM 06/21/2021    4:30 PM 01/08/2021    9:07 AM  GAD 7 : Generalized Anxiety Score  Nervous, Anxious, on Edge 1 1 1 1   Control/stop worrying 2 1 1  0  Worry too much - different things 1 0 1 0  Trouble relaxing 1 1 0 0  Restless 0 0 0 0  Easily annoyed or irritable 1 1 1 2   Afraid - awful might happen 0 1 0 0  Total GAD 7 Score 6 5 4 3         Upstream - 06/29/22 1438       Contraception Wrap Up   End Method Pregnant/Seeking Pregnancy    Contraception Counseling Provided No    How was the end contraceptive method provided? N/A            Examination chaperoned by Marylu Lund  Young LPN   Impression and Plan: 1. Pregnancy examination or test, positive result Teary now  - POCT urine pregnancy - Beta hCG quant (ref lab)  2. Encounter for well woman exam with routine gynecological exam Physical and pap in 1 year  3. Anxiety and depression Will rx lexapro  Meds ordered this encounter  Medications   escitalopram (LEXAPRO) 10 MG tablet    Sig: Take 1 tablet (10 mg total) by mouth daily.    Dispense:  30 tablet    Refill:  3    Order Specific Question:   Supervising Provider    Answer:   Despina Hidden, LUTHER H [[id number]]     4. Less than [redacted] weeks gestation of pregnancy Take OTC PNV Will check QHCG since has had irregular periods  - Beta hCG quant (ref lab)  5. Constipation, unspecified constipation type Increase fruits, water and try prune juice Can use colace or  senokot if needed   6. Encounter to determine fetal viability of pregnancy, single or unspecified fetus Return in 2 weeks for dating [place]  - US OB Comp Less 14 Wks; Future  7. LLQ pain Will check labs  - CBC - Comprehensive metabolic panel  8. Spotting Will check labs No sex til wipes no color  - CBC - Beta hCG quant (ref lab)

## 2022-07-01 LAB — COMPREHENSIVE METABOLIC PANEL
ALT: 11 IU/L (ref 0–32)
AST: 14 IU/L (ref 0–40)
Albumin/Globulin Ratio: 1.7 (ref 1.2–2.2)
Albumin: 4.6 g/dL (ref 4.0–5.0)
Alkaline Phosphatase: 92 IU/L (ref 44–121)
BUN/Creatinine Ratio: 14 (ref 9–23)
BUN: 12 mg/dL (ref 6–20)
Bilirubin Total: 0.5 mg/dL (ref 0.0–1.2)
CO2: 22 mmol/L (ref 20–29)
Calcium: 9.8 mg/dL (ref 8.7–10.2)
Chloride: 100 mmol/L (ref 96–106)
Creatinine, Ser: 0.87 mg/dL (ref 0.57–1.00)
Globulin, Total: 2.7 g/dL (ref 1.5–4.5)
Glucose: 90 mg/dL (ref 70–99)
Potassium: 4.2 mmol/L (ref 3.5–5.2)
Sodium: 137 mmol/L (ref 134–144)
Total Protein: 7.3 g/dL (ref 6.0–8.5)
eGFR: 94 mL/min/{1.73_m2} (ref >59)

## 2022-07-01 LAB — CBC
Hematocrit: 37.7 % (ref 34.0–46.6)
Hemoglobin: 12.5 g/dL (ref 11.1–15.9)
MCH: 28.9 pg (ref 26.6–33.0)
MCHC: 33.2 g/dL (ref 31.5–35.7)
MCV: 87 fL (ref 79–97)
Platelets: 343 10*3/uL (ref 150–450)
RBC: 4.33 x10E6/uL (ref 3.77–5.28)
RDW: 12.6 % (ref 11.7–15.4)
WBC: 7.9 10*3/uL (ref 3.4–10.8)

## 2022-07-01 LAB — BETA HCG QUANT (REF LAB): hCG Quant: 458 m[IU]/mL

## 2022-07-14 ENCOUNTER — Encounter: Payer: Self-pay | Admitting: Advanced Practice Midwife

## 2022-07-14 ENCOUNTER — Ambulatory Visit (INDEPENDENT_AMBULATORY_CARE_PROVIDER_SITE_OTHER): Payer: BC Managed Care – PPO

## 2022-07-14 DIAGNOSIS — Z3A01 Less than 8 weeks gestation of pregnancy: Secondary | ICD-10-CM

## 2022-07-14 DIAGNOSIS — Z3481 Encounter for supervision of other normal pregnancy, first trimester: Secondary | ICD-10-CM | POA: Diagnosis not present

## 2022-07-14 DIAGNOSIS — O3680X0 Pregnancy with inconclusive fetal viability, not applicable or unspecified: Secondary | ICD-10-CM

## 2022-07-14 NOTE — Progress Notes (Signed)
[place] 7 wks,single IUP with yolk sac,FHR 120 bpm,normal ovaries,CRL 7.7 mm

## 2022-07-22 ENCOUNTER — Ambulatory Visit: Payer: BC Managed Care – PPO | Admitting: Obstetrics & Gynecology

## 2022-08-04 ENCOUNTER — Other Ambulatory Visit: Payer: BC Managed Care – PPO

## 2022-08-04 DIAGNOSIS — Z3490 Encounter for supervision of normal pregnancy, unspecified, unspecified trimester: Secondary | ICD-10-CM | POA: Insufficient documentation

## 2022-08-04 DIAGNOSIS — Z1379 Encounter for other screening for genetic and chromosomal anomalies: Secondary | ICD-10-CM

## 2022-08-04 DIAGNOSIS — Z3480 Encounter for supervision of other normal pregnancy, unspecified trimester: Secondary | ICD-10-CM

## 2022-08-04 DIAGNOSIS — Z3A10 10 weeks gestation of pregnancy: Secondary | ICD-10-CM

## 2022-08-04 DIAGNOSIS — O09893 Supervision of other high risk pregnancies, third trimester: Secondary | ICD-10-CM | POA: Insufficient documentation

## 2022-08-04 NOTE — Addendum Note (Signed)
Addended by: Moss Mc on: 08/04/2022 08:34 AM   Modules accepted: Orders

## 2022-08-09 LAB — CBC/D/PLT+RPR+RH+ABO+RUBIGG...
Antibody Screen: NEGATIVE
Basophils Absolute: 0 10*3/uL (ref 0.0–0.2)
Basos: 0 %
EOS (ABSOLUTE): 0.1 10*3/uL (ref 0.0–0.4)
Eos: 1 %
HCV Ab: NONREACTIVE
HIV Screen 4th Generation wRfx: NONREACTIVE
Hematocrit: 37.4 % (ref 34.0–46.6)
Hemoglobin: 12.3 g/dL (ref 11.1–15.9)
Hepatitis B Surface Ag: NEGATIVE
Immature Grans (Abs): 0 10*3/uL (ref 0.0–0.1)
Immature Granulocytes: 0 %
Lymphocytes Absolute: 1.5 10*3/uL (ref 0.7–3.1)
Lymphs: 18 %
MCH: 28.8 pg (ref 26.6–33.0)
MCHC: 32.9 g/dL (ref 31.5–35.7)
MCV: 88 fL (ref 79–97)
Monocytes Absolute: 0.6 10*3/uL (ref 0.1–0.9)
Monocytes: 7 %
Neutrophils Absolute: 6.4 10*3/uL (ref 1.4–7.0)
Neutrophils: 74 %
Platelets: 291 10*3/uL (ref 150–450)
RBC: 4.27 x10E6/uL (ref 3.77–5.28)
RDW: 13.6 % (ref 11.7–15.4)
RPR Ser Ql: NONREACTIVE
Rh Factor: POSITIVE
Rubella Antibodies, IGG: 1.45 index (ref >0.99)
WBC: 8.5 10*3/uL (ref 3.4–10.8)

## 2022-08-09 LAB — MATERNIT 21 PLUS CORE, BLOOD
Fetal Fraction: 8
Result (T21): NEGATIVE
Trisomy 13 (Patau syndrome): NEGATIVE
Trisomy 18 (Edwards syndrome): NEGATIVE
Trisomy 21 (Down syndrome): NEGATIVE

## 2022-08-09 LAB — HCV INTERPRETATION

## 2022-08-17 ENCOUNTER — Encounter: Payer: Self-pay | Admitting: Women's Health

## 2022-08-24 ENCOUNTER — Other Ambulatory Visit: Payer: Self-pay | Admitting: Obstetrics & Gynecology

## 2022-08-24 DIAGNOSIS — Z3682 Encounter for antenatal screening for nuchal translucency: Secondary | ICD-10-CM

## 2022-08-25 ENCOUNTER — Encounter: Payer: BC Managed Care – PPO | Admitting: *Deleted

## 2022-08-25 ENCOUNTER — Ambulatory Visit (INDEPENDENT_AMBULATORY_CARE_PROVIDER_SITE_OTHER): Payer: BC Managed Care – PPO | Admitting: Advanced Practice Midwife

## 2022-08-25 ENCOUNTER — Ambulatory Visit (INDEPENDENT_AMBULATORY_CARE_PROVIDER_SITE_OTHER): Payer: BC Managed Care – PPO

## 2022-08-25 ENCOUNTER — Encounter: Payer: Self-pay | Admitting: Advanced Practice Midwife

## 2022-08-25 VITALS — BP 102/65 | HR 70 | Wt 168.0 lb

## 2022-08-25 DIAGNOSIS — Z8632 Personal history of gestational diabetes: Secondary | ICD-10-CM | POA: Diagnosis not present

## 2022-08-25 DIAGNOSIS — O09299 Supervision of pregnancy with other poor reproductive or obstetric history, unspecified trimester: Secondary | ICD-10-CM | POA: Insufficient documentation

## 2022-08-25 DIAGNOSIS — Z3682 Encounter for antenatal screening for nuchal translucency: Secondary | ICD-10-CM | POA: Diagnosis not present

## 2022-08-25 DIAGNOSIS — O09291 Supervision of pregnancy with other poor reproductive or obstetric history, first trimester: Secondary | ICD-10-CM | POA: Diagnosis not present

## 2022-08-25 DIAGNOSIS — Z3480 Encounter for supervision of other normal pregnancy, unspecified trimester: Secondary | ICD-10-CM

## 2022-08-25 DIAGNOSIS — Z3A13 13 weeks gestation of pregnancy: Secondary | ICD-10-CM

## 2022-08-25 DIAGNOSIS — Z131 Encounter for screening for diabetes mellitus: Secondary | ICD-10-CM

## 2022-08-25 NOTE — Patient Instructions (Addendum)
Safe Medications in Pregnancy   Acne: Benzoyl Peroxide Salicylic Acid  Backache/Headache: Tylenol: 2 regular strength every 4 hours OR              2 Extra strength every 6 hours  Colds/Coughs/Allergies: Benadryl (alcohol free) 25 mg every 6 hours as needed Breath right strips Claritin Cepacol throat lozenges Chloraseptic throat spray Cold-Eeze- up to three times per day Cough drops, alcohol free Flonase (by prescription only) Guaifenesin Mucinex Robitussin DM (plain only, alcohol free) Saline nasal spray/drops Sudafed (pseudoephedrine) & Actifed ** use only after [redacted] weeks gestation and if you do not have high blood pressure Tylenol Vicks Vaporub Zinc lozenges Zyrtec   Constipation: Colace Ducolax suppositories Fleet enema Glycerin suppositories Metamucil Milk of magnesia Miralax Senokot Smooth move tea  Diarrhea: Kaopectate Imodium A-D  *NO pepto Bismol  Hemorrhoids: Anusol Anusol HC Preparation H Tucks  Indigestion: Tums Maalox Mylanta Zantac  Pepcid  Insomnia: Benadryl (alcohol free) 25mg  every 6 hours as needed Tylenol PM Unisom, no Gelcaps  Leg Cramps: Tums MagGel  Nausea/Vomiting:  Bonine Dramamine Emetrol Ginger extract Sea bands Meclizine  Nausea medication to take during pregnancy:  Unisom (doxylamine succinate 25 mg tablets) Take one tablet daily at bedtime. If symptoms are not adequately controlled, the dose can be increased to a maximum recommended dose of two tablets daily (1/2 tablet in the morning, 1/2 tablet mid-afternoon and one at bedtime). Vitamin B6 100mg  tablets. Take one tablet twice a day (up to 200 mg per day).  Skin Rashes: Aveeno products Benadryl cream or 25mg  every 6 hours as needed Calamine Lotion 1% cortisone cream  Yeast infection: Gyne-lotrimin 7 Monistat 7   **If taking multiple medications, please check labels to avoid duplicating the same active ingredients **take medication as directed on  the label ** Do not exceed 4000 mg of tylenol in 24 hours **Do not take medications that contain aspirin or ibuprofen  Donna Mcknight, I greatly value your feedback.  If you receive a survey following your visit with [place] today, we appreciate you taking the time to fill it out.  Thanks, Cathie Beams, CNM     [facility] HAS MOVED!!! It is now [facility] ([address] [place], [place] [address]) Entrance located off of [address] Free 24/7 valet parking   Go to [email or web address] to register for FREE online childbirth classes    Second Trimester of Pregnancy The second trimester is from week 14 through week 27 (months 4 through 6). The second trimester is often a time when you feel your best. Your body has adjusted to being pregnant, and you begin to feel better physically. Usually, morning sickness has lessened or quit completely, you may have more energy, and you may have an increase in appetite. The second trimester is also a time when the fetus is growing rapidly. At the end of the sixth month, the fetus is about 9 inches long and weighs about 1 pounds. You will likely begin to feel the baby move (quickening) between 16 and 20 weeks of pregnancy. Body changes during your second trimester Your body continues to go through many changes during your second trimester. The changes vary from woman to woman. Your weight will continue to increase. You will notice your lower abdomen bulging out. You may begin to get stretch marks on your hips, abdomen, and breasts. You may develop headaches that can be relieved by medicines. The medicines should be approved by your health care  provider. You may urinate more often because the fetus is pressing on your bladder. You may develop or continue to have heartburn as a result of your pregnancy. You may develop constipation because certain hormones are causing the muscles that push waste through  your intestines to slow down. You may develop hemorrhoids or swollen, bulging veins (varicose veins). You may have back pain. This is caused by: Weight gain. Pregnancy hormones that are relaxing the joints in your pelvis. A shift in weight and the muscles that support your balance. Your breasts will continue to grow and they will continue to become tender. Your gums may bleed and may be sensitive to brushing and flossing. Dark spots or blotches (chloasma, mask of pregnancy) may develop on your face. This will likely fade after the baby is born. A dark line from your belly button to the pubic area (linea nigra) may appear. This will likely fade after the baby is born. You may have changes in your hair. These can include thickening of your hair, rapid growth, and changes in texture. Some women also have hair loss during or after pregnancy, or hair that feels dry or thin. Your hair will most likely return to normal after your baby is born.  What to expect at prenatal visits During a routine prenatal visit: You will be weighed to make sure you and the fetus are growing normally. Your blood pressure will be taken. Your abdomen will be measured to track your baby's growth. The fetal heartbeat will be listened to. Any test results from the previous visit will be discussed.  Your health care provider may ask you: How you are feeling. If you are feeling the baby move. If you have had any abnormal symptoms, such as leaking fluid, bleeding, severe headaches, or abdominal cramping. If you are using any tobacco products, including cigarettes, chewing tobacco, and electronic cigarettes. If you have any questions.  Other tests that may be performed during your second trimester include: Blood tests that check for: Low iron levels (anemia). High blood sugar that affects pregnant women (gestational diabetes) between 52 and 28 weeks. Rh antibodies. This is to check for a protein on red blood cells (Rh  factor). Urine tests to check for infections, diabetes, or protein in the urine. An ultrasound to confirm the proper growth and development of the baby. An amniocentesis to check for possible genetic problems. Fetal screens for spina bifida and Down syndrome. HIV (human immunodeficiency virus) testing. Routine prenatal testing includes screening for HIV, unless you choose not to have this test.  Follow these instructions at home: Medicines Follow your health care provider's instructions regarding medicine use. Specific medicines may be either safe or unsafe to take during pregnancy. Take a prenatal vitamin that contains at least 600 micrograms (mcg) of folic acid. If you develop constipation, try taking a stool softener if your health care provider approves. Eating and drinking Eat a balanced diet that includes fresh fruits and vegetables, whole grains, good sources of protein such as meat, eggs, or tofu, and low-fat dairy. Your health care provider will help you determine the amount of weight gain that is right for you. Avoid raw meat and uncooked cheese. These carry germs that can cause birth defects in the baby. If you have low calcium intake from food, talk to your health care provider about whether you should take a daily calcium supplement. Limit foods that are high in fat and processed sugars, such as fried and sweet foods. To prevent constipation: Drink  enough fluid to keep your urine clear or pale yellow. Eat foods that are high in fiber, such as fresh fruits and vegetables, whole grains, and beans. Activity Exercise only as directed by your health care provider. Most women can continue their usual exercise routine during pregnancy. Try to exercise for 30 minutes at least 5 days a week. Stop exercising if you experience uterine contractions. Avoid heavy lifting, wear low heel shoes, and practice good posture. A sexual relationship may be continued unless your health care provider  directs you otherwise. Relieving pain and discomfort Wear a good support bra to prevent discomfort from breast tenderness. Take warm sitz baths to soothe any pain or discomfort caused by hemorrhoids. Use hemorrhoid cream if your health care provider approves. Rest with your legs elevated if you have leg cramps or low back pain. If you develop varicose veins, wear support hose. Elevate your feet for 15 minutes, 3-4 times a day. Limit salt in your diet. Prenatal Care Write down your questions. Take them to your prenatal visits. Keep all your prenatal visits as told by your health care provider. This is important. Safety Wear your seat belt at all times when driving. Make a list of emergency phone numbers, including numbers for family, friends, the hospital, and police and fire departments. General instructions Ask your health care provider for a referral to a local prenatal education class. Begin classes no later than the beginning of month 6 of your pregnancy. Ask for help if you have counseling or nutritional needs during pregnancy. Your health care provider can offer advice or refer you to specialists for help with various needs. Do not use hot tubs, steam rooms, or saunas. Do not douche or use tampons or scented sanitary pads. Do not cross your legs for long periods of time. Avoid cat litter boxes and soil used by cats. These carry germs that can cause birth defects in the baby and possibly loss of the fetus by miscarriage or stillbirth. Avoid all smoking, herbs, alcohol, and unprescribed drugs. Chemicals in these products can affect the formation and growth of the baby. Do not use any products that contain nicotine or tobacco, such as cigarettes and e-cigarettes. If you need help quitting, ask your health care provider. Visit your dentist if you have not gone yet during your pregnancy. Use a soft toothbrush to brush your teeth and be gentle when you floss. Contact a health care provider  if: You have dizziness. You have mild pelvic cramps, pelvic pressure, or nagging pain in the abdominal area. You have persistent nausea, vomiting, or diarrhea. You have a bad smelling vaginal discharge. You have pain when you urinate. Get help right away if: You have a fever. You are leaking fluid from your vagina. You have spotting or bleeding from your vagina. You have severe abdominal cramping or pain. You have rapid weight gain or weight loss. You have shortness of breath with chest pain. You notice sudden or extreme swelling of your face, hands, ankles, feet, or legs. You have not felt your baby move in over an hour. You have severe headaches that do not go away when you take medicine. You have vision changes. Summary The second trimester is from week 14 through week 27 (months 4 through 6). It is also a time when the fetus is growing rapidly. Your body goes through many changes during pregnancy. The changes vary from woman to woman. Avoid all smoking, herbs, alcohol, and unprescribed drugs. These chemicals affect the formation and growth  your baby. Do not use any tobacco products, such as cigarettes, chewing tobacco, and e-cigarettes. If you need help quitting, ask your health care provider. Contact your health care provider if you have any questions. Keep all prenatal visits as told by your health care provider. This is important. This information is not intended to replace advice given to you by your health care provider. Make sure you discuss any questions you have with your health care provider.

## 2022-08-25 NOTE — Progress Notes (Signed)
[place] 13 wks,measurements c/w dates,FHR 157 bpm,normal ovaries,posterior placenta,NT 1.3 mm,NB present

## 2022-08-25 NOTE — Progress Notes (Signed)
INITIAL OBSTETRICAL VISIT Patient name: Donna Mcknight MRN [id number]  Date of birth: 1996/02/26 Chief Complaint:   Initial Prenatal Visit  History of Present Illness:   Donna Mcknight is a 27 y.o. G71P1001  female at [redacted]w[redacted]d by LMP c/w u/s at 6 weeks with an Estimated Date of Delivery: 03/02/23 being seen today for her initial obstetrical visit.   Her obstetrical history is significant for A1DM and PPH (IV iron).  Taking Lexapro   Today she reports cramping is better, declines need for nausea meds.     08/25/2022    9:46 AM 06/30/2022    8:46 AM 08/23/2021    8:38 AM 06/21/2021    4:28 PM 01/08/2021    9:07 AM  Depression screen PHQ 2/9  Decreased Interest 0 1 0 1 0  Down, Depressed, Hopeless 0 1 0 1 0  PHQ - 2 Score 0 2 0 2 0  Altered sleeping 3 0 0 1 0  Tired, decreased energy 1 3 1 3 2   Change in appetite 3 2 0 0 1  Feeling bad or failure about yourself  0 1 0 1 0  Trouble concentrating 0 1 0 1 0  Moving slowly or fidgety/restless 0 0 0 0 0  Suicidal thoughts 0 1 0 0 0  PHQ-9 Score 7 10 1 8 3     Patient's last menstrual period was 05/26/2022 (approximate). Last pap  Diagnosis  Date Value Ref Range Status  08/17/2020   Final   - Negative for intraepithelial lesion or malignancy (NILM)   Review of Systems:   Pertinent items are noted in HPI Denies cramping/contractions, leakage of fluid, vaginal bleeding, abnormal vaginal discharge w/ itching/odor/irritation, headaches, visual changes, shortness of breath, chest pain, abdominal pain, severe nausea/vomiting, or problems with urination or bowel movements unless otherwise stated above.  Pertinent History Reviewed:  Reviewed past medical,surgical, social, obstetrical and family history.  Reviewed problem list, medications and allergies. OB History  Gravida Para Term Preterm AB Living  2 1 1  0 0 1  SAB IAB Ectopic Multiple Live Births  0 0 0 0 1    # Outcome Date GA Lbr Len/2nd Weight Sex Delivery Anes PTL  Lv  2 Current           1 Term 04/18/21 [redacted]w[redacted]d 06:03 / 00:42 7 lb 10 oz (3.459 kg) F Vag-Spont Local N LIV     Birth Comments: none     Complications: Gestational diabetes   Physical Assessment:   Vitals:   08/25/22 0938  BP: 102/65  Pulse: 70  Weight: 168 lb (76.2 kg)  Body mass index is 27.53 kg/m.       Physical Examination:  General appearance - well appearing, and in no distress  Mental status - alert, oriented to person, place, and time  Psych:  She has a normal mood and affect  Skin - warm and dry, normal color, no suspicious lesions noted  Chest - effort normal  Heart - normal rate and regular rhythm  Abdomen - soft, nontender  Extremities:  No swelling or varicosities noted   TODAY'S NT [place] 13 wks,measurements c/w dates,FHR 157 bpm,normal ovaries,posterior placenta,NT 1.3 mm,NB present   No results found for this or any previous visit (from the past 24 hour(s)).      08/25/2022    9:46 AM 06/30/2022    8:46 AM 08/23/2021    8:38 AM 06/21/2021    4:28 PM 01/08/2021    9:07 AM  Depression screen PHQ 2/9  Decreased Interest 0 1 0 1 0  Down, Depressed, Hopeless 0 1 0 1 0  PHQ - 2 Score 0 2 0 2 0  Altered sleeping 3 0 0 1 0  Tired, decreased energy 1 3 1 3 2   Change in appetite 3 2 0 0 1  Feeling bad or failure about yourself  0 1 0 1 0  Trouble concentrating 0 1 0 1 0  Moving slowly or fidgety/restless 0 0 0 0 0  Suicidal thoughts 0 1 0 0 0  PHQ-9 Score 7 10 1 8 3         08/25/2022    9:46 AM 06/30/2022    8:46 AM 08/23/2021    8:54 AM 06/21/2021    4:30 PM  GAD 7 : Generalized Anxiety Score  Nervous, Anxious, on Edge 2 1 1 1   Control/stop worrying 1 2 1 1   Worry too much - different things 1 1 0 1  Trouble relaxing 1 1 1  0  Restless 0 0 0 0  Easily annoyed or irritable 2 1 1 1   Afraid - awful might happen 2 0 1 0  Total GAD 7 Score 9 6 5 4       Assessment & Plan:  1) Low-Risk Pregnancy G2P1001 at [redacted]w[redacted]d with an Estimated Date of Delivery: 03/02/23    2) Initial OB visit    1. History of gestational diabetes in prior pregnancy, currently pregnant Diet controlled - Hemoglobin A1c  2. Supervision of other normal pregnancy, antepartum  - Integrated 1 - Urine Culture - CH[facility] AMB BABYSCRIPTS SCHEDULE OPTIMIZATION - GC/Chlamydia Probe Amp - Hemoglobin A1c  3. [redacted] weeks gestation of pregnancy  4. Diabetes mellitus screening  - Hemoglobin A1c    5: Hx PPH  Active mgt 3rd stage, TXA   Meds: No orders of the defined types were placed in this encounter.   Initial labs obtained Continue prenatal vitamins Reviewed n/v relief measures and warning s/s to report Reviewed recommended weight gain based on pre-gravid BMI Encouraged well-balanced diet Genetic & carrier screening discussed:  NIPS complete, low risk girl;  NT/IT [place] normal Ultrasound discussed; fetal survey: requested CCNC completed> form faxed if has or is planning to apply for medicaid The nature of [facility] [employer] with multiple MDs and other Advanced Practice Providers was explained to patient; also emphasized that fellows, residents, and students are part of our team. Has home bp cuff. Check bp weekly, let us know if >140/90.        Scarlette Calico Cresenzo-Dishmon 10:07 AM

## 2022-08-26 LAB — HEMOGLOBIN A1C: Est. average glucose Bld gHb Est-mCnc: 105 mg/dL

## 2022-08-27 LAB — INTEGRATED 1
Crown Rump Length: 65.7 mm
Gest. Age on Collection Date: 12.7 weeks
Maternal Age at EDD: 28.4 yr
Nuchal Translucency (NT): 1.3 mm
Number of Fetuses: 1
PAPP-A Value: 765 ng/mL
Weight: 168 [lb_av]

## 2022-08-27 LAB — HEMOGLOBIN A1C: Hgb A1c MFr Bld: 5.3 % (ref 4.8–5.6)

## 2022-08-29 LAB — GC/CHLAMYDIA PROBE AMP
Chlamydia trachomatis, NAA: NEGATIVE
Neisseria Gonorrhoeae by PCR: NEGATIVE

## 2022-08-29 LAB — URINE CULTURE

## 2022-09-05 ENCOUNTER — Encounter: Payer: Self-pay | Admitting: Women's Health

## 2022-09-09 ENCOUNTER — Encounter: Payer: Self-pay | Admitting: Women's Health

## 2022-09-15 ENCOUNTER — Ambulatory Visit (INDEPENDENT_AMBULATORY_CARE_PROVIDER_SITE_OTHER): Payer: BC Managed Care – PPO | Admitting: Obstetrics & Gynecology

## 2022-09-15 ENCOUNTER — Encounter: Payer: Self-pay | Admitting: Obstetrics & Gynecology

## 2022-09-15 VITALS — BP 104/71 | HR 67 | Wt 168.0 lb

## 2022-09-15 DIAGNOSIS — Z1379 Encounter for other screening for genetic and chromosomal anomalies: Secondary | ICD-10-CM | POA: Diagnosis not present

## 2022-09-15 DIAGNOSIS — Z3A16 16 weeks gestation of pregnancy: Secondary | ICD-10-CM

## 2022-09-15 DIAGNOSIS — Z3482 Encounter for supervision of other normal pregnancy, second trimester: Secondary | ICD-10-CM

## 2022-09-15 NOTE — Progress Notes (Signed)
LOW-RISK PREGNANCY VISIT Patient name: Donna Mcknight MRN [id number]  Date of birth: [birth year] Chief Complaint:   Routine Prenatal Visit  History of Present Illness:   Donna Mcknight is a 27 y.o. G39P1001 female at [redacted]w[redacted]d with an Estimated Date of Delivery: 03/02/23 being seen today for ongoing management of a low-risk pregnancy.      08/25/2022    9:46 AM 06/30/2022    8:46 AM 08/23/2021    8:38 AM 06/21/2021    4:28 PM 01/08/2021    9:07 AM  Depression screen PHQ 2/9  Decreased Interest 0 1 0 1 0  Down, Depressed, Hopeless 0 1 0 1 0  PHQ - 2 Score 0 2 0 2 0  Altered sleeping 3 0 0 1 0  Tired, decreased energy 1 3 1 3 2   Change in appetite 3 2 0 0 1  Feeling bad or failure about yourself  0 1 0 1 0  Trouble concentrating 0 1 0 1 0  Moving slowly or fidgety/restless 0 0 0 0 0  Suicidal thoughts 0 1 0 0 0  PHQ-9 Score 7 10 1 8 3     Today she reports backache and fatigue. Contractions: Not present. Vag. Bleeding: None.  Movement: Absent. denies leaking of fluid. Review of Systems:   Pertinent items are noted in HPI Denies abnormal vaginal discharge w/ itching/odor/irritation, headaches, visual changes, shortness of breath, chest pain, abdominal pain, severe nausea/vomiting, or problems with urination or bowel movements unless otherwise stated above. Pertinent History Reviewed:  Reviewed past medical,surgical, social, obstetrical and family history.  Reviewed problem list, medications and allergies.  Physical Assessment:   Vitals:   09/15/22 0850  BP: 104/71  Pulse: 67  Weight: 168 lb (76.2 kg)  Body mass index is 27.53 kg/m.        Physical Examination:   General appearance: Well appearing, and in no distress  Mental status: Alert, oriented to person, place, and time  Skin: Warm & dry  Respiratory: Normal respiratory effort, no distress  Abdomen: Soft, gravid, nontender  Pelvic: Cervical exam deferred         Extremities:  no edema, no calf  tenderness  Psych:  mood and affect appropriate  Fetal Status: Fetal Heart Rate (bpm): 150   Movement: Absent    Chaperone: n/a    No results found for this or any previous visit (from the past 24 hour(s)).   Assessment & Plan:  1) Low-risk pregnancy G2P1001 at [redacted]w[redacted]d with an Estimated Date of Delivery: 03/02/23    Meds: No orders of the defined types were placed in this encounter.  Labs/procedures today: IT-2, anatomy scan next visit  Plan:  Continue routine obstetrical care  Next visit: prefers in person    Reviewed: Preterm labor symptoms and general obstetric precautions including but not limited to vaginal bleeding, contractions, leaking of fluid and fetal movement were reviewed in detail with the patient.  All questions were answered. Pt has home bp cuff. Check bp weekly, let us know if >140/90.   Follow-up: Return in about 4 weeks (around 10/13/2022) for LROB and ultrasound (already scheduled).  Orders Placed This Encounter  Procedures   INTEGRATED 2    Myna Hidalgo, DO Attending Obstetrician & Gynecologist, [facility] for [employer], [facility]

## 2022-09-17 LAB — INTEGRATED 2
AFP MoM: 0.99
Alpha-Fetoprotein: 28 ng/mL
Crown Rump Length: 65.7 mm
DIA MoM: 0.73
DIA Value: 107.8 pg/mL
Estriol, Unconjugated: 1.8 ng/mL
Gest. Age on Collection Date: 12.7 weeks
Gestational Age: 15.7 weeks
Maternal Age at EDD: 28.4 yr
Nuchal Translucency (NT): 1.3 mm
Nuchal Translucency MoM: 0.88
Number of Fetuses: 1
PAPP-A MoM: 0.81
PAPP-A Value: 765 ng/mL
Test Results:: NEGATIVE
Weight: 168 [lb_av]
Weight: 168 [lb_av]
hCG MoM: 0.66
hCG Value: 25.1 IU/mL
uE3 MoM: 2.08

## 2022-10-10 ENCOUNTER — Other Ambulatory Visit: Payer: Self-pay | Admitting: Obstetrics & Gynecology

## 2022-10-10 DIAGNOSIS — Z363 Encounter for antenatal screening for malformations: Secondary | ICD-10-CM

## 2022-10-11 ENCOUNTER — Ambulatory Visit (INDEPENDENT_AMBULATORY_CARE_PROVIDER_SITE_OTHER): Payer: BC Managed Care – PPO

## 2022-10-11 ENCOUNTER — Ambulatory Visit (INDEPENDENT_AMBULATORY_CARE_PROVIDER_SITE_OTHER): Payer: BC Managed Care – PPO | Admitting: Women's Health

## 2022-10-11 ENCOUNTER — Encounter: Payer: Self-pay | Admitting: Women's Health

## 2022-10-11 VITALS — BP 107/74 | HR 67 | Wt 173.8 lb

## 2022-10-11 DIAGNOSIS — Z3480 Encounter for supervision of other normal pregnancy, unspecified trimester: Secondary | ICD-10-CM

## 2022-10-11 DIAGNOSIS — Z3A19 19 weeks gestation of pregnancy: Secondary | ICD-10-CM

## 2022-10-11 DIAGNOSIS — Z3482 Encounter for supervision of other normal pregnancy, second trimester: Secondary | ICD-10-CM

## 2022-10-11 DIAGNOSIS — Z363 Encounter for antenatal screening for malformations: Secondary | ICD-10-CM

## 2022-10-11 NOTE — Progress Notes (Signed)
LOW-RISK PREGNANCY VISIT Patient name: Donna Mcknight MRN [id number]  Date of birth: Jun 02, 1995 Chief Complaint:   Routine Prenatal Visit and Pregnancy Ultrasound  History of Present Illness:   Donna Mcknight is a 27 y.o. G39P1001 female at [redacted]w[redacted]d with an Estimated Date of Delivery: 03/02/23 being seen today for ongoing management of a low-risk pregnancy.   Today she reports no complaints. Contractions: Not present. Vag. Bleeding: None.  Movement: Present. denies leaking of fluid.     08/25/2022    9:46 AM 06/30/2022    8:46 AM 08/23/2021    8:38 AM 06/21/2021    4:28 PM 01/08/2021    9:07 AM  Depression screen PHQ 2/9  Decreased Interest 0 1 0 1 0  Down, Depressed, Hopeless 0 1 0 1 0  PHQ - 2 Score 0 2 0 2 0  Altered sleeping 3 0 0 1 0  Tired, decreased energy 1 3 1 3 2   Change in appetite 3 2 0 0 1  Feeling bad or failure about yourself  0 1 0 1 0  Trouble concentrating 0 1 0 1 0  Moving slowly or fidgety/restless 0 0 0 0 0  Suicidal thoughts 0 1 0 0 0  PHQ-9 Score 7 10 1 8 3         08/25/2022    9:46 AM 06/30/2022    8:46 AM 08/23/2021    8:54 AM 06/21/2021    4:30 PM  GAD 7 : Generalized Anxiety Score  Nervous, Anxious, on Edge 2 1 1 1   Control/stop worrying 1 2 1 1   Worry too much - different things 1 1 0 1  Trouble relaxing 1 1 1  0  Restless 0 0 0 0  Easily annoyed or irritable 2 1 1 1   Afraid - awful might happen 2 0 1 0  Total GAD 7 Score 9 6 5 4       Review of Systems:   Pertinent items are noted in HPI Denies abnormal vaginal discharge w/ itching/odor/irritation, headaches, visual changes, shortness of breath, chest pain, abdominal pain, severe nausea/vomiting, or problems with urination or bowel movements unless otherwise stated above. Pertinent History Reviewed:  Reviewed past medical,surgical, social, obstetrical and family history.  Reviewed problem list, medications and allergies. Physical Assessment:   Vitals:   10/11/22 0915   BP: 107/74  Pulse: 67  Weight: 173 lb 12.8 oz (78.8 kg)  Body mass index is 28.48 kg/m.        Physical Examination:   General appearance: Well appearing, and in no distress  Mental status: Alert, oriented to person, place, and time  Skin: Warm & dry  Cardiovascular: Normal heart rate noted  Respiratory: Normal respiratory effort, no distress  Abdomen: Soft, gravid, nontender  Pelvic: Cervical exam deferred         Extremities: Edema: None  Fetal Status:     Movement: Present  [place] 19+5 wks,cephalic,posterior placenta gr 0,normal ovaries,CX 4.1 cm,SVP of fluid 4.7 cm,FHR 142 bpm,EFW 314 g 49%,anatomy complete,no obvious abnormalities   Chaperone: N/A   No results found for this or any previous visit (from the past 24 hour(s)).  Assessment & Plan:  1) Low-risk pregnancy G2P1001 at [redacted]w[redacted]d with an Estimated Date of Delivery: 03/02/23   2) H/O gDM, early A1C wnl  3) H/O PPH   Meds: No orders of the defined types were placed in this encounter.  Labs/procedures today: U/S  Plan:  Continue routine obstetrical care  Next visit: prefers  in person    Reviewed: Preterm labor symptoms and general obstetric precautions including but not limited to vaginal bleeding, contractions, leaking of fluid and fetal movement were reviewed in detail with the patient.  All questions were answered. Does have home bp cuff. Office bp cuff given: not applicable. Check bp weekly, let us know if consistently >140 and/or >90.  Follow-up: Return in about 4 weeks (around 11/08/2022) for LROB, CNM, in person.  Future Appointments  Date Time Provider Department Center  11/08/2022  8:50 AM Cheral Marker, CNM [facility]    No orders of the defined types were placed in this encounter.  Cheral Marker CNM, [facility] 10/11/2022 9:46 AM

## 2022-10-11 NOTE — Progress Notes (Signed)
[place] 19+5 wks,cephalic,posterior placenta gr 0,normal ovaries,CX 4.1 cm,SVP of fluid 4.7 cm,FHR 142 bpm,EFW 314 g 49%,anatomy complete,no obvious abnormalities

## 2022-10-11 NOTE — Patient Instructions (Signed)
Donna Mcknight, thank you for choosing our office today! We appreciate the opportunity to meet your healthcare needs. You may receive a short survey by mail, e-mail, or through [employer]. If you are happy with your care we would appreciate if you could take just a few minutes to complete the survey questions. We read all of your comments and take your feedback very seriously. Thank you again for choosing our office.  Center for [employer] Team at [facility] at [facility] ([address] [place], [place] [address]) Entrance C, located off of [address] Free 24/7 valet parking  Go to [email or web address] to register for FREE online childbirth classes  Call the office [phone]) or go to [facility] if: You begin to severe cramping Your water breaks.  Sometimes it is a big gush of fluid, sometimes it is just a trickle that keeps getting your panties wet or running down your legs You have vaginal bleeding.  It is normal to have a small amount of spotting if your cervix was checked.   [facility] Pediatricians/Family Doctors [facility]): [address] Colette Ribas, [phone]           [facility]: [address], [phone]                [facility]): [address], [phone] (call to ask if accepting patients) [facility] Department: [address], [place], [phone]    [facility] Pediatricians/Family Doctors [facility]): [phone] [address], [phone] [facility]: [address] [place], [phone] [facility]: [address], [phone]  [facility] Doctors  [facility]): [phone] [facility]: [address] [phone]   [facility] Doctors [facility]: [address] [phone]  [facility] Doctors  [facility]: [phone], [phone]  Home Blood Pressure Monitoring for Patients   Your provider has recommended that you check your blood pressure (BP) at least once a week at home. If you do not have a blood pressure cuff at home, one will be provided for you. Contact your provider if you have not received your monitor within 1 week.   Helpful Tips for Accurate Home Blood Pressure Checks  Don't smoke, exercise, or drink caffeine 30 minutes before checking your BP Use the restroom before checking your BP (a full bladder can raise your pressure) Relax in a comfortable upright chair Feet on the ground Left arm resting comfortably on a flat surface at the level of your heart Legs uncrossed Back supported Sit quietly and don't talk Place the cuff on your bare arm Adjust snuggly, so that only two fingertips can fit between your skin and the top of the cuff Check 2 readings separated by at least one minute Keep a log of your BP readings For a visual, please reference this diagram: http://ccnc.care/bpdiagram  Provider Name: [facility]     Phone: [phone]  Zone 1: ALL CLEAR  Continue to monitor your symptoms:  BP reading is less than 140 (top number) or less than 90 (bottom number)  No right upper stomach pain No headaches or seeing spots No feeling nauseated or throwing up No swelling in face and hands  Zone 2: CAUTION Call your doctor's office for any of the following:  BP reading is greater than 140 (top number) or greater than  90 (bottom number)  Stomach pain under your ribs in the middle or right side Headaches or seeing spots Feeling nauseated or throwing up Swelling in face and hands  Zone 3: EMERGENCY  Seek immediate medical care if you have any of the following:  BP reading is greater than160 (top number) or greater than 110 (bottom number) Severe headaches not improving with Tylenol Serious difficulty catching your breath Any worsening symptoms from  Zone 2     Second Trimester of Pregnancy The second trimester is from week 14 through week 27 (months 4 through 6). The second trimester is often a time when you feel your best. Your body has adjusted to being pregnant, and you begin to feel better physically. Usually, morning sickness has lessened or quit completely, you may have more energy, and you may have an increase in appetite. The second trimester is also a time when the fetus is growing rapidly. At the end of the sixth month, the fetus is about 9 inches long and weighs about 1 pounds. You will likely begin to feel the baby move (quickening) between 16 and 20 weeks of pregnancy. Body changes during your second trimester Your body continues to go through many changes during your second trimester. The changes vary from woman to woman. Your weight will continue to increase. You will notice your lower abdomen bulging out. You may begin to get stretch marks on your hips, abdomen, and breasts. You may develop headaches that can be relieved by medicines. The medicines should be approved by your health care provider. You may urinate more often because the fetus is pressing on your bladder. You may develop or continue to have heartburn as a result of your pregnancy. You may develop constipation because certain hormones are causing the muscles that push waste through your intestines to slow down. You may develop hemorrhoids or swollen, bulging veins (varicose veins). You may have back pain. This is caused by: Weight gain. Pregnancy hormones that are relaxing the joints in your pelvis. A shift in weight and the muscles that support your balance. Your breasts will continue to grow and they will continue to become tender. Your gums may bleed and may be sensitive to brushing and flossing. Dark spots or blotches (chloasma, mask of pregnancy) may develop on your face. This will likely fade after the baby is born. A dark line from your belly button to  the pubic area (linea nigra) may appear. This will likely fade after the baby is born. You may have changes in your hair. These can include thickening of your hair, rapid growth, and changes in texture. Some women also have hair loss during or after pregnancy, or hair that feels dry or thin. Your hair will most likely return to normal after your baby is born.  What to expect at prenatal visits During a routine prenatal visit: You will be weighed to make sure you and the fetus are growing normally. Your blood pressure will be taken. Your abdomen will be measured to track your baby's growth. The fetal heartbeat will be listened to. Any test results from the previous visit will be discussed.  Your health care provider may ask you: How you are feeling. If you are feeling the baby move. If you have had any abnormal symptoms, such as leaking fluid, bleeding, severe headaches, or abdominal cramping. If you are using any tobacco products, including cigarettes, chewing tobacco, and electronic cigarettes. If you have any questions.  Other tests that may be performed during   your second trimester include: Blood tests that check for: Low iron levels (anemia). High blood sugar that affects pregnant women (gestational diabetes) between 24 and 28 weeks. Rh antibodies. This is to check for a protein on red blood cells (Rh factor). Urine tests to check for infections, diabetes, or protein in the urine. An ultrasound to confirm the proper growth and development of the baby. An amniocentesis to check for possible genetic problems. Fetal screens for spina bifida and Down syndrome. HIV (human immunodeficiency virus) testing. Routine prenatal testing includes screening for HIV, unless you choose not to have this test.  Follow these instructions at home: Medicines Follow your health care provider's instructions regarding medicine use. Specific medicines may be either safe or unsafe to take during  pregnancy. Take a prenatal vitamin that contains at least 600 micrograms (mcg) of folic acid. If you develop constipation, try taking a stool softener if your health care provider approves. Eating and drinking Eat a balanced diet that includes fresh fruits and vegetables, whole grains, good sources of protein such as meat, eggs, or tofu, and low-fat dairy. Your health care provider will help you determine the amount of weight gain that is right for you. Avoid raw meat and uncooked cheese. These carry germs that can cause birth defects in the baby. If you have low calcium intake from food, talk to your health care provider about whether you should take a daily calcium supplement. Limit foods that are high in fat and processed sugars, such as fried and sweet foods. To prevent constipation: Drink enough fluid to keep your urine clear or pale yellow. Eat foods that are high in fiber, such as fresh fruits and vegetables, whole grains, and beans. Activity Exercise only as directed by your health care provider. Most women can continue their usual exercise routine during pregnancy. Try to exercise for 30 minutes at least 5 days a week. Stop exercising if you experience uterine contractions. Avoid heavy lifting, wear low heel shoes, and practice good posture. A sexual relationship may be continued unless your health care provider directs you otherwise. Relieving pain and discomfort Wear a good support bra to prevent discomfort from breast tenderness. Take warm sitz baths to soothe any pain or discomfort caused by hemorrhoids. Use hemorrhoid cream if your health care provider approves. Rest with your legs elevated if you have leg cramps or low back pain. If you develop varicose veins, wear support hose. Elevate your feet for 15 minutes, 3-4 times a day. Limit salt in your diet. Prenatal Care Write down your questions. Take them to your prenatal visits. Keep all your prenatal visits as told by your health  care provider. This is important. Safety Wear your seat belt at all times when driving. Make a list of emergency phone numbers, including numbers for family, friends, the hospital, and police and fire departments. General instructions Ask your health care provider for a referral to a local prenatal education class. Begin classes no later than the beginning of month 6 of your pregnancy. Ask for help if you have counseling or nutritional needs during pregnancy. Your health care provider can offer advice or refer you to specialists for help with various needs. Do not use hot tubs, steam rooms, or saunas. Do not douche or use tampons or scented sanitary pads. Do not cross your legs for long periods of time. Avoid cat litter boxes and soil used by cats. These carry germs that can cause birth defects in the baby and possibly loss of the   fetus by miscarriage or stillbirth. Avoid all smoking, herbs, alcohol, and unprescribed drugs. Chemicals in these products can affect the formation and growth of the baby. Do not use any products that contain nicotine or tobacco, such as cigarettes and e-cigarettes. If you need help quitting, ask your health care provider. Visit your dentist if you have not gone yet during your pregnancy. Use a soft toothbrush to brush your teeth and be gentle when you floss. Contact a health care provider if: You have dizziness. You have mild pelvic cramps, pelvic pressure, or nagging pain in the abdominal area. You have persistent nausea, vomiting, or diarrhea. You have a bad smelling vaginal discharge. You have pain when you urinate. Get help right away if: You have a fever. You are leaking fluid from your vagina. You have spotting or bleeding from your vagina. You have severe abdominal cramping or pain. You have rapid weight gain or weight loss. You have shortness of breath with chest pain. You notice sudden or extreme swelling of your face, hands, ankles, feet, or legs. You  have not felt your baby move in over an hour. You have severe headaches that do not go away when you take medicine. You have vision changes. Summary The second trimester is from week 14 through week 27 (months 4 through 6). It is also a time when the fetus is growing rapidly. Your body goes through many changes during pregnancy. The changes vary from woman to woman. Avoid all smoking, herbs, alcohol, and unprescribed drugs. These chemicals affect the formation and growth your baby. Do not use any tobacco products, such as cigarettes, chewing tobacco, and e-cigarettes. If you need help quitting, ask your health care provider. Contact your health care provider if you have any questions. Keep all prenatal visits as told by your health care provider. This is important. This information is not intended to replace advice given to you by your health care provider. Make sure you discuss any questions you have with your health care provider. Document Released: 03/22/2001 Document Revised: 09/03/2015 Document Reviewed: 05/29/2012 [employer] Interactive Patient Education  2017 [employer]  

## 2022-11-08 ENCOUNTER — Encounter: Payer: Self-pay | Admitting: Women's Health

## 2022-11-08 ENCOUNTER — Ambulatory Visit (INDEPENDENT_AMBULATORY_CARE_PROVIDER_SITE_OTHER): Payer: BC Managed Care – PPO | Admitting: Women's Health

## 2022-11-08 VITALS — BP 117/71 | HR 76 | Wt 180.0 lb

## 2022-11-08 DIAGNOSIS — Z3482 Encounter for supervision of other normal pregnancy, second trimester: Secondary | ICD-10-CM

## 2022-11-08 NOTE — Patient Instructions (Signed)
Donna Mcknight, thank you for choosing our office today! We appreciate the opportunity to meet your healthcare needs. You may receive a short survey by mail, e-mail, or through [employer]. If you are happy with your care we would appreciate if you could take just a few minutes to complete the survey questions. We read all of your comments and take your feedback very seriously. Thank you again for choosing our office.  Center for [employer] Team at [facility]  [facility] at [facility] ([address] [place], [place] [address]) Entrance C, located off of [address] Free 24/7 valet parking   You will have your sugar test next visit.  Please do not eat or drink anything after midnight the night before you come, not even water.  You will be here for at least two hours.  Please make an appointment online for the bloodwork at [email or web address] for 8:00am (or as close to this as possible). Make sure you select the [facility] service center.   CLASSES: Go to [email or web address] to register for classes (childbirth, breastfeeding, waterbirth, infant CPR, daddy bootcamp, etc.)  Call the office [phone]) or go to [facility] if: You begin to have strong, frequent contractions Your water breaks.  Sometimes it is a big gush of fluid, sometimes it is just a trickle that keeps getting your panties wet or running down your legs You have vaginal bleeding.  It is normal to have a small amount of spotting if your cervix was checked.  You don't feel your baby moving like normal.  If you don't, get you something to eat and drink and lay down and focus on feeling your baby move.   If your baby is still not moving like normal, you should call the office or go to [facility].  Call the office [phone]) or go to [facility] hospital for these signs of pre-eclampsia: Severe headache that does not go away with Tylenol Visual changes- seeing spots, double, blurred vision Pain under your right breast or  upper abdomen that does not go away with Tums or heartburn medicine Nausea and/or vomiting Severe swelling in your hands, feet, and face    [place] Pediatricians/Family Doctors [facility]): [address] Colette Ribas, [phone]           [facility]: [address], [phone]                [facility]): [address], [phone]  [facility] Department: [address], [place], [phone]    [facility] Pediatricians/Family Doctors [facility]): [address], [phone] [facility]: [address] [place], [phone] [facility]: [address], [phone]  [facility] Doctors  [facility]): [phone] [facility]: [address], [phone]   [facility] Doctors [facility]: [address] [phone]  [facility] Doctors  [facility]: [phone], [phone]  Home Blood Pressure Monitoring for Patients   Your provider has recommended that you check your blood pressure (BP) at least once a week at home. If you do not have a blood pressure cuff at home, one will be provided for you. Contact your provider if you have not received your monitor within 1 week.   Helpful Tips for Accurate Home Blood Pressure Checks  Don't smoke, exercise, or drink caffeine 30 minutes before checking  your BP Use the restroom before checking your BP (a full bladder can raise your pressure) Relax in a comfortable upright chair Feet on the ground Left arm resting comfortably on a flat surface at the level of your heart Legs uncrossed Back supported Sit quietly and don't talk Place the cuff on your bare arm Adjust snuggly, so that only two fingertips can fit between your skin and the top of the cuff Check 2 readings separated by at least one  minute Keep a log of your BP readings For a visual, please reference this diagram: http://ccnc.care/bpdiagram  Provider Name: [facility]     Phone: [phone]  Zone 1: ALL CLEAR  Continue to monitor your symptoms:  BP reading is less than 140 (top number) or less than 90 (bottom number)  No right upper stomach pain No headaches or seeing spots No feeling nauseated or throwing up No swelling in face and hands  Zone 2: CAUTION Call your doctor's office for any of the following:  BP reading is greater than 140 (top number) or greater than 90 (bottom number)  Stomach pain under your ribs in the middle or right side Headaches or seeing spots Feeling nauseated or throwing up Swelling in face and hands  Zone 3: EMERGENCY  Seek immediate medical care if you have any of the following:  BP reading is greater than160 (top number) or greater than 110 (bottom number) Severe headaches not improving with Tylenol Serious difficulty catching your breath Any worsening symptoms from Zone 2   Second Trimester of Pregnancy The second trimester is from week 13 through week 28, months 4 through 6. The second trimester is often a time when you feel your best. Your body has also adjusted to being pregnant, and you begin to feel better physically. Usually, morning sickness has lessened or quit completely, you may have more energy, and you may have an increase in appetite. The second trimester is also a time when the fetus is growing rapidly. At the end of the sixth month, the fetus is about 9 inches long and weighs about 1 pounds. You will likely begin to feel the baby move (quickening) between 18 and 20 weeks of the pregnancy. BODY CHANGES Your body goes through many changes during pregnancy. The changes vary from woman to woman.  Your weight will continue to increase. You will notice your lower abdomen bulging out. You may begin to get stretch marks on your hips, abdomen, and breasts. You may  develop headaches that can be relieved by medicines approved by your health care provider. You may urinate more often because the fetus is pressing on your bladder. You may develop or continue to have heartburn as a result of your pregnancy. You may develop constipation because certain hormones are causing the muscles that push waste through your intestines to slow down. You may develop hemorrhoids or swollen, bulging veins (varicose veins). You may have back pain because of the weight gain and pregnancy hormones relaxing your joints between the bones in your pelvis and as a result of a shift in weight and the muscles that support your balance. Your breasts will continue to grow and be tender. Your gums may bleed and may be sensitive to brushing and flossing. Dark spots or blotches (chloasma, mask of pregnancy) may develop on your face. This will likely fade after the baby is born. A dark line from your belly button to the pubic area (linea nigra) may appear. This will likely fade after the  baby is born. You may have changes in your hair. These can include thickening of your hair, rapid growth, and changes in texture. Some women also have hair loss during or after pregnancy, or hair that feels dry or thin. Your hair will most likely return to normal after your baby is born. WHAT TO EXPECT AT YOUR PRENATAL VISITS During a routine prenatal visit: You will be weighed to make sure you and the fetus are growing normally. Your blood pressure will be taken. Your abdomen will be measured to track your baby's growth. The fetal heartbeat will be listened to. Any test results from the previous visit will be discussed. Your health care provider may ask you: How you are feeling. If you are feeling the baby move. If you have had any abnormal symptoms, such as leaking fluid, bleeding, severe headaches, or abdominal cramping. If you have any questions. Other tests that may be performed during your second  trimester include: Blood tests that check for: Low iron levels (anemia). Gestational diabetes (between 24 and 28 weeks). Rh antibodies. Urine tests to check for infections, diabetes, or protein in the urine. An ultrasound to confirm the proper growth and development of the baby. An amniocentesis to check for possible genetic problems. Fetal screens for spina bifida and Down syndrome. HOME CARE INSTRUCTIONS  Avoid all smoking, herbs, alcohol, and unprescribed drugs. These chemicals affect the formation and growth of the baby. Follow your health care provider's instructions regarding medicine use. There are medicines that are either safe or unsafe to take during pregnancy. Exercise only as directed by your health care provider. Experiencing uterine cramps is a good sign to stop exercising. Continue to eat regular, healthy meals. Wear a good support bra for breast tenderness. Do not use hot tubs, steam rooms, or saunas. Wear your seat belt at all times when driving. Avoid raw meat, uncooked cheese, cat litter boxes, and soil used by cats. These carry germs that can cause birth defects in the baby. Take your prenatal vitamins. Try taking a stool softener (if your health care provider approves) if you develop constipation. Eat more high-fiber foods, such as fresh vegetables or fruit and whole grains. Drink plenty of fluids to keep your urine clear or pale yellow. Take warm sitz baths to soothe any pain or discomfort caused by hemorrhoids. Use hemorrhoid cream if your health care provider approves. If you develop varicose veins, wear support hose. Elevate your feet for 15 minutes, 3-4 times a day. Limit salt in your diet. Avoid heavy lifting, wear low heel shoes, and practice good posture. Rest with your legs elevated if you have leg cramps or low back pain. Visit your dentist if you have not gone yet during your pregnancy. Use a soft toothbrush to brush your teeth and be gentle when you floss. A  sexual relationship may be continued unless your health care provider directs you otherwise. Continue to go to all your prenatal visits as directed by your health care provider. SEEK MEDICAL CARE IF:  You have dizziness. You have mild pelvic cramps, pelvic pressure, or nagging pain in the abdominal area. You have persistent nausea, vomiting, or diarrhea. You have a bad smelling vaginal discharge. You have pain with urination. SEEK IMMEDIATE MEDICAL CARE IF:  You have a fever. You are leaking fluid from your vagina. You have spotting or bleeding from your vagina. You have severe abdominal cramping or pain. You have rapid weight gain or loss. You have shortness of breath with chest pain. You  notice sudden or extreme swelling of your face, hands, ankles, feet, or legs. You have not felt your baby move in over an hour. You have severe headaches that do not go away with medicine. You have vision changes. Document Released: 03/22/2001 Document Revised: 04/02/2013 Document Reviewed: 05/29/2012 [facility] Patient Information 2015 [place], [place]. This information is not intended to replace advice given to you by your health care provider. Make sure you discuss any questions you have with your health care provider.

## 2022-11-08 NOTE — Progress Notes (Signed)
LOW-RISK PREGNANCY VISIT Patient name: Donna Mcknight MRN [id number]  Date of birth: 07-09-1995 Chief Complaint:   Routine Prenatal Visit  History of Present Illness:   Donna Mcknight is a 27 y.o. G63P1001 female at [redacted]w[redacted]d with an Estimated Date of Delivery: 03/02/23 being seen today for ongoing management of a low-risk pregnancy.   Today she reports  decreased sex drive, is uncomfortable . Contractions: Not present. Vag. Bleeding: None.  Movement: Present. denies leaking of fluid.     08/25/2022    9:46 AM 06/30/2022    8:46 AM 08/23/2021    8:38 AM 06/21/2021    4:28 PM 01/08/2021    9:07 AM  Depression screen PHQ 2/9  Decreased Interest 0 1 0 1 0  Down, Depressed, Hopeless 0 1 0 1 0  PHQ - 2 Score 0 2 0 2 0  Altered sleeping 3 0 0 1 0  Tired, decreased energy 1 3 1 3 2   Change in appetite 3 2 0 0 1  Feeling bad or failure about yourself  0 1 0 1 0  Trouble concentrating 0 1 0 1 0  Moving slowly or fidgety/restless 0 0 0 0 0  Suicidal thoughts 0 1 0 0 0  PHQ-9 Score 7 10 1 8 3         08/25/2022    9:46 AM 06/30/2022    8:46 AM 08/23/2021    8:54 AM 06/21/2021    4:30 PM  GAD 7 : Generalized Anxiety Score  Nervous, Anxious, on Edge 2 1 1 1   Control/stop worrying 1 2 1 1   Worry too much - different things 1 1 0 1  Trouble relaxing 1 1 1  0  Restless 0 0 0 0  Easily annoyed or irritable 2 1 1 1   Afraid - awful might happen 2 0 1 0  Total GAD 7 Score 9 6 5 4       Review of Systems:   Pertinent items are noted in HPI Denies abnormal vaginal discharge w/ itching/odor/irritation, headaches, visual changes, shortness of breath, chest pain, abdominal pain, severe nausea/vomiting, or problems with urination or bowel movements unless otherwise stated above. Pertinent History Reviewed:  Reviewed past medical,surgical, social, obstetrical and family history.  Reviewed problem list, medications and allergies. Physical Assessment:   Vitals:   11/08/22 0902   BP: 117/71  Pulse: 76  Weight: 180 lb (81.6 kg)  Body mass index is 29.5 kg/m.        Physical Examination:   General appearance: Well appearing, and in no distress  Mental status: Alert, oriented to person, place, and time  Skin: Warm & dry  Cardiovascular: Normal heart rate noted  Respiratory: Normal respiratory effort, no distress  Abdomen: Soft, gravid, nontender  Pelvic: Cervical exam deferred         Extremities: Edema: None  Fetal Status: Fetal Heart Rate (bpm): 142 Fundal Height: 23 cm Movement: Present    Chaperone: N/A   No results found for this or any previous visit (from the past 24 hour(s)).  Assessment & Plan:  1) Low-risk pregnancy G2P1001 at [redacted]w[redacted]d with an Estimated Date of Delivery: 03/02/23   2) Decreased sex drive, is uncomfortable, discussed pelvic congestion, different positions, lubrications, listening to body.    Meds: No orders of the defined types were placed in this encounter.  Labs/procedures today: none  Plan:  Continue routine obstetrical care  Next visit: prefers will be in person for pn2  Reviewed: Preterm labor symptoms and general obstetric precautions including but not limited to vaginal bleeding, contractions, leaking of fluid and fetal movement were reviewed in detail with the patient.  All questions were answered. Does have home bp cuff. Office bp cuff given: not applicable. Check bp weekly, let us know if consistently >140 and/or >90.  Follow-up: Return in about 4 weeks (around 12/06/2022) for LROB, PN2, CNM, in person.  No future appointments.  No orders of the defined types were placed in this encounter.  Cheral Marker CNM, [facility] 11/08/2022 9:12 AM

## 2022-11-21 ENCOUNTER — Inpatient Hospital Stay (HOSPITAL_COMMUNITY)
Admission: AD | Admit: 2022-11-21 | Discharge: 2022-11-21 | Disposition: A | Discharge status: Home / Self Care | Payer: BC Managed Care – PPO | Attending: Obstetrics and Gynecology | Admitting: Obstetrics and Gynecology

## 2022-11-21 ENCOUNTER — Encounter: Payer: Self-pay | Admitting: Women's Health

## 2022-11-21 ENCOUNTER — Encounter (HOSPITAL_COMMUNITY): Payer: Self-pay | Admitting: Obstetrics and Gynecology

## 2022-11-21 DIAGNOSIS — O26892 Other specified pregnancy related conditions, second trimester: Secondary | ICD-10-CM | POA: Insufficient documentation

## 2022-11-21 DIAGNOSIS — R102 Pelvic and perineal pain: Secondary | ICD-10-CM | POA: Insufficient documentation

## 2022-11-21 DIAGNOSIS — O479 False labor, unspecified: Secondary | ICD-10-CM | POA: Diagnosis not present

## 2022-11-21 DIAGNOSIS — Z3A25 25 weeks gestation of pregnancy: Secondary | ICD-10-CM | POA: Insufficient documentation

## 2022-11-21 DIAGNOSIS — R109 Unspecified abdominal pain: Secondary | ICD-10-CM

## 2022-11-21 DIAGNOSIS — N898 Other specified noninflammatory disorders of vagina: Secondary | ICD-10-CM | POA: Diagnosis not present

## 2022-11-21 DIAGNOSIS — O4702 False labor before 37 completed weeks of gestation, second trimester: Secondary | ICD-10-CM | POA: Insufficient documentation

## 2022-11-21 HISTORY — DX: Urinary tract infection, site not specified: N39.0

## 2022-11-21 HISTORY — DX: Anemia, unspecified: D64.9

## 2022-11-21 LAB — URINALYSIS, ROUTINE W REFLEX MICROSCOPIC
Bilirubin Urine: NEGATIVE
Glucose, UA: NEGATIVE mg/dL
Hgb urine dipstick: NEGATIVE
Ketones, ur: NEGATIVE mg/dL
Leukocytes,Ua: NEGATIVE
Nitrite: NEGATIVE
Protein, ur: NEGATIVE mg/dL
Specific Gravity, Urine: 1.003 — ABNORMAL LOW (ref 1.005–1.030)
pH: 6 (ref 5.0–8.0)

## 2022-11-21 LAB — WET PREP, GENITAL
Clue Cells Wet Prep HPF POC: NONE SEEN
Sperm: NONE SEEN
Trich, Wet Prep: NONE SEEN
WBC, Wet Prep HPF POC: <10 (ref <10)
Yeast Wet Prep HPF POC: NONE SEEN

## 2022-11-21 NOTE — MAU Provider Note (Signed)
History     CSN: [id number]  Arrival date and time: 11/21/22 1518   Event Date/Time   First Provider Initiated Contact with Patient 11/21/22 1608      Chief Complaint  Patient presents with   Abdominal Pain   Vaginal Discharge   Donna Mcknight , a  27 y.o. G2P1001 at [redacted]w[redacted]d presents to MAU with complaints of losing her mucus plug this morning and abdominal cramping. She states that yesterday she was having some mild intermittent cramping that started yesterday. She states that it was very irregular and she drank some more water and it let up. She states that earlier today she noted that the cramping became worse and closer together. She also reports just "overall abdominal discomfort". Currently rates pain a 4/10 but denies attempting to relieve symptoms. Denies abnormal vaginal discharge, vaginal bleeding, leaking of fluid and endorses positive fetal movement. She denies problems with diarrhea or constipation. She also endorses recent intercourse without complaints. She states after losing her mucus plug with cramping she became concerned and reported to MAU.          OB History     Gravida  2   Para  1   Term  1   Preterm  0   AB  0   Living  1      SAB  0   IAB  0   Ectopic  0   Multiple  0   Live Births  1           Past Medical History:  Diagnosis Date   Anemia    as teenager   Anxiety    Depression    Gestational diabetes    first preg   Mental disorder    anxiety, depression   UTI (urinary tract infection)     Past Surgical History:  Procedure Laterality Date   MOUTH SURGERY      Family History  Problem Relation Age of Onset   Cancer Maternal Grandmother    Diabetes Maternal Grandfather    Cirrhosis Father    Hypertension Mother    Fibroids Mother     Social History   Tobacco Use   Smoking status: Never   Smokeless tobacco: Never   Tobacco comments:    Quit 2014  Vaping Use   Vaping status: Former  Substance  Use Topics   Alcohol use: Not Currently    Alcohol/week: 10.0 standard drinks of alcohol    Types: 10 Glasses of wine per week    Comment: almost everyday   Drug use: Not Currently    Types: Marijuana    Comment: prior to preg    Allergies:  Allergies  Allergen Reactions   Nickel     Other reaction(s): edema   Other Hives and Swelling    Nickel      Medications Prior to Admission  Medication Sig Dispense Refill Last Dose   acetaminophen (TYLENOL) 325 MG tablet Take 650 mg by mouth every 6 (six) hours as needed.   Past Month   Prenatal Vit-Fe Fumarate-FA (PRENATAL VITAMIN PO) Take by mouth.   11/21/2022    Review of Systems  Constitutional:  Negative for chills, fatigue and fever.  Eyes:  Negative for pain and visual disturbance.  Respiratory:  Negative for apnea, shortness of breath and wheezing.   Cardiovascular:  Negative for chest pain and palpitations.  Gastrointestinal:  Positive for abdominal pain. Negative for constipation, diarrhea, nausea and vomiting.  Genitourinary:  Positive  for pelvic pain and vaginal discharge. Negative for difficulty urinating, dysuria, vaginal bleeding and vaginal pain.  Musculoskeletal:  Positive for back pain.  Neurological:  Negative for seizures, weakness and headaches.  Psychiatric/Behavioral:  Negative for suicidal ideas.    Physical Exam   Blood pressure 119/77, pulse 80, temperature 98 F (36.7 C), temperature source Oral, resp. rate 17, height 5' 5.5" (1.664 m), weight 83.1 kg, last menstrual period 05/26/2022, not currently breastfeeding.  Physical Exam Vitals and nursing note reviewed. Exam conducted with a chaperone present.  Constitutional:      General: She is not in acute distress.    Appearance: Normal appearance.  HENT:     Head: Normocephalic.  Pulmonary:     Effort: Pulmonary effort is normal.  Abdominal:     Palpations: Abdomen is soft.     Tenderness: There is no abdominal tenderness.     Comments: Gravid  uterus   Genitourinary:    Vagina: Normal.  Musculoskeletal:     Cervical back: Normal range of motion.  Skin:    General: Skin is warm and dry.  Neurological:     Mental Status: She is alert and oriented to person, place, and time.  Psychiatric:        Mood and Affect: Mood normal.    FHT: 140 bpm with moderate variability (appropriate for gestational age. ) Toco Quiet   MAU Course  Procedures Orders Placed This Encounter  Procedures   Wet prep, genital   Urinalysis, Routine w reflex microscopic -Urine, Clean Catch   Dilation: Fingertip Effacement (%): Thick Cervical Position: Posterior Station:  (out of pelvis) Exam by:: Suzie Portela CNM  MDM - Low suspicion for Preterm labor  - UA low SG otherwise normal  - Wet prep and GC pending upon discharge.  - plan for discharge.   Assessment and Plan   1. Abdominal cramping   2. Vaginal discharge during pregnancy in second trimester   3. [redacted] weeks gestation of pregnancy   4. False labor    - Reviewed signs and symptoms of labor.  - Encouraged to increase oral hydration and eat.  - Reviewed worsening signs and return precautions.  - FHT appropriate for gestational age at time of discharge.  - Patient discharged home in stable condition and may return to MAU as needed.   Claudette Head, MSN CNM  11/21/2022, 4:08 PM

## 2022-11-21 NOTE — MAU Note (Signed)
Donna Mcknight is a 27 y.o. at [redacted]w[redacted]d here in MAU reporting: started having contractions and feeling pressure yesterday, nothing regular, pains were hours apart.  Has been cramping all day today and gotten more intense. No bleeding or leaking, 'lost her mucous plug' contacted her office and they felt she needed to come in .  Reports +FM  Onset of complaint: yesterday Pain score: 4 Vitals:   11/21/22 1542  BP: 119/77  Pulse: 80  Resp: 17  Temp: 98 F (36.7 C)     FHT:148 Lab orders placed from triage:  urine

## 2022-12-05 ENCOUNTER — Encounter: Payer: Self-pay | Admitting: Women's Health

## 2022-12-05 ENCOUNTER — Ambulatory Visit: Payer: BC Managed Care – PPO | Admitting: Women's Health

## 2022-12-05 ENCOUNTER — Other Ambulatory Visit: Payer: BC Managed Care – PPO

## 2022-12-05 VITALS — BP 111/72 | HR 80 | Wt 187.0 lb

## 2022-12-05 DIAGNOSIS — Z3480 Encounter for supervision of other normal pregnancy, unspecified trimester: Secondary | ICD-10-CM

## 2022-12-05 DIAGNOSIS — Z3A27 27 weeks gestation of pregnancy: Secondary | ICD-10-CM

## 2022-12-05 DIAGNOSIS — Z23 Encounter for immunization: Secondary | ICD-10-CM | POA: Diagnosis not present

## 2022-12-05 DIAGNOSIS — Z3482 Encounter for supervision of other normal pregnancy, second trimester: Secondary | ICD-10-CM

## 2022-12-05 DIAGNOSIS — Z131 Encounter for screening for diabetes mellitus: Secondary | ICD-10-CM

## 2022-12-05 NOTE — Progress Notes (Signed)
LOW-RISK PREGNANCY VISIT Patient name: Donna Mcknight MRN [id number]  Date of birth: 1996-02-07 Chief Complaint:   Routine Prenatal Visit (PN2 today)  History of Present Illness:   Donna Mcknight is a 27 y.o. G3P1001 female at [redacted]w[redacted]d with an Estimated Date of Delivery: 03/02/23 being seen today for ongoing management of a low-risk pregnancy.   Today she reports no complaints. Contractions: Not present. Vag. Bleeding: None.  Movement: Present. denies leaking of fluid.     08/25/2022    9:46 AM 06/30/2022    8:46 AM 08/23/2021    8:38 AM 06/21/2021    4:28 PM 01/08/2021    9:07 AM  Depression screen PHQ 2/9  Decreased Interest 0 1 0 1 0  Down, Depressed, Hopeless 0 1 0 1 0  PHQ - 2 Score 0 2 0 2 0  Altered sleeping 3 0 0 1 0  Tired, decreased energy 1 3 1 3 2   Change in appetite 3 2 0 0 1  Feeling bad or failure about yourself  0 1 0 1 0  Trouble concentrating 0 1 0 1 0  Moving slowly or fidgety/restless 0 0 0 0 0  Suicidal thoughts 0 1 0 0 0  PHQ-9 Score 7 10 1 8 3         08/25/2022    9:46 AM 06/30/2022    8:46 AM 08/23/2021    8:54 AM 06/21/2021    4:30 PM  GAD 7 : Generalized Anxiety Score  Nervous, Anxious, on Edge 2 1 1 1   Control/stop worrying 1 2 1 1   Worry too much - different things 1 1 0 1  Trouble relaxing 1 1 1  0  Restless 0 0 0 0  Easily annoyed or irritable 2 1 1 1   Afraid - awful might happen 2 0 1 0  Total GAD 7 Score 9 6 5 4       Review of Systems:   Pertinent items are noted in HPI Denies abnormal vaginal discharge w/ itching/odor/irritation, headaches, visual changes, shortness of breath, chest pain, abdominal pain, severe nausea/vomiting, or problems with urination or bowel movements unless otherwise stated above. Pertinent History Reviewed:  Reviewed past medical,surgical, social, obstetrical and family history.  Reviewed problem list, medications and allergies. Physical Assessment:   Vitals:   12/05/22 0900  BP: 111/72   Pulse: 80  Weight: 187 lb (84.8 kg)  Body mass index is 30.65 kg/m.        Physical Examination:   General appearance: Well appearing, and in no distress  Mental status: Alert, oriented to person, place, and time  Skin: Warm & dry  Cardiovascular: Normal heart rate noted  Respiratory: Normal respiratory effort, no distress  Abdomen: Soft, gravid, nontender  Pelvic: Cervical exam deferred         Extremities: Edema: None  Fetal Status: Fetal Heart Rate (bpm): 154 Fundal Height: 25 cm Movement: Present    Chaperone: N/A   No results found for this or any previous visit (from the past 24 hour(s)).  Assessment & Plan:  1) Low-risk pregnancy G2P1001 at [redacted]w[redacted]d with an Estimated Date of Delivery: 03/02/23   2) H/O GDM, GTT today   Meds: No orders of the defined types were placed in this encounter.  Labs/procedures today: tdap and PN2  Plan:  Continue routine obstetrical care  Next visit: prefers in person    Reviewed: Preterm labor symptoms and general obstetric precautions including but not limited to vaginal bleeding, contractions, leaking  of fluid and fetal movement were reviewed in detail with the patient.  All questions were answered. Does have home bp cuff. Office bp cuff given: not applicable. Check bp weekly, let us know if consistently >140 and/or >90.  Follow-up: Return in about 3 weeks (around 12/26/2022) for LROB, CNM.  Future Appointments  Date Time Provider Department Center  12/05/2022 10:10 AM Cheral Marker, CNM [facility]    No orders of the defined types were placed in this encounter.  Cheral Marker CNM, [facility] 12/05/2022 9:09 AM

## 2022-12-05 NOTE — Patient Instructions (Signed)
Gustie, thank you for choosing our office today! We appreciate the opportunity to meet your healthcare needs. You may receive a short survey by mail, e-mail, or through [employer]. If you are happy with your care we would appreciate if you could take just a few minutes to complete the survey questions. We read all of your comments and take your feedback very seriously. Thank you again for choosing our office.  Center for [employer] Team at [facility]  [facility] at [facility] ([address] [place], [place] [address]) Entrance C, located off of [address] Free 24/7 valet parking   CLASSES: Go to [email or web address] to register for classes (childbirth, breastfeeding, waterbirth, infant CPR, daddy bootcamp, etc.)  Call the office [phone]) or go to [facility] if: You begin to have strong, frequent contractions Your water breaks.  Sometimes it is a big gush of fluid, sometimes it is just a trickle that keeps getting your panties wet or running down your legs You have vaginal bleeding.  It is normal to have a small amount of spotting if your cervix was checked.  You don't feel your baby moving like normal.  If you don't, get you something to eat and drink and lay down and focus on feeling your baby move.   If your baby is still not moving like normal, you should call the office or go to [facility].  Call the office [phone]) or go to [facility] hospital for these signs of pre-eclampsia: Severe headache that does not go away with Tylenol Visual changes- seeing spots, double, blurred vision Pain under your right breast or upper abdomen that does not go away with Tums or heartburn medicine Nausea and/or vomiting Severe swelling in your hands, feet, and face   Tdap Vaccine It is recommended that you get the Tdap vaccine during the third trimester of EACH pregnancy to help protect your baby from getting pertussis (whooping cough) 27-36 weeks is the BEST time to do  this so that you can pass the protection on to your baby. During pregnancy is better than after pregnancy, but if you are unable to get it during pregnancy it will be offered at the hospital.  You can get this vaccine with [place], at the health department, your family doctor, or some local pharmacies Everyone who will be around your baby should also be up-to-date on their vaccines before the baby comes. Adults (who are not pregnant) only need 1 dose of Tdap during adulthood.   [facility] Pediatricians/Family Doctors [facility]): [address] Colette Ribas, [phone]           [facility]: [address], [phone]                [facility]): [address], [phone] (call to ask if accepting patients) [facility] Department: [address], [place], [phone]    [facility] Pediatricians/Family Doctors [facility]): [phone] [address], [phone] [facility]: [address] [place], [phone] [facility]: [address], [phone]  [facility] Doctors  [facility]): [phone] [facility]: [address], [phone]   [facility] Doctors [facility]: [address], [phone]  [facility] Family Doctors  [facility]: [phone], [phone]  Home Blood Pressure Monitoring for Patients   Your provider has recommended that you check your  blood pressure (BP) at least once a week at home. If you do not have a blood pressure cuff at home, one will be provided for you. Contact your provider if you have not received your monitor within 1 week.   Helpful Tips for Accurate Home Blood Pressure Checks  Don't smoke, exercise, or drink caffeine 30 minutes before checking your BP Use the restroom before checking your BP (a full bladder can raise your  pressure) Relax in a comfortable upright chair Feet on the ground Left arm resting comfortably on a flat surface at the level of your heart Legs uncrossed Back supported Sit quietly and don't talk Place the cuff on your bare arm Adjust snuggly, so that only two fingertips can fit between your skin and the top of the cuff Check 2 readings separated by at least one minute Keep a log of your BP readings For a visual, please reference this diagram: http://ccnc.care/bpdiagram  Provider Name: [facility]     Phone: [phone]  Zone 1: ALL CLEAR  Continue to monitor your symptoms:  BP reading is less than 140 (top number) or less than 90 (bottom number)  No right upper stomach pain No headaches or seeing spots No feeling nauseated or throwing up No swelling in face and hands  Zone 2: CAUTION Call your doctor's office for any of the following:  BP reading is greater than 140 (top number) or greater than 90 (bottom number)  Stomach pain under your ribs in the middle or right side Headaches or seeing spots Feeling nauseated or throwing up Swelling in face and hands  Zone 3: EMERGENCY  Seek immediate medical care if you have any of the following:  BP reading is greater than160 (top number) or greater than 110 (bottom number) Severe headaches not improving with Tylenol Serious difficulty catching your breath Any worsening symptoms from Zone 2   Third Trimester of Pregnancy The third trimester is from week 29 through week 42, months 7 through 9. The third trimester is a time when the fetus is growing rapidly. At the end of the ninth month, the fetus is about 20 inches in length and weighs 6-10 pounds.  BODY CHANGES Your body goes through many changes during pregnancy. The changes vary from woman to woman.  Your weight will continue to increase. You can expect to gain 25-35 pounds (11-16 kg) by the end of the pregnancy. You may begin to get stretch marks on your hips, abdomen,  and breasts. You may urinate more often because the fetus is moving lower into your pelvis and pressing on your bladder. You may develop or continue to have heartburn as a result of your pregnancy. You may develop constipation because certain hormones are causing the muscles that push waste through your intestines to slow down. You may develop hemorrhoids or swollen, bulging veins (varicose veins). You may have pelvic pain because of the weight gain and pregnancy hormones relaxing your joints between the bones in your pelvis. Backaches may result from overexertion of the muscles supporting your posture. You may have changes in your hair. These can include thickening of your hair, rapid growth, and changes in texture. Some women also have hair loss during or after pregnancy, or hair that feels dry or thin. Your hair will most likely return to normal after your baby is born. Your breasts will continue to grow and be tender. A yellow discharge may leak from your breasts called colostrum. Your belly button may stick out. You may  feel short of breath because of your expanding uterus. You may notice the fetus "dropping," or moving lower in your abdomen. You may have a bloody mucus discharge. This usually occurs a few days to a week before labor begins. Your cervix becomes thin and soft (effaced) near your due date. WHAT TO EXPECT AT YOUR PRENATAL EXAMS  You will have prenatal exams every 2 weeks until week 36. Then, you will have weekly prenatal exams. During a routine prenatal visit: You will be weighed to make sure you and the fetus are growing normally. Your blood pressure is taken. Your abdomen will be measured to track your baby's growth. The fetal heartbeat will be listened to. Any test results from the previous visit will be discussed. You may have a cervical check near your due date to see if you have effaced. At around 36 weeks, your caregiver will check your cervix. At the same time, your  caregiver will also perform a test on the secretions of the vaginal tissue. This test is to determine if a type of bacteria, Group B streptococcus, is present. Your caregiver will explain this further. Your caregiver may ask you: What your birth plan is. How you are feeling. If you are feeling the baby move. If you have had any abnormal symptoms, such as leaking fluid, bleeding, severe headaches, or abdominal cramping. If you have any questions. Other tests or screenings that may be performed during your third trimester include: Blood tests that check for low iron levels (anemia). Fetal testing to check the health, activity level, and growth of the fetus. Testing is done if you have certain medical conditions or if there are problems during the pregnancy. FALSE LABOR You may feel small, irregular contractions that eventually go away. These are called Braxton Hicks contractions, or false labor. Contractions may last for hours, days, or even weeks before true labor sets in. If contractions come at regular intervals, intensify, or become painful, it is best to be seen by your caregiver.  SIGNS OF LABOR  Menstrual-like cramps. Contractions that are 5 minutes apart or less. Contractions that start on the top of the uterus and spread down to the lower abdomen and back. A sense of increased pelvic pressure or back pain. A watery or bloody mucus discharge that comes from the vagina. If you have any of these signs before the 37th week of pregnancy, call your caregiver right away. You need to go to the hospital to get checked immediately. HOME CARE INSTRUCTIONS  Avoid all smoking, herbs, alcohol, and unprescribed drugs. These chemicals affect the formation and growth of the baby. Follow your caregiver's instructions regarding medicine use. There are medicines that are either safe or unsafe to take during pregnancy. Exercise only as directed by your caregiver. Experiencing uterine cramps is a good sign to  stop exercising. Continue to eat regular, healthy meals. Wear a good support bra for breast tenderness. Do not use hot tubs, steam rooms, or saunas. Wear your seat belt at all times when driving. Avoid raw meat, uncooked cheese, cat litter boxes, and soil used by cats. These carry germs that can cause birth defects in the baby. Take your prenatal vitamins. Try taking a stool softener (if your caregiver approves) if you develop constipation. Eat more high-fiber foods, such as fresh vegetables or fruit and whole grains. Drink plenty of fluids to keep your urine clear or pale yellow. Take warm sitz baths to soothe any pain or discomfort caused by hemorrhoids. Use hemorrhoid cream if  your caregiver approves. If you develop varicose veins, wear support hose. Elevate your feet for 15 minutes, 3-4 times a day. Limit salt in your diet. Avoid heavy lifting, wear low heal shoes, and practice good posture. Rest a lot with your legs elevated if you have leg cramps or low back pain. Visit your dentist if you have not gone during your pregnancy. Use a soft toothbrush to brush your teeth and be gentle when you floss. A sexual relationship may be continued unless your caregiver directs you otherwise. Do not travel far distances unless it is absolutely necessary and only with the approval of your caregiver. Take prenatal classes to understand, practice, and ask questions about the labor and delivery. Make a trial run to the hospital. Pack your hospital bag. Prepare the baby's nursery. Continue to go to all your prenatal visits as directed by your caregiver. SEEK MEDICAL CARE IF: You are unsure if you are in labor or if your water has broken. You have dizziness. You have mild pelvic cramps, pelvic pressure, or nagging pain in your abdominal area. You have persistent nausea, vomiting, or diarrhea. You have a bad smelling vaginal discharge. You have pain with urination. SEEK IMMEDIATE MEDICAL CARE IF:  You  have a fever. You are leaking fluid from your vagina. You have spotting or bleeding from your vagina. You have severe abdominal cramping or pain. You have rapid weight loss or gain. You have shortness of breath with chest pain. You notice sudden or extreme swelling of your face, hands, ankles, feet, or legs. You have not felt your baby move in over an hour. You have severe headaches that do not go away with medicine. You have vision changes. Document Released: 03/22/2001 Document Revised: 04/02/2013 Document Reviewed: 05/29/2012 [facility] Patient Information 2015 [place], [place]. This information is not intended to replace advice given to you by your health care provider. Make sure you discuss any questions you have with your health care provider.

## 2022-12-06 LAB — GLUCOSE TOLERANCE, 2 HOURS W/ 1HR
Glucose, 1 hour: 157 mg/dL (ref 70–179)
Glucose, 2 hour: 152 mg/dL (ref 70–152)
Glucose, Fasting: 80 mg/dL (ref 70–91)

## 2022-12-06 LAB — ANTIBODY SCREEN: Antibody Screen: NEGATIVE

## 2022-12-06 LAB — CBC
Hematocrit: 36 % (ref 34.0–46.6)
Hemoglobin: 11.7 g/dL (ref 11.1–15.9)
MCH: 29.5 pg (ref 26.6–33.0)
MCHC: 32.5 g/dL (ref 31.5–35.7)
MCV: 91 fL (ref 79–97)
Platelets: 308 10*3/uL (ref 150–450)
RBC: 3.96 x10E6/uL (ref 3.77–5.28)
RDW: 13.4 % (ref 11.7–15.4)
WBC: 9.5 10*3/uL (ref 3.4–10.8)

## 2022-12-06 LAB — HIV ANTIBODY (ROUTINE TESTING W REFLEX): HIV Screen 4th Generation wRfx: NONREACTIVE

## 2022-12-06 LAB — RPR: RPR Ser Ql: NONREACTIVE

## 2022-12-26 ENCOUNTER — Ambulatory Visit (INDEPENDENT_AMBULATORY_CARE_PROVIDER_SITE_OTHER): Payer: BC Managed Care – PPO | Admitting: Women's Health

## 2022-12-26 ENCOUNTER — Encounter: Payer: Self-pay | Admitting: Women's Health

## 2022-12-26 VITALS — BP 119/78 | HR 76 | Wt 193.2 lb

## 2022-12-26 DIAGNOSIS — Z3483 Encounter for supervision of other normal pregnancy, third trimester: Secondary | ICD-10-CM

## 2022-12-26 DIAGNOSIS — Z3A30 30 weeks gestation of pregnancy: Secondary | ICD-10-CM

## 2022-12-26 MED ORDER — FERROUS SULFATE 325 (65 FE) MG PO TABS: 325.0000 mg | ORAL_TABLET | ORAL | 2 refills | Status: DC

## 2022-12-26 NOTE — Progress Notes (Signed)
LOW-RISK PREGNANCY VISIT Patient name: Donna Mcknight MRN [id number]  Date of birth: 10/22/1995 Chief Complaint:   Routine Prenatal Visit Micah Flesher to hospital in [place] last week due to bleeding w/clots, SOB)  History of Present Illness:   Donna Mcknight is a 27 y.o. G55P1001 female at [redacted]w[redacted]d with an Estimated Date of Delivery: 03/02/23 being seen today for ongoing management of a low-risk pregnancy.   Today she reports  was in [place] last week for work, had pink d/c on Wed, then Wed evening went to br and had bright red blood, then some small clots, went to hospital there, OB did bedside u/s and said looked like there was a small clot near placenta and excess fluid around baby. Hgb was 10.7, UA neg for everything but blood, leuk esterase, wet prep neg. No further bleeding. . Contractions: Irritability. Vag. Bleeding: None (bleeding last Thursday).  Movement: Present. denies leaking of fluid.     08/25/2022    9:46 AM 06/30/2022    8:46 AM 08/23/2021    8:38 AM 06/21/2021    4:28 PM 01/08/2021    9:07 AM  Depression screen PHQ 2/9  Decreased Interest 0 1 0 1 0  Down, Depressed, Hopeless 0 1 0 1 0  PHQ - 2 Score 0 2 0 2 0  Altered sleeping 3 0 0 1 0  Tired, decreased energy 1 3 1 3 2   Change in appetite 3 2 0 0 1  Feeling bad or failure about yourself  0 1 0 1 0  Trouble concentrating 0 1 0 1 0  Moving slowly or fidgety/restless 0 0 0 0 0  Suicidal thoughts 0 1 0 0 0  PHQ-9 Score 7 10 1 8 3         08/25/2022    9:46 AM 06/30/2022    8:46 AM 08/23/2021    8:54 AM 06/21/2021    4:30 PM  GAD 7 : Generalized Anxiety Score  Nervous, Anxious, on Edge 2 1 1 1   Control/stop worrying 1 2 1 1   Worry too much - different things 1 1 0 1  Trouble relaxing 1 1 1  0  Restless 0 0 0 0  Easily annoyed or irritable 2 1 1 1   Afraid - awful might happen 2 0 1 0  Total GAD 7 Score 9 6 5 4       Review of Systems:   Pertinent items are noted in HPI Denies abnormal vaginal  discharge w/ itching/odor/irritation, headaches, visual changes, shortness of breath, chest pain, abdominal pain, severe nausea/vomiting, or problems with urination or bowel movements unless otherwise stated above. Pertinent History Reviewed:  Reviewed past medical,surgical, social, obstetrical and family history.  Reviewed problem list, medications and allergies. Physical Assessment:   Vitals:   12/26/22 0836 12/26/22 0838  BP: (!) 134/94 119/78  Pulse: 96 76  Weight: 193 lb 3.2 oz (87.6 kg)   Body mass index is 31.66 kg/m.        Physical Examination:   General appearance: Well appearing, and in no distress  Mental status: Alert, oriented to person, place, and time  Skin: Warm & dry  Cardiovascular: Normal heart rate noted  Respiratory: Normal respiratory effort, no distress  Abdomen: Soft, gravid, nontender  Pelvic: Cervical exam deferred         Extremities: Edema: None  Fetal Status: Fetal Heart Rate (bpm): 142 Fundal Height: 30 cm Movement: Present    Chaperone: N/A   No results found for  this or any previous visit (from the past 24 hour(s)).  Assessment & Plan:  1) Low-risk pregnancy G2P1001 at [redacted]w[redacted]d with an Estimated Date of Delivery: 03/02/23   2) Recent spotting/clots and ?polyhydramnios, per informal b/s u/s in [place], will get formal u/s  3) Anemia> rx Fe every other day (sent results from [place] via MyChart)  4) 1st bp elevated> 2nd normal, no h/o HTN   Meds:  Meds ordered this encounter  Medications   ferrous sulfate 325 (65 FE) MG tablet    Sig: Take 1 tablet (325 mg total) by mouth every other day.    Dispense:  45 tablet    Refill:  2   Labs/procedures today: none  Plan:  Continue routine obstetrical care  Next visit: prefers in person    Reviewed: Preterm labor symptoms and general obstetric precautions including but not limited to vaginal bleeding, contractions, leaking of fluid and fetal movement were reviewed in detail with the patient.  All questions  were answered. Does have home bp cuff. Office bp cuff given: not applicable. Check bp weekly, let us know if consistently >140 and/or >90.  Follow-up: Return for asap u/s (Gbso if needed) for fluid/placenta; then as scheduled.  Future Appointments  Date Time Provider Department Center  01/09/2023  8:30 AM Cheral Marker, [place] [facility]  01/23/2023  8:30 AM Cheral Marker, CNM [facility]  02/06/2023  8:30 AM Cheral Marker, CNM [facility]    No orders of the defined types were placed in this encounter.  Cheral Marker CNM, [facility] 12/26/2022 9:05 AM

## 2022-12-26 NOTE — Patient Instructions (Signed)
Donna Mcknight, thank you for choosing our office today! We appreciate the opportunity to meet your healthcare needs. You may receive a short survey by mail, e-mail, or through [employer]. If you are happy with your care we would appreciate if you could take just a few minutes to complete the survey questions. We read all of your comments and take your feedback very seriously. Thank you again for choosing our office.  Center for [employer] Team at [facility]  [facility] ([address] [place], [place] [address]) Entrance C, located off of [address] Free 24/7 valet parking   CLASSES: Go to [email or web address] to register for classes (childbirth, breastfeeding, waterbirth, infant CPR, daddy bootcamp, etc.)  Call the office [phone]) or go to [facility] if: You begin to have strong, frequent contractions Your water breaks.  Sometimes it is a big gush of fluid, sometimes it is just a trickle that keeps getting your panties wet or running down your legs You have vaginal bleeding.  It is normal to have a small amount of spotting if your cervix was checked.  You don't feel your baby moving like normal.  If you don't, get you something to eat and drink and lay down and focus on feeling your baby move.   If your baby is still not moving like normal, you should call the office or go to [facility].  Call the office [phone]) or go to [facility] hospital for these signs of pre-eclampsia: Severe headache that does not go away with Tylenol Visual changes- seeing spots, double, blurred vision Pain under your right breast or upper abdomen that does not go away with Tums or heartburn medicine Nausea and/or vomiting Severe swelling in your hands, feet, and face   Tdap Vaccine It is recommended that you get the Tdap vaccine during the third trimester of EACH pregnancy to help protect your baby from getting pertussis (whooping cough) 27-36 weeks is the BEST time to do  this so that you can pass the protection on to your baby. During pregnancy is better than after pregnancy, but if you are unable to get it during pregnancy it will be offered at the hospital.  You can get this vaccine with [place], at the health department, your family doctor, or some local pharmacies Everyone who will be around your baby should also be up-to-date on their vaccines before the baby comes. Adults (who are not pregnant) only need 1 dose of Tdap during adulthood.   [place] Pediatricians/Family Doctors [facility]): [address] Colette Ribas, [phone]           [facility]: [address], [phone]                [facility]): [address], [phone] (call to ask if accepting patients) [facility] Department: [address], [place], [phone]    [facility] Pediatricians/Family Doctors [facility]): [phone], [phone] [facility]: [address] [place], [phone] [facility]: [address], [phone]  [facility] Doctors  [facility]): [phone] [facility]: [address] [phone]   [facility] Doctors [facility]: [address] [phone]  [facility] Family Doctors  [facility]: [phone], [phone]  Home Blood Pressure Monitoring for Patients   Your provider has recommended that you check your  blood pressure (BP) at least once a week at home. If you do not have a blood pressure cuff at home, one will be provided for you. Contact your provider if you have not received your monitor within 1 week.   Helpful Tips for Accurate Home Blood Pressure Checks  Don't smoke, exercise, or drink caffeine 30 minutes before checking your BP Use the restroom before checking your BP (a full bladder can raise your  pressure) Relax in a comfortable upright chair Feet on the ground Left arm resting comfortably on a flat surface at the level of your heart Legs uncrossed Back supported Sit quietly and don't talk Place the cuff on your bare arm Adjust snuggly, so that only two fingertips can fit between your skin and the top of the cuff Check 2 readings separated by at least one minute Keep a log of your BP readings For a visual, please reference this diagram: http://ccnc.care/bpdiagram  Provider Name: [facility]     Phone: [phone]  Zone 1: ALL CLEAR  Continue to monitor your symptoms:  BP reading is less than 140 (top number) or less than 90 (bottom number)  No right upper stomach pain No headaches or seeing spots No feeling nauseated or throwing up No swelling in face and hands  Zone 2: CAUTION Call your doctor's office for any of the following:  BP reading is greater than 140 (top number) or greater than 90 (bottom number)  Stomach pain under your ribs in the middle or right side Headaches or seeing spots Feeling nauseated or throwing up Swelling in face and hands  Zone 3: EMERGENCY  Seek immediate medical care if you have any of the following:  BP reading is greater than160 (top number) or greater than 110 (bottom number) Severe headaches not improving with Tylenol Serious difficulty catching your breath Any worsening symptoms from Zone 2   Third Trimester of Pregnancy The third trimester is from week 29 through week 42, months 7 through 9. The third trimester is a time when the fetus is growing rapidly. At the end of the ninth month, the fetus is about 20 inches in length and weighs 6-10 pounds.  BODY CHANGES Your body goes through many changes during pregnancy. The changes vary from woman to woman.  Your weight will continue to increase. You can expect to gain 25-35 pounds (11-16 kg) by the end of the pregnancy. You may begin to get stretch marks on your hips, abdomen,  and breasts. You may urinate more often because the fetus is moving lower into your pelvis and pressing on your bladder. You may develop or continue to have heartburn as a result of your pregnancy. You may develop constipation because certain hormones are causing the muscles that push waste through your intestines to slow down. You may develop hemorrhoids or swollen, bulging veins (varicose veins). You may have pelvic pain because of the weight gain and pregnancy hormones relaxing your joints between the bones in your pelvis. Backaches may result from overexertion of the muscles supporting your posture. You may have changes in your hair. These can include thickening of your hair, rapid growth, and changes in texture. Some women also have hair loss during or after pregnancy, or hair that feels dry or thin. Your hair will most likely return to normal after your baby is born. Your breasts will continue to grow and be tender. A yellow discharge may leak from your breasts called colostrum. Your belly button may stick out. You may  feel short of breath because of your expanding uterus. You may notice the fetus "dropping," or moving lower in your abdomen. You may have a bloody mucus discharge. This usually occurs a few days to a week before labor begins. Your cervix becomes thin and soft (effaced) near your due date. WHAT TO EXPECT AT YOUR PRENATAL EXAMS  You will have prenatal exams every 2 weeks until week 36. Then, you will have weekly prenatal exams. During a routine prenatal visit: You will be weighed to make sure you and the fetus are growing normally. Your blood pressure is taken. Your abdomen will be measured to track your baby's growth. The fetal heartbeat will be listened to. Any test results from the previous visit will be discussed. You may have a cervical check near your due date to see if you have effaced. At around 36 weeks, your caregiver will check your cervix. At the same time, your  caregiver will also perform a test on the secretions of the vaginal tissue. This test is to determine if a type of bacteria, Group B streptococcus, is present. Your caregiver will explain this further. Your caregiver may ask you: What your birth plan is. How you are feeling. If you are feeling the baby move. If you have had any abnormal symptoms, such as leaking fluid, bleeding, severe headaches, or abdominal cramping. If you have any questions. Other tests or screenings that may be performed during your third trimester include: Blood tests that check for low iron levels (anemia). Fetal testing to check the health, activity level, and growth of the fetus. Testing is done if you have certain medical conditions or if there are problems during the pregnancy. FALSE LABOR You may feel small, irregular contractions that eventually go away. These are called Braxton Hicks contractions, or false labor. Contractions may last for hours, days, or even weeks before true labor sets in. If contractions come at regular intervals, intensify, or become painful, it is best to be seen by your caregiver.  SIGNS OF LABOR  Menstrual-like cramps. Contractions that are 5 minutes apart or less. Contractions that start on the top of the uterus and spread down to the lower abdomen and back. A sense of increased pelvic pressure or back pain. A watery or bloody mucus discharge that comes from the vagina. If you have any of these signs before the 37th week of pregnancy, call your caregiver right away. You need to go to the hospital to get checked immediately. HOME CARE INSTRUCTIONS  Avoid all smoking, herbs, alcohol, and unprescribed drugs. These chemicals affect the formation and growth of the baby. Follow your caregiver's instructions regarding medicine use. There are medicines that are either safe or unsafe to take during pregnancy. Exercise only as directed by your caregiver. Experiencing uterine cramps is a good sign to  stop exercising. Continue to eat regular, healthy meals. Wear a good support bra for breast tenderness. Do not use hot tubs, steam rooms, or saunas. Wear your seat belt at all times when driving. Avoid raw meat, uncooked cheese, cat litter boxes, and soil used by cats. These carry germs that can cause birth defects in the baby. Take your prenatal vitamins. Try taking a stool softener (if your caregiver approves) if you develop constipation. Eat more high-fiber foods, such as fresh vegetables or fruit and whole grains. Drink plenty of fluids to keep your urine clear or pale yellow. Take warm sitz baths to soothe any pain or discomfort caused by hemorrhoids. Use hemorrhoid cream if  your caregiver approves. If you develop varicose veins, wear support hose. Elevate your feet for 15 minutes, 3-4 times a day. Limit salt in your diet. Avoid heavy lifting, wear low heal shoes, and practice good posture. Rest a lot with your legs elevated if you have leg cramps or low back pain. Visit your dentist if you have not gone during your pregnancy. Use a soft toothbrush to brush your teeth and be gentle when you floss. A sexual relationship may be continued unless your caregiver directs you otherwise. Do not travel far distances unless it is absolutely necessary and only with the approval of your caregiver. Take prenatal classes to understand, practice, and ask questions about the labor and delivery. Make a trial run to the hospital. Pack your hospital bag. Prepare the baby's nursery. Continue to go to all your prenatal visits as directed by your caregiver. SEEK MEDICAL CARE IF: You are unsure if you are in labor or if your water has broken. You have dizziness. You have mild pelvic cramps, pelvic pressure, or nagging pain in your abdominal area. You have persistent nausea, vomiting, or diarrhea. You have a bad smelling vaginal discharge. You have pain with urination. SEEK IMMEDIATE MEDICAL CARE IF:  You  have a fever. You are leaking fluid from your vagina. You have spotting or bleeding from your vagina. You have severe abdominal cramping or pain. You have rapid weight loss or gain. You have shortness of breath with chest pain. You notice sudden or extreme swelling of your face, hands, ankles, feet, or legs. You have not felt your baby move in over an hour. You have severe headaches that do not go away with medicine. You have vision changes. Document Released: 03/22/2001 Document Revised: 04/02/2013 Document Reviewed: 05/29/2012 [facility] Patient Information 2015 [place], [place]. This information is not intended to replace advice given to you by your health care provider. Make sure you discuss any questions you have with your health care provider.

## 2022-12-27 ENCOUNTER — Encounter: Payer: Self-pay | Admitting: Women's Health

## 2022-12-27 ENCOUNTER — Other Ambulatory Visit: Payer: Self-pay | Admitting: Women's Health

## 2022-12-27 MED ORDER — PANTOPRAZOLE SODIUM 20 MG PO TBEC: 20.0000 mg | DELAYED_RELEASE_TABLET | Freq: Every day | ORAL | 2 refills | Status: DC

## 2022-12-28 ENCOUNTER — Inpatient Hospital Stay (HOSPITAL_COMMUNITY)
Admission: AD | Admit: 2022-12-28 | Discharge: 2022-12-28 | Disposition: A | Discharge status: Home / Self Care | Payer: BC Managed Care – PPO | Attending: Obstetrics and Gynecology | Admitting: Obstetrics and Gynecology

## 2022-12-28 ENCOUNTER — Inpatient Hospital Stay (HOSPITAL_COMMUNITY): Payer: BC Managed Care – PPO

## 2022-12-28 ENCOUNTER — Encounter (HOSPITAL_COMMUNITY): Payer: Self-pay | Admitting: Obstetrics and Gynecology

## 2022-12-28 ENCOUNTER — Encounter: Payer: Self-pay | Admitting: Women's Health

## 2022-12-28 DIAGNOSIS — O09293 Supervision of pregnancy with other poor reproductive or obstetric history, third trimester: Secondary | ICD-10-CM | POA: Diagnosis not present

## 2022-12-28 DIAGNOSIS — O24113 Pre-existing diabetes mellitus, type 2, in pregnancy, third trimester: Secondary | ICD-10-CM | POA: Diagnosis not present

## 2022-12-28 DIAGNOSIS — O368130 Decreased fetal movements, third trimester, not applicable or unspecified: Secondary | ICD-10-CM | POA: Insufficient documentation

## 2022-12-28 DIAGNOSIS — O283 Abnormal ultrasonic finding on antenatal screening of mother: Secondary | ICD-10-CM | POA: Diagnosis not present

## 2022-12-28 DIAGNOSIS — Z3A30 30 weeks gestation of pregnancy: Secondary | ICD-10-CM

## 2022-12-28 DIAGNOSIS — Z87891 Personal history of nicotine dependence: Secondary | ICD-10-CM | POA: Diagnosis not present

## 2022-12-28 DIAGNOSIS — O4693 Antepartum hemorrhage, unspecified, third trimester: Secondary | ICD-10-CM | POA: Diagnosis not present

## 2022-12-28 NOTE — MAU Note (Signed)
.  Donna Mcknight is a 27 y.o. at [redacted]w[redacted]d here in MAU reporting: she was in [place] for a work trip last week when she started having some VB. She went to a hospital there and the doctor did a BSUS that he reported showing possible polyhydramnios and small clot near the placenta. She followed up with Grace Bushy, CNM in the office on 9/16 and was scheduled for a formal US, but was informed to come into MAU today for DFM.   Denies VB or LOF. Reports some FM, but less than normal.  Onset of complaint: On-going Pain score: Denies pain Vitals:   12/28/22 1524  BP: 117/69  Pulse: 88  Resp: 16  Temp: 99 F (37.2 C)     FHT:145 Lab orders placed from triage:  N/A

## 2022-12-28 NOTE — MAU Provider Note (Signed)
History     [id number]  Arrival date and time: 12/28/22 1453    Chief Complaint  Patient presents with   Pregnancy Ultrasound     HPI Donna Mcknight is a 27 y.o. at [redacted]w[redacted]d with PMHx notable for hx of GDM, PPH, anxiety, who presents for vaginal bleeding.   Patient reports she was on a work trip last week in [place] and had some vaginal bleeding that as quite heavy with clots She had done a lot of walking that day No recent intercourse prior to the bleeding She went to a local ED and had a bedside US that showed possible polyhydramnios and a placental clot She reports that since then she has had no bleeding, no leaking fluid, no contractions She felt some decreased fetal movement but this resolved while in MAU   O/Positive/-- (04/25 0839)  OB History     Gravida  2   Para  1   Term  1   Preterm  0   AB  0   Living  1      SAB  0   IAB  0   Ectopic  0   Multiple  0   Live Births  1           Past Medical History:  Diagnosis Date   Anemia    as teenager   Anxiety    Depression    Gestational diabetes    first preg   Mental disorder    anxiety, depression   UTI (urinary tract infection)     Past Surgical History:  Procedure Laterality Date   MOUTH SURGERY      Family History  Problem Relation Age of Onset   Cancer Maternal Grandmother    Diabetes Maternal Grandfather    Cirrhosis Father    Hypertension Mother    Fibroids Mother     Social History   Socioeconomic History   Marital status: Married    Spouse name: Not on file   Number of children: 0   Years of education: Not on file   Highest education level: Not on file  Occupational History   Not on file  Tobacco Use   Smoking status: Never   Smokeless tobacco: Never   Tobacco comments:    Quit 2014  Vaping Use   Vaping status: Former  Substance and Sexual Activity   Alcohol use: Not Currently    Alcohol/week: 10.0 standard drinks of alcohol    Types: 10  Glasses of wine per week    Comment: almost everyday   Drug use: Not Currently    Types: Marijuana    Comment: prior to preg   Sexual activity: Yes  Other Topics Concern   Not on file  Social History Narrative   Not on file   Social Determinants of Health   Financial Resource Strain: Low Risk  (06/30/2022)   Overall Financial Resource Strain (CARDIA)    Difficulty of Paying Living Expenses: Not hard at all  Food Insecurity: No Food Insecurity (06/30/2022)   Hunger Vital Sign    Worried About Running Out of Food in the Last Year: Never true    Ran Out of Food in the Last Year: Never true  Transportation Needs: No Transportation Needs (06/30/2022)   PRAPARE - Administrator, Civil Service (Medical): No    Lack of Transportation (Non-Medical): No  Physical Activity: Insufficiently Active (06/30/2022)   Exercise Vital Sign    Days of Exercise  per Week: 1 day    Minutes of Exercise per Session: 10 min  Stress: Stress Concern Present (06/30/2022)   [employer] of Occupational Health - Occupational Stress Questionnaire    Feeling of Stress : To some extent  Social Connections: Socially Isolated (06/30/2022)   Social Connection and Isolation Panel [NHANES]    Frequency of Communication with Friends and Family: Once a week    Frequency of Social Gatherings with Friends and Family: Never    Attends Religious Services: Never    Database administrator or Organizations: No    Attends Banker Meetings: Never    Marital Status: Married  Catering manager Violence: Not At Risk (06/30/2022)   Humiliation, Afraid, Rape, and Kick questionnaire    Fear of Current or Ex-Partner: No    Emotionally Abused: No    Physically Abused: No    Sexually Abused: No    Allergies  Allergen Reactions   Nickel     Other reaction(s): edema   Other Hives and Swelling    Nickel      No current facility-administered medications on file prior to encounter.   Current Outpatient  Medications on File Prior to Encounter  Medication Sig Dispense Refill   ferrous sulfate 325 (65 FE) MG tablet Take 1 tablet (325 mg total) by mouth every other day. 45 tablet 2   pantoprazole (PROTONIX) 20 MG tablet Take 1 tablet (20 mg total) by mouth daily. 30 tablet 2   Prenatal Vit-Fe Fumarate-FA (PRENATAL VITAMIN PO) Take by mouth.     acetaminophen (TYLENOL) 325 MG tablet Take 650 mg by mouth every 6 (six) hours as needed.       ROS Pertinent positives and negative per HPI, all others reviewed and negative  Physical Exam   BP 116/69   Pulse 86   Temp 99 F (37.2 C) (Oral)   Resp 16   LMP 05/26/2022 (Approximate)   Patient Vitals for the past 24 hrs:  BP Temp Temp src Pulse Resp  12/28/22 1531 116/69 -- -- 86 --  12/28/22 1524 117/69 99 F (37.2 C) Oral 88 16    Physical Exam Vitals reviewed.  Constitutional:      General: She is not in acute distress.    Appearance: She is well-developed. She is not diaphoretic.  Eyes:     General: No scleral icterus. Pulmonary:     Effort: Pulmonary effort is normal. No respiratory distress.  Skin:    General: Skin is warm and dry.  Neurological:     Mental Status: She is alert.     Coordination: Coordination normal.      Cervical Exam    Bedside Ultrasound Not performed.  My interpretation: n/a  FHT Baseline: 140 bpm Variability: Good {> 6 bpm) Accelerations: Reactive Decelerations: Absent Uterine activity: None Cat: I  Labs No results found for this or any previous visit (from the past 24 hour(s)).  Imaging No results found.  MAU Course  Procedures Lab Orders  No laboratory test(s) ordered today   No orders of the defined types were placed in this encounter.  Imaging Orders         [place] MFM FETAL BPP WO NON STRESS         [place] MFM OB Limited     MDM Moderate (Level 3-4)  Assessment and Plan  #Decreased fetal movement, Vaginal bleeding in pregnancy, third trimester #[redacted] weeks gestation of  pregnancy NST reactive and fetal movement returned to normal  while in MAU. Limited US unremarkable with BPP 8/8. Discussed with patient that overall testing is very reassuring. Possibilities for vaginal bleeding include cervical friability and abruption. We discussed that ultrasound is not totally sensitive for the diagnosis of abruption but the overall evidence points to reassuring findings. Discussed that if she has another episode of bleeding she should return immediately. Should also return for loss of fluid, DFM, contractions.   #FWB FHT Cat I NST: Reactive   Dispo: discharged to home in stable condition    Venora Maples, MD/MPH 12/28/22 5:00 PM  Allergies as of 12/28/2022       Reactions   Nickel    Other reaction(s): edema   Other Hives, Swelling   Nickel        Medication List     TAKE these medications    acetaminophen 325 MG tablet Commonly known as: TYLENOL Take 650 mg by mouth every 6 (six) hours as needed.   ferrous sulfate 325 (65 FE) MG tablet Take 1 tablet (325 mg total) by mouth every other day.   pantoprazole 20 MG tablet Commonly known as: Protonix Take 1 tablet (20 mg total) by mouth daily.   PRENATAL VITAMIN PO Take by mouth.

## 2023-01-02 ENCOUNTER — Encounter: Payer: Self-pay | Admitting: Women's Health

## 2023-01-03 ENCOUNTER — Other Ambulatory Visit: Payer: Self-pay | Admitting: Women's Health

## 2023-01-03 MED ORDER — OMEPRAZOLE 40 MG PO CPDR: 40.0000 mg | DELAYED_RELEASE_CAPSULE | Freq: Every day | ORAL | 2 refills | Status: DC

## 2023-01-06 ENCOUNTER — Encounter: Payer: BC Managed Care – PPO | Admitting: Obstetrics & Gynecology

## 2023-01-06 ENCOUNTER — Other Ambulatory Visit: Payer: BC Managed Care – PPO | Admitting: Radiology

## 2023-01-09 ENCOUNTER — Ambulatory Visit (INDEPENDENT_AMBULATORY_CARE_PROVIDER_SITE_OTHER): Payer: BC Managed Care – PPO | Admitting: Women's Health

## 2023-01-09 ENCOUNTER — Other Ambulatory Visit: Payer: Self-pay

## 2023-01-09 ENCOUNTER — Encounter: Payer: Self-pay | Admitting: Women's Health

## 2023-01-09 ENCOUNTER — Observation Stay (HOSPITAL_COMMUNITY)
Admission: AD | Admit: 2023-01-09 | Discharge: 2023-01-11 | Disposition: A | Discharge status: Home / Self Care | Payer: BC Managed Care – PPO | Attending: Obstetrics & Gynecology | Admitting: Obstetrics & Gynecology

## 2023-01-09 ENCOUNTER — Inpatient Hospital Stay (HOSPITAL_COMMUNITY): Payer: BC Managed Care – PPO

## 2023-01-09 ENCOUNTER — Encounter (HOSPITAL_COMMUNITY): Payer: Self-pay | Admitting: Obstetrics & Gynecology

## 2023-01-09 VITALS — BP 111/73 | HR 72 | Wt 195.0 lb

## 2023-01-09 DIAGNOSIS — Z3483 Encounter for supervision of other normal pregnancy, third trimester: Secondary | ICD-10-CM | POA: Diagnosis not present

## 2023-01-09 DIAGNOSIS — N930 Postcoital and contact bleeding: Secondary | ICD-10-CM | POA: Diagnosis not present

## 2023-01-09 DIAGNOSIS — O4693 Antepartum hemorrhage, unspecified, third trimester: Principal | ICD-10-CM | POA: Insufficient documentation

## 2023-01-09 DIAGNOSIS — Z3A32 32 weeks gestation of pregnancy: Secondary | ICD-10-CM | POA: Insufficient documentation

## 2023-01-09 DIAGNOSIS — O09293 Supervision of pregnancy with other poor reproductive or obstetric history, third trimester: Secondary | ICD-10-CM

## 2023-01-09 DIAGNOSIS — Z8632 Personal history of gestational diabetes: Secondary | ICD-10-CM

## 2023-01-09 DIAGNOSIS — Z79899 Other long term (current) drug therapy: Secondary | ICD-10-CM | POA: Diagnosis not present

## 2023-01-09 HISTORY — DX: Antepartum hemorrhage, unspecified, third trimester: O46.93

## 2023-01-09 LAB — CBC
HCT: 34.3 % — ABNORMAL LOW (ref 36.0–46.0)
Hemoglobin: 11.3 g/dL — ABNORMAL LOW (ref 12.0–15.0)
MCH: 29.2 pg (ref 26.0–34.0)
MCHC: 32.9 g/dL (ref 30.0–36.0)
MCV: 88.6 fL (ref 80.0–100.0)
Platelets: 268 10*3/uL (ref 150–400)
RBC: 3.87 MIL/uL (ref 3.87–5.11)
RDW: 13.7 % (ref 11.5–15.5)
WBC: 9.4 10*3/uL (ref 4.0–10.5)
nRBC: 0 % (ref 0.0–0.2)

## 2023-01-09 LAB — WET PREP, GENITAL
Clue Cells Wet Prep HPF POC: NONE SEEN
Sperm: NONE SEEN
Trich, Wet Prep: NONE SEEN
WBC, Wet Prep HPF POC: <10 (ref <10)
Yeast Wet Prep HPF POC: NONE SEEN

## 2023-01-09 LAB — TYPE AND SCREEN
ABO/RH(D): O POS
Antibody Screen: NEGATIVE

## 2023-01-09 MED ORDER — PANTOPRAZOLE SODIUM 40 MG PO TBEC
40.0000 mg | DELAYED_RELEASE_TABLET | Freq: Every day | ORAL | Status: DC
  Administered 2023-01-11: 40 mg via ORAL
  Filled 2023-01-09: qty 1

## 2023-01-09 MED ORDER — PRENATAL VITAMIN 27-0.8 MG PO TABS: 1.0000 | ORAL_TABLET | Freq: Every day | ORAL | Status: DC

## 2023-01-09 MED ORDER — LACTATED RINGERS IV SOLN: 125.0000 mL/h | INTRAVENOUS | Status: AC

## 2023-01-09 MED ORDER — BETAMETHASONE SOD PHOS & ACET 6 (3-3) MG/ML IJ SUSP
12.0000 mg | INTRAMUSCULAR | Status: AC
  Administered 2023-01-09 – 2023-01-10 (×2): 12 mg via INTRAMUSCULAR
  Filled 2023-01-09: qty 5

## 2023-01-09 MED ORDER — ZOLPIDEM TARTRATE 5 MG PO TABS: 5.0000 mg | ORAL_TABLET | Freq: Every evening | ORAL | Status: DC | PRN

## 2023-01-09 MED ORDER — PRENATAL MULTIVITAMIN CH: 1.0000 | ORAL_TABLET | Freq: Every day | ORAL | Status: DC

## 2023-01-09 MED ORDER — DOCUSATE SODIUM 100 MG PO CAPS
100.0000 mg | ORAL_CAPSULE | Freq: Every day | ORAL | Status: DC
  Administered 2023-01-09 – 2023-01-11 (×2): 100 mg via ORAL
  Filled 2023-01-09 (×2): qty 1

## 2023-01-09 MED ORDER — TERBUTALINE SULFATE 1 MG/ML IJ SOLN: 0.2500 mg | Freq: Once | INTRAMUSCULAR | Status: DC | PRN

## 2023-01-09 MED ORDER — FERROUS SULFATE 325 (65 FE) MG PO TABS
325.0000 mg | ORAL_TABLET | ORAL | Status: DC
  Administered 2023-01-11: 325 mg via ORAL
  Filled 2023-01-09: qty 1

## 2023-01-09 MED ORDER — CALCIUM CARBONATE ANTACID 500 MG PO CHEW: 2.0000 | CHEWABLE_TABLET | ORAL | Status: DC | PRN

## 2023-01-09 MED ORDER — ACETAMINOPHEN 325 MG PO TABS: 650.0000 mg | ORAL_TABLET | ORAL | Status: DC | PRN

## 2023-01-09 NOTE — Consult Note (Signed)
[facility]  Prenatal Consult       01/09/2023  4:22 PM   I was asked by Dr. Macon Large to consult on this patient for possible/anticipated 32 week preterm delivery. I had the pleasure of meeting with Donna Mcknight today. She is a 27 yo. Pregnancy complicated by vaginal bleeding. She is expecting a baby girl.   I explained that the neonatal intensive care team would be present for the delivery and outlined the likely delivery room course for this baby including routine resuscitation and NRP-guided approaches to the treatment of respiratory distress. We discussed other common problems associated with prematurity including respiratory distress syndrome/CLD, apnea, feeding issues, temperature regulation, and infection risk. We briefly discussed IVH/PVL, ROP, and NEC and that these are complications associated with prematurity, but that by 30 weeks are uncommon.   We discussed the average length of stay but I noted that the actual LOS would depend on the severity of problems encountered and response to treatments. We discussed visitation policies and the resources available while her child is in the hospital.  We discussed the importance of good nutrition and various methods of providing nutrition (parenteral hyperalimentation, gavage feedings and/or oral feeding). We discussed the benefits of human milk. I encouraged breast feeding and pumping soon after birth and outlined resources that are available to support breast feeding. We discussed the possibility of using donor breast milk as a bridge. Mother does not plan to breast feed.  Thank you for involving [place] in the care of this patient. A member of our team will be available should the family have additional questions.  Time for consultation: approximately 20 minutes of face-to-face time in discussion of the risks and medical care associated with preterm delivery.   Servando Salina, MD Attending Neonatologist

## 2023-01-09 NOTE — Patient Instructions (Addendum)
Donna Mcknight, thank you for choosing our office today! We appreciate the opportunity to meet your healthcare needs. You may receive a short survey by mail, e-mail, or through [employer]. If you are happy with your care we would appreciate if you could take just a few minutes to complete the survey questions. We read all of your comments and take your feedback very seriously. Thank you again for choosing our office.  Center for [employer] Team at [facility]  [facility] at [facility] ([address] [place], [place] [address]) Entrance C, located off of [address] Free 24/7 valet parking   CLASSES: Go to [email or web address] to register for classes (childbirth, breastfeeding, waterbirth, infant CPR, daddy bootcamp, etc.)  Call the office [phone]) or go to [facility] if: You begin to have strong, frequent contractions Your water breaks.  Sometimes it is a big gush of fluid, sometimes it is just a trickle that keeps getting your panties wet or running down your legs You have vaginal bleeding.  It is normal to have a small amount of spotting if your cervix was checked.  You don't feel your baby moving like normal.  If you don't, get you something to eat and drink and lay down and focus on feeling your baby move.   If your baby is still not moving like normal, you should call the office or go to [facility].  Call the office [phone]) or go to [facility] hospital for these signs of pre-eclampsia: Severe headache that does not go away with Tylenol Visual changes- seeing spots, double, blurred vision Pain under your right breast or upper abdomen that does not go away with Tums or heartburn medicine Nausea and/or vomiting Severe swelling in your hands, feet, and face   Tdap Vaccine It is recommended that you get the Tdap vaccine during the third trimester of EACH pregnancy to help protect your baby from getting pertussis (whooping cough) 27-36 weeks is the BEST time to do  this so that you can pass the protection on to your baby. During pregnancy is better than after pregnancy, but if you are unable to get it during pregnancy it will be offered at the hospital.  You can get this vaccine with [place], at the health department, your family doctor, or some local pharmacies Everyone who will be around your baby should also be up-to-date on their vaccines before the baby comes. Adults (who are not pregnant) only need 1 dose of Tdap during adulthood.   [facility] Pediatricians/Family Doctors [facility]): [address] Colette Ribas, [phone]           [facility]: [address], [phone]                [facility]): [address], [phone] (call to ask if accepting patients) [facility]: [address], [place], [phone]    [facility] Pediatricians/Family Doctors [facility]): [phone], [phone] [facility]: [address] [place], [phone] [facility]: [address], [phone]  [facility] Doctors  [facility]): [phone] [facility]: [address], [phone]   [facility] Doctors [facility]: [address], [phone]  [facility] Family Doctors  [facility]: [phone], [phone]  Home Blood Pressure Monitoring for Patients   Your provider has recommended that you check your  blood pressure (BP) at least once a week at home. If you do not have a blood pressure cuff at home, one will be provided for you. Contact your provider if you have not received your monitor within 1 week.   Helpful Tips for Accurate Home Blood Pressure Checks  Don't smoke, exercise, or drink caffeine 30 minutes before checking your BP Use the restroom before checking your BP (a full bladder can raise your  pressure) Relax in a comfortable upright chair Feet on the ground Left arm resting comfortably on a flat surface at the level of your heart Legs uncrossed Back supported Sit quietly and don't talk Place the cuff on your bare arm Adjust snuggly, so that only two fingertips can fit between your skin and the top of the cuff Check 2 readings separated by at least one minute Keep a log of your BP readings For a visual, please reference this diagram: http://ccnc.care/bpdiagram  Provider Name: [facility]     Phone: [phone]  Zone 1: ALL CLEAR  Continue to monitor your symptoms:  BP reading is less than 140 (top number) or less than 90 (bottom number)  No right upper stomach pain No headaches or seeing spots No feeling nauseated or throwing up No swelling in face and hands  Zone 2: CAUTION Call your doctor's office for any of the following:  BP reading is greater than 140 (top number) or greater than 90 (bottom number)  Stomach pain under your ribs in the middle or right side Headaches or seeing spots Feeling nauseated or throwing up Swelling in face and hands  Zone 3: EMERGENCY  Seek immediate medical care if you have any of the following:  BP reading is greater than160 (top number) or greater than 110 (bottom number) Severe headaches not improving with Tylenol Serious difficulty catching your breath Any worsening symptoms from Zone 2  Preterm Labor and Birth Information  The normal length of a pregnancy is 39-41 weeks. Preterm labor is when labor starts before 37 completed weeks of pregnancy. What are the risk factors for preterm labor? Preterm labor is more likely to occur in women who: Have certain infections during pregnancy such as a bladder infection, sexually transmitted infection, or infection inside the uterus (chorioamnionitis). Have a shorter-than-normal cervix. Have gone into preterm labor before. Have had surgery on their cervix. Are younger than age 17  or older than age 35. Are African American. Are pregnant with twins or multiple babies (multiple gestation). Take street drugs or smoke while pregnant. Do not gain enough weight while pregnant. Became pregnant shortly after having been pregnant. What are the symptoms of preterm labor? Symptoms of preterm labor include: Cramps similar to those that can happen during a menstrual period. The cramps may happen with diarrhea. Pain in the abdomen or lower back. Regular uterine contractions that may feel like tightening of the abdomen. A feeling of increased pressure in the pelvis. Increased watery or bloody mucus discharge from the vagina. Water breaking (ruptured amniotic sac). Why is it important to recognize signs of preterm labor? It is important to recognize signs of preterm labor because babies who are born prematurely may not be fully developed. This can put them at an increased risk for: Long-term (chronic) heart and lung problems. Difficulty immediately after birth with regulating body systems, including blood sugar, body temperature, heart rate, and breathing rate. Bleeding in the brain. Cerebral palsy. Learning difficulties. Death. These risks are highest for babies who are born before 34 weeks   of pregnancy. How is preterm labor treated? Treatment depends on the length of your pregnancy, your condition, and the health of your baby. It may involve: Having a stitch (suture) placed in your cervix to prevent your cervix from opening too early (cerclage). Taking or being given medicines, such as: Hormone medicines. These may be given early in pregnancy to help support the pregnancy. Medicine to stop contractions. Medicines to help mature the baby's lungs. These may be prescribed if the risk of delivery is high. Medicines to prevent your baby from developing cerebral palsy. If the labor happens before 34 weeks of pregnancy, you may need to stay in the hospital. What should I do if I  think I am in preterm labor? If you think that you are going into preterm labor, call your health care provider right away. How can I prevent preterm labor in future pregnancies? To increase your chance of having a full-term pregnancy: Do not use any tobacco products, such as cigarettes, chewing tobacco, and e-cigarettes. If you need help quitting, ask your health care provider. Do not use street drugs or medicines that have not been prescribed to you during your pregnancy. Talk with your health care provider before taking any herbal supplements, even if you have been taking them regularly. Make sure you gain a healthy amount of weight during your pregnancy. Watch for infection. If you think that you might have an infection, get it checked right away. Make sure to tell your health care provider if you have gone into preterm labor before. This information is not intended to replace advice given to you by your health care provider. Make sure you discuss any questions you have with your health care provider. Document Revised: 07/20/2018 Document Reviewed: 08/19/2015 Elsevier Patient Education  2020 Elsevier Inc.   

## 2023-01-09 NOTE — Progress Notes (Signed)
LOW-RISK PREGNANCY VISIT Patient name: Donna Mcknight MRN [id number]  Date of birth: 11-25-1995 Chief Complaint:   Routine Prenatal Visit  History of Present Illness:   Donna Mcknight is a 27 y.o. G51P1001 female at [redacted]w[redacted]d with an Estimated Date of Delivery: 03/02/23 being seen today for ongoing management of a low-risk pregnancy.   Today she reports  bright red bleeding after sex last night, was more than just spotting, continued through the night, now brownish-red, but still more than spotting . Denies abnormal discharge, itching/odor/irritation. Some cramping last night, none now. Baby moving, no hard kicks, just rolls- but is normal for her in morning. Had brb 3wks ago after flight to [place], had informal b/s u/s that showed ?poly and ?clot beside placenta. Had u/s in MAU 9/18 w/ normal AFI and no obvious abruption. Contractions: Not present. Vag. Bleeding: Small.  Movement: Present. denies leaking of fluid.     08/25/2022    9:46 AM 06/30/2022    8:46 AM 08/23/2021    8:38 AM 06/21/2021    4:28 PM 01/08/2021    9:07 AM  Depression screen PHQ 2/9  Decreased Interest 0 1 0 1 0  Down, Depressed, Hopeless 0 1 0 1 0  PHQ - 2 Score 0 2 0 2 0  Altered sleeping 3 0 0 1 0  Tired, decreased energy 1 3 1 3 2   Change in appetite 3 2 0 0 1  Feeling bad or failure about yourself  0 1 0 1 0  Trouble concentrating 0 1 0 1 0  Moving slowly or fidgety/restless 0 0 0 0 0  Suicidal thoughts 0 1 0 0 0  PHQ-9 Score 7 10 1 8 3         08/25/2022    9:46 AM 06/30/2022    8:46 AM 08/23/2021    8:54 AM 06/21/2021    4:30 PM  GAD 7 : Generalized Anxiety Score  Nervous, Anxious, on Edge 2 1 1 1   Control/stop worrying 1 2 1 1   Worry too much - different things 1 1 0 1  Trouble relaxing 1 1 1  0  Restless 0 0 0 0  Easily annoyed or irritable 2 1 1 1   Afraid - awful might happen 2 0 1 0  Total GAD 7 Score 9 6 5 4       Review of Systems:   Pertinent items are noted in HPI Denies  abnormal vaginal discharge w/ itching/odor/irritation, headaches, visual changes, shortness of breath, chest pain, abdominal pain, severe nausea/vomiting, or problems with urination or bowel movements unless otherwise stated above. Pertinent History Reviewed:  Reviewed past medical,surgical, social, obstetrical and family history.  Reviewed problem list, medications and allergies. Physical Assessment:   Vitals:   01/09/23 0836  BP: 111/73  Pulse: 72  Weight: 195 lb (88.5 kg)  Body mass index is 31.96 kg/m.        Physical Examination:   General appearance: Well appearing, and in no distress  Mental status: Alert, oriented to person, place, and time  Skin: Warm & dry  Cardiovascular: Normal heart rate noted  Respiratory: Normal respiratory effort, no distress  Abdomen: Soft, gravid, nontender  Pelvic:  brownish-red blood on thighs, spec exam- vag vault cleared of mod amt brownish-red blood, cx appears long & closed, CV swab obtained          Extremities:    Fetal Status:     Movement: Present   NST: FHR baseline 135 bpm,  Variability: moderate, Accelerations:present, Decelerations:  Absent= Cat 1/reactive Toco: 1 UC   Chaperone: Faith Rogue   No results found for this or any previous visit (from the past 24 hour(s)).  Assessment & Plan:  1) Low-risk pregnancy G2P1001 at [redacted]w[redacted]d with an Estimated Date of Delivery: 03/02/23   2) Postcoital bleeding, mod amt continues, discussed w/ Dr. Macon Large (2nd attending)- to come to MAU for further monitoring. Note routed to Dr. Earlene Plater. NST reactive   Meds: No orders of the defined types were placed in this encounter.  Labs/procedures today: spec exam and NST  Plan:  Continue routine obstetrical care  Next visit: prefers in person    Reviewed: Preterm labor symptoms and general obstetric precautions including but not limited to vaginal bleeding, contractions, leaking of fluid and fetal movement were reviewed in detail with the patient.  All  questions were answered. Does have home bp cuff. Office bp cuff given: not applicable. Check bp weekly, let us know if consistently >140 and/or >90.  Follow-up: Return for As scheduled.  Future Appointments  Date Time Provider Department Center  01/23/2023  8:30 AM Cheral Marker, CNM [facility]  02/06/2023  8:30 AM Cheral Marker, CNM [facility]  02/13/2023  8:30 AM Cheral Marker, CNM [facility]  02/20/2023  8:30 AM Jacklyn Shell, CNM [facility]  02/27/2023  8:50 AM Cheral Marker, CNM [facility]  03/06/2023  8:50 AM [facility] NURSE [facility]  03/06/2023  9:10 AM Myna Hidalgo, DO [facility]    No orders of the defined types were placed in this encounter.  Cheral Marker CNM, [facility] 01/09/2023 9:34 AM

## 2023-01-09 NOTE — MAU Note (Signed)
Donna Mcknight is a 27 y.o. at [redacted]w[redacted]d here in MAU reporting: sent from [facility] office due to excess vaginal bleeding. Pt stated she had sex last night around 2030 and light bleeding started afterwards. Pt states the bleeding is light to moderate now. Pt was examined in OB office with speculum and was told there was more bleeding than normal so she was sent to MAU. Pt reports lower abdominal cramping. Denies any LOF and reports +FM.    Onset of complaint: last night  Pain score: 2 Vitals:   01/09/23 1040  BP: 125/69  Pulse: 82  Resp: 16  Temp: 97.6 F (36.4 C)  SpO2: 97%     FHT:138 Lab orders placed from triage:

## 2023-01-09 NOTE — MAU Provider Note (Signed)
History     CSN: [id number]  Arrival date and time: 01/09/23 1020   Event Date/Time   First Provider Initiated Contact with Patient 01/09/23 1047      Chief Complaint  Patient presents with   Vaginal Bleeding   Vaginal Bleeding    Patient presents from office. Seen for regularly scheduled prenatal visit. Had intercourse last night and had some bright red spotting after. Has continued to bleed with dark red/brown blood this AM. No clots. Sent by Joellyn Haff. On her exam in office, found moderate amount of brown-red blood which had to be cleaned out with swab per Kim's note. No change in vaginal discharge, itching, foul odor, LOF. No contractions or cramping. +FM.  Did have VB on trip to [place]. Was told she had a clot on her placenta but on U/S here had resolved.  Past Medical History:  Diagnosis Date   Anemia    as teenager   Anxiety    Depression    Gestational diabetes    first preg   Mental disorder    anxiety, depression   UTI (urinary tract infection)     Past Surgical History:  Procedure Laterality Date   MOUTH SURGERY      Family History  Problem Relation Age of Onset   Cancer Maternal Grandmother    Diabetes Maternal Grandfather    Cirrhosis Father    Hypertension Mother    Fibroids Mother     Social History   Tobacco Use   Smoking status: Never   Smokeless tobacco: Never   Tobacco comments:    Quit 2014  Vaping Use   Vaping status: Former  Substance Use Topics   Alcohol use: Not Currently    Alcohol/week: 10.0 standard drinks of alcohol    Types: 10 Glasses of wine per week    Comment: almost everyday   Drug use: Not Currently    Comment: prior to preg    Allergies:  Allergies  Allergen Reactions   Nickel     Other reaction(s): edema   Other Hives and Swelling    Nickel      Medications Prior to Admission  Medication Sig Dispense Refill Last Dose   acetaminophen (TYLENOL) 325 MG tablet Take 650 mg by mouth every 6 (six) hours as  needed.   Past Month   ferrous sulfate 325 (65 FE) MG tablet Take 1 tablet (325 mg total) by mouth every other day. 45 tablet 2 01/09/2023   omeprazole (PRILOSEC) 40 MG capsule Take 1 capsule (40 mg total) by mouth daily. 30 capsule 2 01/09/2023   Prenatal Vit-Fe Fumarate-FA (PRENATAL VITAMIN PO) Take by mouth.   01/09/2023    Review of Systems  Genitourinary:  Positive for vaginal bleeding.    See HPI.  Physical Exam   Blood pressure 126/74, pulse 83, temperature 97.6 F (36.4 C), temperature source Oral, resp. rate 16, last menstrual period 05/26/2022, SpO2 97%, not currently breastfeeding.  Physical Exam Constitutional:      General: She is not in acute distress.    Appearance: She is not ill-appearing.  Cardiovascular:     Rate and Rhythm: Normal rate and regular rhythm.  Pulmonary:     Effort: Pulmonary effort is normal.  Abdominal:     Tenderness: There is no abdominal tenderness.     Comments: Gravid, non-tender  Genitourinary:    Comments: SSE showed small to moderate amount of brownish-red blood. Visible at cervical os. No pooling, other abnormal discharge Musculoskeletal:  General: No swelling.  Skin:    General: Skin is warm and dry.  Neurological:     Mental Status: Mental status is at baseline.  Psychiatric:        Mood and Affect: Mood normal.    Results for orders placed or performed during the hospital encounter of 01/09/23 (from the past 24 hour(s))  Wet prep, genital     Status: None   Collection Time: 01/09/23 11:05 AM  Result Value Ref Range   Yeast Wet Prep HPF POC NONE SEEN NONE SEEN   Trich, Wet Prep NONE SEEN NONE SEEN   Clue Cells Wet Prep HPF POC NONE SEEN NONE SEEN   WBC, Wet Prep HPF POC <10 <10   Sperm NONE SEEN   Type and screen [facility]     Status: None   Collection Time: 01/09/23 12:29 PM  Result Value Ref Range   ABO/RH(D) O POS    Antibody Screen NEG    Sample Expiration      01/12/2023,2359 Performed at  [facility], [address] [place], [place] [address]   CBC     Status: Abnormal   Collection Time: 01/09/23 12:29 PM  Result Value Ref Range   WBC 9.4 4.0 - 10.5 K/uL   RBC 3.87 3.87 - 5.11 MIL/uL   Hemoglobin 11.3 (L) 12.0 - 15.0 g/dL   HCT 60.4 (L) 54.0 - 98.1 %   MCV 88.6 80.0 - 100.0 fL   MCH 29.2 26.0 - 34.0 pg   MCHC 32.9 30.0 - 36.0 g/dL   RDW 19.1 47.8 - 29.5 %   Platelets 268 150 - 400 K/uL   nRBC 0.0 0.0 - 0.2 %     MAU Course  Procedures  MDM Patient with third trimester moderate vaginal bleeding. She has had continued bleeding since exam just a few hours ago in office. Wet mount obtained and was negative. GC/C pending. Repeat U/S pending at time of admission. Decision made to admit for further monitoring on antepartum.  Assessment and Plan   #Vaginal bleeding third trimester: admit for further monitoring and BMZ.  Joanne Gavel 01/09/2023, 1:51 PM

## 2023-01-09 NOTE — H&P (Signed)
Collection Time: 01/09/23 12:20 PM  Result Value Ref Range   ABO/RH(D) PENDING    Antibody Screen PENDING    Sample Expiration      01/12/2023,2359 Performed at [facility], [address] [place], [place] [address]     Imaging Studies: 01/09/23: No previa/abruption, nml AFV, BPP 8/8, BREECH 01/09/23:  [place] MFM FETAL BPP WO NON STRESS  Result Date: 12/29/2022 ----------------------------------------------------------------------  OBSTETRICS REPORT                        (Signed Final 12/29/2022 02:22 pm) ---------------------------------------------------------------------- Patient Info  ID #:       [id number]                          D.O.B.:  1996/03/24 (27 yrs)  Name:       Donna JUPITER-              Visit Date: 12/28/2022 03:34 pm              [facility] ---------------------------------------------------------------------- Performed By  Attending:        Braxton Feathers DO       Referred By:       [facility] MAU/Triage  Performed By:     Percell Boston          [facility] ---------------------------------------------------------------------- Orders  #  Description                           Code        Ordered By  1  [place] MFM FETAL BPP WO NON               76819.01    MATTHEW ECKSTAT     STRESS  2  [place] MFM OB LIMITED                     [id number]    MATTHEW ECKSTAT  ----------------------------------------------------------------------  #  Order #                     Accession #                Episode #  1  [id number]                   [id number]                 [id number]  2  [id number]                   [id number]                 [id number] ---------------------------------------------------------------------- Indications  Poor obstetric history: Previous gestational    O09.299  diabetes  Decreased fetal movements, third trimester,     O36.8130  unspecified  Encounter for antenatal screening,              Z36.9  unspecified  Abnormal fetal ultrasound (in [place] last week,  sided  Thoracic:              Appears normal         Kidneys:                Appear normal  Heart:                 Visualized             Bladder:                Appears normal ---------------------------------------------------------------------- Cervix Uterus Adnexa  Cervix  Not visualized (advanced GA >24wks)  Uterus  No abnormality visualized.  Right Ovary  Not visualized.  Left Ovary  Not visualized.  Cul De Sac  No free fluid seen.  Adnexa  No abnormality visualized ---------------------------------------------------------------------- Comments  Hospital Ultrasound  30w 6d at the [facility]U for decreased FM and a history of a  "blood clot" by the placenta. EDD: 03/02/2023 by LMP  (05/26/22).  Sonographic findings  Single intrauterine pregnancy.  Fetal cardiac activity: Observed and appears normal.  Presentation: Cephalic.  Limited fetal anatomy appears normal.  Amniotic fluid volume: Within normal limits. AFI: 10.7 cm.  MVP: 3.9 cm.  Placenta: Posterior. There is no sonographic evidence of  bleeding.  BPP: 8/8.  Recommendations  - While there is no sonogrpahic evidence of placental  bleeding, placental abruption is a clinical diagnosis and  ultrasound findings of placental beeding are seen in less than  25% of cases  - Continue clinical management per OB provider  - Consider serial growth [place] and weekly antenatal testing  around 32 weeks if the is concern for a chronic abruption.  This was a limited ultrasound with a remote read. If an official  MFM consult is requested for any reason please call/place an  order in Epic.  ----------------------------------------------------------------------                  Braxton Feathers, DO Electronically Signed Final Report   12/29/2022 02:22 pm ----------------------------------------------------------------------   [place] MFM OB Limited  Result Date: 12/29/2022 ----------------------------------------------------------------------  OBSTETRICS REPORT                        (Signed Final 12/29/2022 02:22 pm) ---------------------------------------------------------------------- Patient Info  ID #:       [id number]                          D.O.B.:  [birth year] (27 yrs)  Name:       Donna HORKEY-              Visit Date: 12/28/2022 03:34 pm              [facility] ---------------------------------------------------------------------- Performed By  Attending:        Braxton Feathers DO       Referred By:       [facility] MAU/Triage  Performed By:     Percell Boston          [facility] ---------------------------------------------------------------------- Orders  #  Collection Time: 01/09/23 12:20 PM  Result Value Ref Range   ABO/RH(D) PENDING    Antibody Screen PENDING    Sample Expiration      01/12/2023,2359 Performed at [facility], [address] [place], [place] [address]     Imaging Studies: 01/09/23: No previa/abruption, nml AFV, BPP 8/8, BREECH 01/09/23:  [place] MFM FETAL BPP WO NON STRESS  Result Date: 12/29/2022 ----------------------------------------------------------------------  OBSTETRICS REPORT                        (Signed Final 12/29/2022 02:22 pm) ---------------------------------------------------------------------- Patient Info  ID #:       [id number]                          D.O.B.:  1996/03/24 (27 yrs)  Name:       Donna JUPITER-              Visit Date: 12/28/2022 03:34 pm              [facility] ---------------------------------------------------------------------- Performed By  Attending:        Braxton Feathers DO       Referred By:       [facility] MAU/Triage  Performed By:     Percell Boston          [facility] ---------------------------------------------------------------------- Orders  #  Description                           Code        Ordered By  1  [place] MFM FETAL BPP WO NON               76819.01    MATTHEW ECKSTAT     STRESS  2  [place] MFM OB LIMITED                     [id number]    MATTHEW ECKSTAT  ----------------------------------------------------------------------  #  Order #                     Accession #                Episode #  1  [id number]                   [id number]                 [id number]  2  [id number]                   [id number]                 [id number] ---------------------------------------------------------------------- Indications  Poor obstetric history: Previous gestational    O09.299  diabetes  Decreased fetal movements, third trimester,     O36.8130  unspecified  Encounter for antenatal screening,              Z36.9  unspecified  Abnormal fetal ultrasound (in [place] last week,  sided  Thoracic:              Appears normal         Kidneys:                Appear normal  Heart:                 Visualized             Bladder:                Appears normal ---------------------------------------------------------------------- Cervix Uterus Adnexa  Cervix  Not visualized (advanced GA >24wks)  Uterus  No abnormality visualized.  Right Ovary  Not visualized.  Left Ovary  Not visualized.  Cul De Sac  No free fluid seen.  Adnexa  No abnormality visualized ---------------------------------------------------------------------- Comments  Hospital Ultrasound  30w 6d at the MAU for decreased FM and a history of a  "blood clot" by the placenta. EDD: 03/02/2023 by LMP  (05/26/22).  Sonographic findings  Single intrauterine pregnancy.  Fetal cardiac activity: Observed and appears normal.  Presentation: Cephalic.  Limited fetal anatomy appears normal.  Amniotic fluid volume: Within normal limits. AFI: 10.7 cm.  MVP: 3.9 cm.  Placenta: Posterior. There is no sonographic evidence of  bleeding.  BPP: 8/8.  Recommendations  - While there is no sonogrpahic evidence of placental  bleeding, placental abruption is a clinical diagnosis and  ultrasound findings of placental beeding are seen in less than  25% of cases  - Continue clinical management per OB provider  - Consider serial growth [place] and weekly antenatal testing  around 32 weeks if the is concern for a chronic abruption.  This was a limited ultrasound with a remote read. If an official  MFM consult is requested for any reason please call/place an  order in Epic.  ----------------------------------------------------------------------                  Braxton Feathers, DO Electronically Signed Final Report   12/29/2022 02:22 pm ----------------------------------------------------------------------   [place] MFM OB Limited  Result Date: 12/29/2022 ----------------------------------------------------------------------  OBSTETRICS REPORT                        (Signed Final 12/29/2022 02:22 pm) ---------------------------------------------------------------------- Patient Info  ID #:       [id number]                          D.O.B.:  [birth year] (27 yrs)  Name:       Donna HORKEY-              Visit Date: 12/28/2022 03:34 pm              [facility] ---------------------------------------------------------------------- Performed By  Attending:        Braxton Feathers DO       Referred By:       [facility] MAU/Triage  Performed By:     Percell Boston          [facility] ---------------------------------------------------------------------- Orders  #  sided  Thoracic:              Appears normal         Kidneys:                Appear normal  Heart:                 Visualized             Bladder:                Appears normal ---------------------------------------------------------------------- Cervix Uterus Adnexa  Cervix  Not visualized (advanced GA >24wks)  Uterus  No abnormality visualized.  Right Ovary  Not visualized.  Left Ovary  Not visualized.  Cul De Sac  No free fluid seen.  Adnexa  No abnormality visualized ---------------------------------------------------------------------- Comments  Hospital Ultrasound  30w 6d at the MAU for decreased FM and a history of a  "blood clot" by the placenta. EDD: 03/02/2023 by LMP  (05/26/22).  Sonographic findings  Single intrauterine pregnancy.  Fetal cardiac activity: Observed and appears normal.  Presentation: Cephalic.  Limited fetal anatomy appears normal.  Amniotic fluid volume: Within normal limits. AFI: 10.7 cm.  MVP: 3.9 cm.  Placenta: Posterior. There is no sonographic evidence of  bleeding.  BPP: 8/8.  Recommendations  - While there is no sonogrpahic evidence of placental  bleeding, placental abruption is a clinical diagnosis and  ultrasound findings of placental beeding are seen in less than  25% of cases  - Continue clinical management per OB provider  - Consider serial growth [place] and weekly antenatal testing  around 32 weeks if the is concern for a chronic abruption.  This was a limited ultrasound with a remote read. If an official  MFM consult is requested for any reason please call/place an  order in Epic.  ----------------------------------------------------------------------                  Braxton Feathers, DO Electronically Signed Final Report   12/29/2022 02:22 pm ----------------------------------------------------------------------   [place] MFM OB Limited  Result Date: 12/29/2022 ----------------------------------------------------------------------  OBSTETRICS REPORT                        (Signed Final 12/29/2022 02:22 pm) ---------------------------------------------------------------------- Patient Info  ID #:       [id number]                          D.O.B.:  [birth year] (27 yrs)  Name:       Donna HORKEY-              Visit Date: 12/28/2022 03:34 pm              [facility] ---------------------------------------------------------------------- Performed By  Attending:        Braxton Feathers DO       Referred By:       [facility] MAU/Triage  Performed By:     Percell Boston          [facility] ---------------------------------------------------------------------- Orders  #  sided  Thoracic:              Appears normal         Kidneys:                Appear normal  Heart:                 Visualized             Bladder:                Appears normal ---------------------------------------------------------------------- Cervix Uterus Adnexa  Cervix  Not visualized (advanced GA >24wks)  Uterus  No abnormality visualized.  Right Ovary  Not visualized.  Left Ovary  Not visualized.  Cul De Sac  No free fluid seen.  Adnexa  No abnormality visualized ---------------------------------------------------------------------- Comments  Hospital Ultrasound  30w 6d at the MAU for decreased FM and a history of a  "blood clot" by the placenta. EDD: 03/02/2023 by LMP  (05/26/22).  Sonographic findings  Single intrauterine pregnancy.  Fetal cardiac activity: Observed and appears normal.  Presentation: Cephalic.  Limited fetal anatomy appears normal.  Amniotic fluid volume: Within normal limits. AFI: 10.7 cm.  MVP: 3.9 cm.  Placenta: Posterior. There is no sonographic evidence of  bleeding.  BPP: 8/8.  Recommendations  - While there is no sonogrpahic evidence of placental  bleeding, placental abruption is a clinical diagnosis and  ultrasound findings of placental beeding are seen in less than  25% of cases  - Continue clinical management per OB provider  - Consider serial growth [place] and weekly antenatal testing  around 32 weeks if the is concern for a chronic abruption.  This was a limited ultrasound with a remote read. If an official  MFM consult is requested for any reason please call/place an  order in Epic.  ----------------------------------------------------------------------                  Braxton Feathers, DO Electronically Signed Final Report   12/29/2022 02:22 pm ----------------------------------------------------------------------   [place] MFM OB Limited  Result Date: 12/29/2022 ----------------------------------------------------------------------  OBSTETRICS REPORT                        (Signed Final 12/29/2022 02:22 pm) ---------------------------------------------------------------------- Patient Info  ID #:       [id number]                          D.O.B.:  [birth year] (27 yrs)  Name:       Donna HORKEY-              Visit Date: 12/28/2022 03:34 pm              [facility] ---------------------------------------------------------------------- Performed By  Attending:        Braxton Feathers DO       Referred By:       [facility] MAU/Triage  Performed By:     Percell Boston          [facility] ---------------------------------------------------------------------- Orders  #  Collection Time: 01/09/23 12:20 PM  Result Value Ref Range   ABO/RH(D) PENDING    Antibody Screen PENDING    Sample Expiration      01/12/2023,2359 Performed at [facility], [address] [place], [place] [address]     Imaging Studies: 01/09/23: No previa/abruption, nml AFV, BPP 8/8, BREECH 01/09/23:  [place] MFM FETAL BPP WO NON STRESS  Result Date: 12/29/2022 ----------------------------------------------------------------------  OBSTETRICS REPORT                        (Signed Final 12/29/2022 02:22 pm) ---------------------------------------------------------------------- Patient Info  ID #:       [id number]                          D.O.B.:  1996/03/24 (27 yrs)  Name:       Donna JUPITER-              Visit Date: 12/28/2022 03:34 pm              [facility] ---------------------------------------------------------------------- Performed By  Attending:        Braxton Feathers DO       Referred By:       [facility] MAU/Triage  Performed By:     Percell Boston          [facility] ---------------------------------------------------------------------- Orders  #  Description                           Code        Ordered By  1  [place] MFM FETAL BPP WO NON               76819.01    MATTHEW ECKSTAT     STRESS  2  [place] MFM OB LIMITED                     [id number]    MATTHEW ECKSTAT  ----------------------------------------------------------------------  #  Order #                     Accession #                Episode #  1  [id number]                   [id number]                 [id number]  2  [id number]                   [id number]                 [id number] ---------------------------------------------------------------------- Indications  Poor obstetric history: Previous gestational    O09.299  diabetes  Decreased fetal movements, third trimester,     O36.8130  unspecified  Encounter for antenatal screening,              Z36.9  unspecified  Abnormal fetal ultrasound (in [place] last week,  sided  Thoracic:              Appears normal         Kidneys:                Appear normal  Heart:                 Visualized             Bladder:                Appears normal ---------------------------------------------------------------------- Cervix Uterus Adnexa  Cervix  Not visualized (advanced GA >24wks)  Uterus  No abnormality visualized.  Right Ovary  Not visualized.  Left Ovary  Not visualized.  Cul De Sac  No free fluid seen.  Adnexa  No abnormality visualized ---------------------------------------------------------------------- Comments  Hospital Ultrasound  30w 6d at the MAU for decreased FM and a history of a  "blood clot" by the placenta. EDD: 03/02/2023 by LMP  (05/26/22).  Sonographic findings  Single intrauterine pregnancy.  Fetal cardiac activity: Observed and appears normal.  Presentation: Cephalic.  Limited fetal anatomy appears normal.  Amniotic fluid volume: Within normal limits. AFI: 10.7 cm.  MVP: 3.9 cm.  Placenta: Posterior. There is no sonographic evidence of  bleeding.  BPP: 8/8.  Recommendations  - While there is no sonogrpahic evidence of placental  bleeding, placental abruption is a clinical diagnosis and  ultrasound findings of placental beeding are seen in less than  25% of cases  - Continue clinical management per OB provider  - Consider serial growth [place] and weekly antenatal testing  around 32 weeks if the is concern for a chronic abruption.  This was a limited ultrasound with a remote read. If an official  MFM consult is requested for any reason please call/place an  order in Epic.  ----------------------------------------------------------------------                  Braxton Feathers, DO Electronically Signed Final Report   12/29/2022 02:22 pm ----------------------------------------------------------------------   [place] MFM OB Limited  Result Date: 12/29/2022 ----------------------------------------------------------------------  OBSTETRICS REPORT                        (Signed Final 12/29/2022 02:22 pm) ---------------------------------------------------------------------- Patient Info  ID #:       [id number]                          D.O.B.:  [birth year] (27 yrs)  Name:       Donna HORKEY-              Visit Date: 12/28/2022 03:34 pm              [place] ---------------------------------------------------------------------- Performed By  Attending:        Braxton Feathers DO       Referred By:       [facility] MAU/Triage  Performed By:     Percell Boston          [facility] ---------------------------------------------------------------------- Orders  #

## 2023-01-10 DIAGNOSIS — Z3A32 32 weeks gestation of pregnancy: Secondary | ICD-10-CM | POA: Diagnosis not present

## 2023-01-10 DIAGNOSIS — O4693 Antepartum hemorrhage, unspecified, third trimester: Secondary | ICD-10-CM | POA: Diagnosis not present

## 2023-01-10 LAB — GC/CHLAMYDIA PROBE AMP (~~LOC~~) NOT AT ARMC
Chlamydia: NEGATIVE
Comment: NEGATIVE
Comment: NORMAL
Neisseria Gonorrhea: NEGATIVE

## 2023-01-10 MED ORDER — LACTATED RINGERS IV BOLUS
1000.0000 mL | Freq: Once | INTRAVENOUS | Status: AC
  Administered 2023-01-10: 1000 mL via INTRAVENOUS

## 2023-01-10 NOTE — Progress Notes (Signed)
Final Report   01/09/2023 03:50 pm ----------------------------------------------------------------------   [place] MFM FETAL BPP WO NON STRESS  Result Date: 01/09/2023 ----------------------------------------------------------------------  OBSTETRICS REPORT                       (Signed Final 01/09/2023 03:50 pm) ---------------------------------------------------------------------- Patient Info  ID #:       [id number]                          D.O.B.:  12-12-1995 (27 yrs)  Name:       Donna Mcknight-              Visit Date: 01/09/2023 10:36 am              [place] ---------------------------------------------------------------------- Performed By  Attending:        Lin Landsman      Referred By:      [facility] MAU/Triage                    MD  Performed By:     Percell Boston          [facility] ---------------------------------------------------------------------- Orders  #  Description                           Code        Ordered By  1  [place] MFM OB LIMITED                     76815.01    ELIZABETH DAVIS  2  [place] MFM FETAL BPP WO NON               76819.01    ELIZABETH DAVIS     STRESS ----------------------------------------------------------------------  #  Order #                     Accession #                Episode #  1  [id number]                   [id number]                 [id number]  2  [id number]                   [id number]                 [id number] ---------------------------------------------------------------------- Indications  Vaginal bleeding in pregnancy, third trimester O46.93  Poor obstetric history: Previous gestational   O09.299  diabetes  Encounter for antenatal screening,             Z36.9  unspecified  [redacted] weeks gestation of pregnancy                Z3A.32 ---------------------------------------------------------------------- Fetal Evaluation  Num Of Fetuses:         1  Fetal Heart Rate(bpm):  141  Cardiac Activity:  D.O.B.:  Dec 01, 1995 (27 yrs)  Name:       Donna Mcknight-              Visit Date: 01/09/2023 10:36 am              [place] ---------------------------------------------------------------------- Performed By  Attending:        Lin Landsman      Referred By:      [facility] MAU/Triage                    MD  Performed By:     Percell Boston          [facility] ---------------------------------------------------------------------- Orders  #  Description                           Code        Ordered By  1  [place] MFM OB LIMITED                      76815.01    ELIZABETH DAVIS  2  [place] MFM FETAL BPP WO NON               76819.01    ELIZABETH DAVIS     STRESS ----------------------------------------------------------------------  #  Order #                     Accession #                Episode #  1  [id number]                   [id number]                 [id number]  2  [id number]                   [id number]                 [id number] ---------------------------------------------------------------------- Indications  Vaginal bleeding in pregnancy, third trimester O46.93  Poor obstetric history: Previous gestational   O09.299  diabetes  Encounter for antenatal screening,             Z36.9  unspecified  [redacted] weeks gestation of pregnancy                Z3A.32 ---------------------------------------------------------------------- Fetal Evaluation  Num Of Fetuses:         1  Fetal Heart Rate(bpm):  141  Cardiac Activity:       Observed  Presentation:           Breech  Placenta:               Posterior  P. Cord Insertion:      Visualized, central  Amniotic Fluid  AFI FV:      Within normal limits  AFI Sum(cm)     %Tile       Largest Pocket(cm)  13.9            47  Final Report   01/09/2023 03:50 pm ----------------------------------------------------------------------   [place] MFM FETAL BPP WO NON STRESS  Result Date: 01/09/2023 ----------------------------------------------------------------------  OBSTETRICS REPORT                       (Signed Final 01/09/2023 03:50 pm) ---------------------------------------------------------------------- Patient Info  ID #:       [id number]                          D.O.B.:  12-12-1995 (27 yrs)  Name:       Donna Mcknight-              Visit Date: 01/09/2023 10:36 am              [place] ---------------------------------------------------------------------- Performed By  Attending:        Lin Landsman      Referred By:      [facility] MAU/Triage                    MD  Performed By:     Percell Boston          [facility] ---------------------------------------------------------------------- Orders  #  Description                           Code        Ordered By  1  [place] MFM OB LIMITED                     76815.01    ELIZABETH DAVIS  2  [place] MFM FETAL BPP WO NON               76819.01    ELIZABETH DAVIS     STRESS ----------------------------------------------------------------------  #  Order #                     Accession #                Episode #  1  [id number]                   [id number]                 [id number]  2  [id number]                   [id number]                 [id number] ---------------------------------------------------------------------- Indications  Vaginal bleeding in pregnancy, third trimester O46.93  Poor obstetric history: Previous gestational   O09.299  diabetes  Encounter for antenatal screening,             Z36.9  unspecified  [redacted] weeks gestation of pregnancy                Z3A.32 ---------------------------------------------------------------------- Fetal Evaluation  Num Of Fetuses:         1  Fetal Heart Rate(bpm):  141  Cardiac Activity:  Final Report   01/09/2023 03:50 pm ----------------------------------------------------------------------   [place] MFM FETAL BPP WO NON STRESS  Result Date: 01/09/2023 ----------------------------------------------------------------------  OBSTETRICS REPORT                       (Signed Final 01/09/2023 03:50 pm) ---------------------------------------------------------------------- Patient Info  ID #:       [id number]                          D.O.B.:  12-12-1995 (27 yrs)  Name:       Donna Mcknight-              Visit Date: 01/09/2023 10:36 am              [place] ---------------------------------------------------------------------- Performed By  Attending:        Lin Landsman      Referred By:      [facility] MAU/Triage                    MD  Performed By:     Percell Boston          [facility] ---------------------------------------------------------------------- Orders  #  Description                           Code        Ordered By  1  [place] MFM OB LIMITED                     76815.01    ELIZABETH DAVIS  2  [place] MFM FETAL BPP WO NON               76819.01    ELIZABETH DAVIS     STRESS ----------------------------------------------------------------------  #  Order #                     Accession #                Episode #  1  [id number]                   [id number]                 [id number]  2  [id number]                   [id number]                 [id number] ---------------------------------------------------------------------- Indications  Vaginal bleeding in pregnancy, third trimester O46.93  Poor obstetric history: Previous gestational   O09.299  diabetes  Encounter for antenatal screening,             Z36.9  unspecified  [redacted] weeks gestation of pregnancy                Z3A.32 ---------------------------------------------------------------------- Fetal Evaluation  Num Of Fetuses:         1  Fetal Heart Rate(bpm):  141  Cardiac Activity:  Final Report   01/09/2023 03:50 pm ----------------------------------------------------------------------   [place] MFM FETAL BPP WO NON STRESS  Result Date: 01/09/2023 ----------------------------------------------------------------------  OBSTETRICS REPORT                       (Signed Final 01/09/2023 03:50 pm) ---------------------------------------------------------------------- Patient Info  ID #:       [id number]                          D.O.B.:  12-12-1995 (27 yrs)  Name:       Donna Mcknight-              Visit Date: 01/09/2023 10:36 am              [place] ---------------------------------------------------------------------- Performed By  Attending:        Lin Landsman      Referred By:      [facility] MAU/Triage                    MD  Performed By:     Percell Boston          [facility] ---------------------------------------------------------------------- Orders  #  Description                           Code        Ordered By  1  [place] MFM OB LIMITED                     76815.01    ELIZABETH DAVIS  2  [place] MFM FETAL BPP WO NON               76819.01    ELIZABETH DAVIS     STRESS ----------------------------------------------------------------------  #  Order #                     Accession #                Episode #  1  [id number]                   [id number]                 [id number]  2  [id number]                   [id number]                 [id number] ---------------------------------------------------------------------- Indications  Vaginal bleeding in pregnancy, third trimester O46.93  Poor obstetric history: Previous gestational   O09.299  diabetes  Encounter for antenatal screening,             Z36.9  unspecified  [redacted] weeks gestation of pregnancy                Z3A.32 ---------------------------------------------------------------------- Fetal Evaluation  Num Of Fetuses:         1  Fetal Heart Rate(bpm):  141  Cardiac Activity:  D.O.B.:  Dec 01, 1995 (27 yrs)  Name:       Donna Mcknight-              Visit Date: 01/09/2023 10:36 am              [place] ---------------------------------------------------------------------- Performed By  Attending:        Lin Landsman      Referred By:      [facility] MAU/Triage                    MD  Performed By:     Percell Boston          [facility] ---------------------------------------------------------------------- Orders  #  Description                           Code        Ordered By  1  [place] MFM OB LIMITED                      76815.01    ELIZABETH DAVIS  2  [place] MFM FETAL BPP WO NON               76819.01    ELIZABETH DAVIS     STRESS ----------------------------------------------------------------------  #  Order #                     Accession #                Episode #  1  [id number]                   [id number]                 [id number]  2  [id number]                   [id number]                 [id number] ---------------------------------------------------------------------- Indications  Vaginal bleeding in pregnancy, third trimester O46.93  Poor obstetric history: Previous gestational   O09.299  diabetes  Encounter for antenatal screening,             Z36.9  unspecified  [redacted] weeks gestation of pregnancy                Z3A.32 ---------------------------------------------------------------------- Fetal Evaluation  Num Of Fetuses:         1  Fetal Heart Rate(bpm):  141  Cardiac Activity:       Observed  Presentation:           Breech  Placenta:               Posterior  P. Cord Insertion:      Visualized, central  Amniotic Fluid  AFI FV:      Within normal limits  AFI Sum(cm)     %Tile       Largest Pocket(cm)  13.9            47

## 2023-01-11 DIAGNOSIS — Z79899 Other long term (current) drug therapy: Secondary | ICD-10-CM | POA: Diagnosis not present

## 2023-01-11 DIAGNOSIS — O4693 Antepartum hemorrhage, unspecified, third trimester: Secondary | ICD-10-CM | POA: Diagnosis not present

## 2023-01-11 DIAGNOSIS — Z3A32 32 weeks gestation of pregnancy: Secondary | ICD-10-CM | POA: Diagnosis not present

## 2023-01-11 NOTE — Discharge Summary (Signed)
Lab, [address] [place], [place] [address]    [place] MFM OB LIMITED  Result Date: 01/09/2023 ----------------------------------------------------------------------  OBSTETRICS REPORT                       (Signed Final 01/09/2023 03:50 pm) ---------------------------------------------------------------------- Patient Info  ID #:       [id number]                          D.O.B.:  [birth year] (27 yrs)  Name:       Donna KEN-              Visit Date: 01/09/2023  10:36 am              Donna Mcknight ---------------------------------------------------------------------- Performed By  Attending:        Lin Landsman      Referred By:      [facility] MAU/Triage                    MD  Performed By:     Percell Boston          [facility] ---------------------------------------------------------------------- Orders  #  Description                           Code        Ordered By  1  [place] MFM OB LIMITED                     76815.01    Donna Mcknight  2  [place] MFM FETAL BPP WO NON               76819.01    Donna Mcknight     STRESS ----------------------------------------------------------------------  #  Order #                     Accession #                Episode #  1  [id number]                   [id number]                 [id number]  2  [id number]                   [id number]                 [id number] ---------------------------------------------------------------------- Indications  Vaginal bleeding in pregnancy, third trimester O46.93  Poor obstetric history: Previous gestational   O09.299  diabetes  Encounter for antenatal screening,             Z36.9  unspecified  [redacted] weeks gestation of pregnancy                Z3A.32 ---------------------------------------------------------------------- Fetal Evaluation  Num Of Fetuses:         1  Fetal Heart Rate(bpm):  141  Cardiac Activity:  Anatomy  Cranium:               Appears norma          Diaphragm:              Appears normal  Ventricles:            Appears normal         Stomach:                Appears normal, left                                                                        sided  Thoracic:              Appears normal         Kidneys:                Appear normal  Heart:                 Visualized             Bladder:                 Appears normal ---------------------------------------------------------------------- Cervix Uterus Adnexa  Cervix  Not visualized (advanced GA >24wks)  Uterus  No abnormality visualized.  Right Ovary  Not visualized.  Left Ovary  Not visualized.  Cul De Sac  No free fluid seen.  Adnexa  No abnormality visualized ---------------------------------------------------------------------- Comments  Hospital Ultrasound  30w 6d at the MAU for decreased FM and a history of a  "blood clot" by the placenta. EDD: 03/02/2023 by LMP  (05/26/22).  Sonographic findings  Single intrauterine pregnancy.  Fetal cardiac activity: Observed and appears normal.  Presentation: Cephalic.  Limited fetal anatomy appears normal.  Amniotic fluid volume: Within normal limits. AFI: 10.7 cm.  MVP: 3.9 cm.  Placenta: Posterior. There is no sonographic evidence of  bleeding.  BPP: 8/8.  Recommendations  - While there is no sonogrpahic evidence of placental  bleeding, placental abruption is a clinical diagnosis and  ultrasound findings of placental beeding are seen in less than  25% of cases  - Continue clinical management per OB provider  - Consider serial growth [place] and weekly antenatal testing  around 32 weeks if the is concern for a chronic abruption.  This was a limited ultrasound with a remote read. If an official  MFM consult is requested for any reason please call/place an  order in Epic. ----------------------------------------------------------------------                  Braxton Feathers, DO Electronically Signed Final Report   12/29/2022 02:22 pm ----------------------------------------------------------------------   [place] MFM OB Limited  Result Date: 12/29/2022 ----------------------------------------------------------------------  OBSTETRICS REPORT                        (Signed Final 12/29/2022 02:22 pm) ---------------------------------------------------------------------- Patient Info  ID #:       [id number]                           D.O.B.:  July 08, 1995 (27 yrs)  Name:       Donna Mcknight  Antenatal Physician Discharge Summary  Patient ID: Donna Mcknight MRN: [id number] DOB/AGE: December 21, 1995 27 y.o.  Admit date: 01/09/2023 Discharge date: 01/11/2023  Admission Diagnoses: Principal Problem:   Vaginal bleeding in pregnancy, third trimester Active Problems:   [redacted] weeks gestation of pregnancy  Discharge Diagnoses: The same  Prenatal Procedures: NST and ultrasound  Consults: Neonatology  Hospital Course:  Donna Mcknight is a 27 y.o. G2P1001 with IUP at [redacted]w[redacted]d admitted for vaginal bleeding that occurred after intercourse; but was more than expected.   She received betamethasone x 2 doses. Ultrasound did not show any previa or abruption. She was seen by Neonatology during her stay.  She was observed for over 48 hours, had no further bleeding, the fetal heart rate monitoring remained reassuring, and she had no signs/symptoms of progressing preterm labor or other maternal-fetal concerns. She was deemed stable for discharge to home with outpatient follow up.  Discharge Exam: Temp:  [97.6 F (36.4 C)-98.3 F (36.8 C)] 97.9 F (36.6 C) (10/02 0830) Pulse Rate:  [79-98] 92 (10/02 0830) Resp:  [16-18] 16 (10/02 0830) BP: (107-123)/(54-76) 107/66 (10/02 0830) SpO2:  [96 %-98 %] 98 % (10/02 0830) Physical Examination: CONSTITUTIONAL: Well-developed, well-nourished female in no acute distress.  SKIN: Skin is warm and dry. No rash noted. Not diaphoretic. No erythema. No pallor. NEUROLOGIC: Alert and oriented to person, place, and time. Normal reflexes, muscle tone coordination. No cranial nerve deficit noted. PSYCHIATRIC: Normal mood and affect. Normal behavior. Normal judgment and thought content. CARDIOVASCULAR: Normal heart rate noted, regular rhythm RESPIRATORY: Effort and breath sounds normal, no problems with respiration noted MUSCULOSKELETAL: Normal range of motion. No edema and no tenderness. 2+ distal pulses. ABDOMEN: Soft, nontender, nondistended,  gravid. CERVIX:  Deferred  Fetal monitoring: FHR: 140 bpm, Variability: moderate, Accelerations: Present, Decelerations: Absent  Uterine activity: Rare contractions   Significant Diagnostic Studies:  Results for orders placed or performed during the hospital encounter of 01/09/23 (from the past 168 hour(s))  Wet prep, genital   Collection Time: 01/09/23 11:05 AM  Result Value Ref Range   Yeast Wet Prep HPF POC NONE SEEN NONE SEEN   Trich, Wet Prep NONE SEEN NONE SEEN   Clue Cells Wet Prep HPF POC NONE SEEN NONE SEEN   WBC, Wet Prep HPF POC <10 <10   Sperm NONE SEEN   GC/Chlamydia probe amp ([place])not at [facility]   Collection Time: 01/09/23 11:05 AM  Result Value Ref Range   Neisseria Gonorrhea Negative    Chlamydia Negative    Comment Normal Reference [place] Chlamydia - Negative    Comment      Normal Reference Range Neisseria Gonorrhea - Negative  CBC   Collection Time: 01/09/23 12:29 PM  Result Value Ref Range   WBC 9.4 4.0 - 10.5 K/uL   RBC 3.87 3.87 - 5.11 MIL/uL   Hemoglobin 11.3 (L) 12.0 - 15.0 g/dL   HCT 78.2 (L) 95.6 - 21.3 %   MCV 88.6 80.0 - 100.0 fL   MCH 29.2 26.0 - 34.0 pg   MCHC 32.9 30.0 - 36.0 g/dL   RDW 08.6 57.8 - 46.9 %   Platelets 268 150 - 400 K/uL   nRBC 0.0 0.0 - 0.2 %  Type and screen [facility]   Collection Time: 01/09/23 12:29 PM  Result Value Ref Range   ABO/RH(D) O POS    Antibody Screen NEG    Sample Expiration      01/12/2023,2359 Performed at [facility]  Uterus Adnexa  Cervix  Not visualized (advanced GA >24wks)  Uterus  No abnormality visualized.  Right Ovary  Not visualized.  Left Ovary  Not visualized.  Cul De Sac  No free fluid seen.  Adnexa  No abnormality visualized ---------------------------------------------------------------------- Impression  Antenatal testing performed given maternal third trimester  bleeding.  The biophysical profile was 8/8 with good fetal movement and  amniotic fluid volume. ---------------------------------------------------------------------- Recommendations  Clinical correlation recommended. ----------------------------------------------------------------------              Lin Landsman, MD Electronically Signed Final Report   01/09/2023 03:50 pm ----------------------------------------------------------------------   [place] MFM FETAL BPP WO NON STRESS  Result  Date: 01/09/2023 ----------------------------------------------------------------------  OBSTETRICS REPORT                       (Signed Final 01/09/2023 03:50 pm) ---------------------------------------------------------------------- Patient Info  ID #:       [id number]                          D.O.B.:  [birth year] (27 yrs)  Name:       Donna THORNBERRY-              Visit Date: 01/09/2023 10:36 am              Donna Mcknight ---------------------------------------------------------------------- Performed By  Attending:        Lin Landsman      Referred By:      [facility] MAU/Triage                    MD  Performed By:     Percell Boston          [facility] ---------------------------------------------------------------------- Orders  #  Description                           Code        Ordered By  1  [place] MFM OB LIMITED                     76815.01    Donna Mcknight  2  [place] MFM FETAL BPP WO NON               82956.21    Donna Mcknight     STRESS ----------------------------------------------------------------------  #  Order #                     Accession #                Episode #  1  [id number]                   [id number]                 [id number]  2  [id number]                   [id number]  Uterus Adnexa  Cervix  Not visualized (advanced GA >24wks)  Uterus  No abnormality visualized.  Right Ovary  Not visualized.  Left Ovary  Not visualized.  Cul De Sac  No free fluid seen.  Adnexa  No abnormality visualized ---------------------------------------------------------------------- Impression  Antenatal testing performed given maternal third trimester  bleeding.  The biophysical profile was 8/8 with good fetal movement and  amniotic fluid volume. ---------------------------------------------------------------------- Recommendations  Clinical correlation recommended. ----------------------------------------------------------------------              Lin Landsman, MD Electronically Signed Final Report   01/09/2023 03:50 pm ----------------------------------------------------------------------   [place] MFM FETAL BPP WO NON STRESS  Result  Date: 01/09/2023 ----------------------------------------------------------------------  OBSTETRICS REPORT                       (Signed Final 01/09/2023 03:50 pm) ---------------------------------------------------------------------- Patient Info  ID #:       [id number]                          D.O.B.:  [birth year] (27 yrs)  Name:       Donna THORNBERRY-              Visit Date: 01/09/2023 10:36 am              Donna Mcknight ---------------------------------------------------------------------- Performed By  Attending:        Lin Landsman      Referred By:      [facility] MAU/Triage                    MD  Performed By:     Percell Boston          [facility] ---------------------------------------------------------------------- Orders  #  Description                           Code        Ordered By  1  [place] MFM OB LIMITED                     76815.01    Donna Mcknight  2  [place] MFM FETAL BPP WO NON               82956.21    Donna Mcknight     STRESS ----------------------------------------------------------------------  #  Order #                     Accession #                Episode #  1  [id number]                   [id number]                 [id number]  2  [id number]                   [id number]  Antenatal Physician Discharge Summary  Patient ID: Donna Mcknight MRN: [id number] DOB/AGE: December 21, 1995 27 y.o.  Admit date: 01/09/2023 Discharge date: 01/11/2023  Admission Diagnoses: Principal Problem:   Vaginal bleeding in pregnancy, third trimester Active Problems:   [redacted] weeks gestation of pregnancy  Discharge Diagnoses: The same  Prenatal Procedures: NST and ultrasound  Consults: Neonatology  Hospital Course:  Donna Mcknight is a 27 y.o. G2P1001 with IUP at [redacted]w[redacted]d admitted for vaginal bleeding that occurred after intercourse; but was more than expected.   She received betamethasone x 2 doses. Ultrasound did not show any previa or abruption. She was seen by Neonatology during her stay.  She was observed for over 48 hours, had no further bleeding, the fetal heart rate monitoring remained reassuring, and she had no signs/symptoms of progressing preterm labor or other maternal-fetal concerns. She was deemed stable for discharge to home with outpatient follow up.  Discharge Exam: Temp:  [97.6 F (36.4 C)-98.3 F (36.8 C)] 97.9 F (36.6 C) (10/02 0830) Pulse Rate:  [79-98] 92 (10/02 0830) Resp:  [16-18] 16 (10/02 0830) BP: (107-123)/(54-76) 107/66 (10/02 0830) SpO2:  [96 %-98 %] 98 % (10/02 0830) Physical Examination: CONSTITUTIONAL: Well-developed, well-nourished female in no acute distress.  SKIN: Skin is warm and dry. No rash noted. Not diaphoretic. No erythema. No pallor. NEUROLOGIC: Alert and oriented to person, place, and time. Normal reflexes, muscle tone coordination. No cranial nerve deficit noted. PSYCHIATRIC: Normal mood and affect. Normal behavior. Normal judgment and thought content. CARDIOVASCULAR: Normal heart rate noted, regular rhythm RESPIRATORY: Effort and breath sounds normal, no problems with respiration noted MUSCULOSKELETAL: Normal range of motion. No edema and no tenderness. 2+ distal pulses. ABDOMEN: Soft, nontender, nondistended,  gravid. CERVIX:  Deferred  Fetal monitoring: FHR: 140 bpm, Variability: moderate, Accelerations: Present, Decelerations: Absent  Uterine activity: Rare contractions   Significant Diagnostic Studies:  Results for orders placed or performed during the hospital encounter of 01/09/23 (from the past 168 hour(s))  Wet prep, genital   Collection Time: 01/09/23 11:05 AM  Result Value Ref Range   Yeast Wet Prep HPF POC NONE SEEN NONE SEEN   Trich, Wet Prep NONE SEEN NONE SEEN   Clue Cells Wet Prep HPF POC NONE SEEN NONE SEEN   WBC, Wet Prep HPF POC <10 <10   Sperm NONE SEEN   GC/Chlamydia probe amp ([place])not at [facility]   Collection Time: 01/09/23 11:05 AM  Result Value Ref Range   Neisseria Gonorrhea Negative    Chlamydia Negative    Comment Normal Reference [place] Chlamydia - Negative    Comment      Normal Reference Range Neisseria Gonorrhea - Negative  CBC   Collection Time: 01/09/23 12:29 PM  Result Value Ref Range   WBC 9.4 4.0 - 10.5 K/uL   RBC 3.87 3.87 - 5.11 MIL/uL   Hemoglobin 11.3 (L) 12.0 - 15.0 g/dL   HCT 78.2 (L) 95.6 - 21.3 %   MCV 88.6 80.0 - 100.0 fL   MCH 29.2 26.0 - 34.0 pg   MCHC 32.9 30.0 - 36.0 g/dL   RDW 08.6 57.8 - 46.9 %   Platelets 268 150 - 400 K/uL   nRBC 0.0 0.0 - 0.2 %  Type and screen [facility]   Collection Time: 01/09/23 12:29 PM  Result Value Ref Range   ABO/RH(D) O POS    Antibody Screen NEG    Sample Expiration      01/12/2023,2359 Performed at [facility]  sided  Thoracic:              Appears normal         Kidneys:                Appear normal  Diaphragm:             Appears normal         Bladder:                Appears normal ---------------------------------------------------------------------- Cervix Uterus Adnexa  Cervix  Not visualized (advanced GA >24wks)  Uterus  No abnormality visualized.  Right Ovary  Not visualized.  Left Ovary  Not visualized.  Cul De Sac  No free fluid seen.  Adnexa  No abnormality visualized ---------------------------------------------------------------------- Impression  Antenatal testing performed given maternal third trimester  bleeding.  The biophysical profile was 8/8 with good fetal movement and  amniotic fluid volume.  ---------------------------------------------------------------------- Recommendations  Clinical correlation recommended. ----------------------------------------------------------------------              Lin Landsman, MD Electronically Signed Final Report   01/09/2023 03:50 pm ----------------------------------------------------------------------   [place] MFM FETAL BPP WO NON STRESS  Result Date: 12/29/2022 ----------------------------------------------------------------------  OBSTETRICS REPORT                        (Signed Final 12/29/2022 02:22 pm) ---------------------------------------------------------------------- Patient Info  ID #:       [id number]                          D.O.B.:  09/09/1995 (27 yrs)  Name:       Donna BUIST-              Visit Date: 12/28/2022 03:34 pm              Donna Mcknight ---------------------------------------------------------------------- Performed By  Attending:        Braxton Feathers DO       Referred By:       [facility] MAU/Triage  Performed By:     Percell Boston          [facility] ---------------------------------------------------------------------- Orders  #  Description                           Code        Ordered By  1  [place] MFM FETAL BPP WO NON               76819.01    MATTHEW ECKSTAT     STRESS  2  [place] MFM OB LIMITED                     40981.19    MATTHEW ECKSTAT ----------------------------------------------------------------------  #  Order #                     Accession #                Episode #  1  Uterus Adnexa  Cervix  Not visualized (advanced GA >24wks)  Uterus  No abnormality visualized.  Right Ovary  Not visualized.  Left Ovary  Not visualized.  Cul De Sac  No free fluid seen.  Adnexa  No abnormality visualized ---------------------------------------------------------------------- Impression  Antenatal testing performed given maternal third trimester  bleeding.  The biophysical profile was 8/8 with good fetal movement and  amniotic fluid volume. ---------------------------------------------------------------------- Recommendations  Clinical correlation recommended. ----------------------------------------------------------------------              Lin Landsman, MD Electronically Signed Final Report   01/09/2023 03:50 pm ----------------------------------------------------------------------   [place] MFM FETAL BPP WO NON STRESS  Result  Date: 01/09/2023 ----------------------------------------------------------------------  OBSTETRICS REPORT                       (Signed Final 01/09/2023 03:50 pm) ---------------------------------------------------------------------- Patient Info  ID #:       [id number]                          D.O.B.:  [birth year] (27 yrs)  Name:       Donna THORNBERRY-              Visit Date: 01/09/2023 10:36 am              Donna Mcknight ---------------------------------------------------------------------- Performed By  Attending:        Lin Landsman      Referred By:      [facility] MAU/Triage                    MD  Performed By:     Percell Boston          [facility] ---------------------------------------------------------------------- Orders  #  Description                           Code        Ordered By  1  [place] MFM OB LIMITED                     76815.01    Donna Mcknight  2  [place] MFM FETAL BPP WO NON               82956.21    Donna Mcknight     STRESS ----------------------------------------------------------------------  #  Order #                     Accession #                Episode #  1  [id number]                   [id number]                 [id number]  2  [id number]                   [id number]  sided  Thoracic:              Appears normal         Kidneys:                Appear normal  Diaphragm:             Appears normal         Bladder:                Appears normal ---------------------------------------------------------------------- Cervix Uterus Adnexa  Cervix  Not visualized (advanced GA >24wks)  Uterus  No abnormality visualized.  Right Ovary  Not visualized.  Left Ovary  Not visualized.  Cul De Sac  No free fluid seen.  Adnexa  No abnormality visualized ---------------------------------------------------------------------- Impression  Antenatal testing performed given maternal third trimester  bleeding.  The biophysical profile was 8/8 with good fetal movement and  amniotic fluid volume.  ---------------------------------------------------------------------- Recommendations  Clinical correlation recommended. ----------------------------------------------------------------------              Lin Landsman, MD Electronically Signed Final Report   01/09/2023 03:50 pm ----------------------------------------------------------------------   [place] MFM FETAL BPP WO NON STRESS  Result Date: 12/29/2022 ----------------------------------------------------------------------  OBSTETRICS REPORT                        (Signed Final 12/29/2022 02:22 pm) ---------------------------------------------------------------------- Patient Info  ID #:       [id number]                          D.O.B.:  09/09/1995 (27 yrs)  Name:       Donna BUIST-              Visit Date: 12/28/2022 03:34 pm              Donna Mcknight ---------------------------------------------------------------------- Performed By  Attending:        Braxton Feathers DO       Referred By:       [facility] MAU/Triage  Performed By:     Percell Boston          [facility] ---------------------------------------------------------------------- Orders  #  Description                           Code        Ordered By  1  [place] MFM FETAL BPP WO NON               76819.01    MATTHEW ECKSTAT     STRESS  2  [place] MFM OB LIMITED                     40981.19    MATTHEW ECKSTAT ----------------------------------------------------------------------  #  Order #                     Accession #                Episode #  1  Antenatal Physician Discharge Summary  Patient ID: Donna Mcknight MRN: [id number] DOB/AGE: December 21, 1995 27 y.o.  Admit date: 01/09/2023 Discharge date: 01/11/2023  Admission Diagnoses: Principal Problem:   Vaginal bleeding in pregnancy, third trimester Active Problems:   [redacted] weeks gestation of pregnancy  Discharge Diagnoses: The same  Prenatal Procedures: NST and ultrasound  Consults: Neonatology  Hospital Course:  Donna Mcknight is a 27 y.o. G2P1001 with IUP at [redacted]w[redacted]d admitted for vaginal bleeding that occurred after intercourse; but was more than expected.   She received betamethasone x 2 doses. Ultrasound did not show any previa or abruption. She was seen by Neonatology during her stay.  She was observed for over 48 hours, had no further bleeding, the fetal heart rate monitoring remained reassuring, and she had no signs/symptoms of progressing preterm labor or other maternal-fetal concerns. She was deemed stable for discharge to home with outpatient follow up.  Discharge Exam: Temp:  [97.6 F (36.4 C)-98.3 F (36.8 C)] 97.9 F (36.6 C) (10/02 0830) Pulse Rate:  [79-98] 92 (10/02 0830) Resp:  [16-18] 16 (10/02 0830) BP: (107-123)/(54-76) 107/66 (10/02 0830) SpO2:  [96 %-98 %] 98 % (10/02 0830) Physical Examination: CONSTITUTIONAL: Well-developed, well-nourished female in no acute distress.  SKIN: Skin is warm and dry. No rash noted. Not diaphoretic. No erythema. No pallor. NEUROLOGIC: Alert and oriented to person, place, and time. Normal reflexes, muscle tone coordination. No cranial nerve deficit noted. PSYCHIATRIC: Normal mood and affect. Normal behavior. Normal judgment and thought content. CARDIOVASCULAR: Normal heart rate noted, regular rhythm RESPIRATORY: Effort and breath sounds normal, no problems with respiration noted MUSCULOSKELETAL: Normal range of motion. No edema and no tenderness. 2+ distal pulses. ABDOMEN: Soft, nontender, nondistended,  gravid. CERVIX:  Deferred  Fetal monitoring: FHR: 140 bpm, Variability: moderate, Accelerations: Present, Decelerations: Absent  Uterine activity: Rare contractions   Significant Diagnostic Studies:  Results for orders placed or performed during the hospital encounter of 01/09/23 (from the past 168 hour(s))  Wet prep, genital   Collection Time: 01/09/23 11:05 AM  Result Value Ref Range   Yeast Wet Prep HPF POC NONE SEEN NONE SEEN   Trich, Wet Prep NONE SEEN NONE SEEN   Clue Cells Wet Prep HPF POC NONE SEEN NONE SEEN   WBC, Wet Prep HPF POC <10 <10   Sperm NONE SEEN   GC/Chlamydia probe amp ([place])not at [facility]   Collection Time: 01/09/23 11:05 AM  Result Value Ref Range   Neisseria Gonorrhea Negative    Chlamydia Negative    Comment Normal Reference Ranger Chlamydia - Negative    Comment      Normal Reference Range Neisseria Gonorrhea - Negative  CBC   Collection Time: 01/09/23 12:29 PM  Result Value Ref Range   WBC 9.4 4.0 - 10.5 K/uL   RBC 3.87 3.87 - 5.11 MIL/uL   Hemoglobin 11.3 (L) 12.0 - 15.0 g/dL   HCT 78.2 (L) 95.6 - 21.3 %   MCV 88.6 80.0 - 100.0 fL   MCH 29.2 26.0 - 34.0 pg   MCHC 32.9 30.0 - 36.0 g/dL   RDW 08.6 57.8 - 46.9 %   Platelets 268 150 - 400 K/uL   nRBC 0.0 0.0 - 0.2 %  Type and screen [facility]   Collection Time: 01/09/23 12:29 PM  Result Value Ref Range   ABO/RH(D) O POS    Antibody Screen NEG    Sample Expiration      01/12/2023,2359 Performed at [facility]  Uterus Adnexa  Cervix  Not visualized (advanced GA >24wks)  Uterus  No abnormality visualized.  Right Ovary  Not visualized.  Left Ovary  Not visualized.  Cul De Sac  No free fluid seen.  Adnexa  No abnormality visualized ---------------------------------------------------------------------- Impression  Antenatal testing performed given maternal third trimester  bleeding.  The biophysical profile was 8/8 with good fetal movement and  amniotic fluid volume. ---------------------------------------------------------------------- Recommendations  Clinical correlation recommended. ----------------------------------------------------------------------              Lin Landsman, MD Electronically Signed Final Report   01/09/2023 03:50 pm ----------------------------------------------------------------------   [place] MFM FETAL BPP WO NON STRESS  Result  Date: 01/09/2023 ----------------------------------------------------------------------  OBSTETRICS REPORT                       (Signed Final 01/09/2023 03:50 pm) ---------------------------------------------------------------------- Patient Info  ID #:       [id number]                          D.O.B.:  [birth year] (27 yrs)  Name:       Donna THORNBERRY-              Visit Date: 01/09/2023 10:36 am              Donna Mcknight ---------------------------------------------------------------------- Performed By  Attending:        Lin Landsman      Referred By:      [facility]/Triage                    MD  Performed By:     Percell [place]          [facility] ---------------------------------------------------------------------- Orders  #  Description                           Code        Ordered By  1  [place] MFM OB LIMITED                     76815.01    Donna Mcknight  2  [place] MFM FETAL BPP WO NON               82956.21    Donna Mcknight     STRESS ----------------------------------------------------------------------  #  Order #                     Accession #                Episode #  1  [id number]                   [id number]                 [id number]  2  [id number]                   [id number]

## 2023-01-12 ENCOUNTER — Encounter: Payer: Self-pay | Admitting: Women's Health

## 2023-01-23 ENCOUNTER — Ambulatory Visit (INDEPENDENT_AMBULATORY_CARE_PROVIDER_SITE_OTHER): Payer: BC Managed Care – PPO | Admitting: Women's Health

## 2023-01-23 ENCOUNTER — Encounter: Payer: Self-pay | Admitting: Women's Health

## 2023-01-23 VITALS — BP 116/75 | HR 105 | Wt 196.0 lb

## 2023-01-23 DIAGNOSIS — Z3480 Encounter for supervision of other normal pregnancy, unspecified trimester: Secondary | ICD-10-CM

## 2023-01-23 DIAGNOSIS — Z3483 Encounter for supervision of other normal pregnancy, third trimester: Secondary | ICD-10-CM

## 2023-01-23 NOTE — Progress Notes (Signed)
LOW-RISK PREGNANCY VISIT Patient name: Donna Mcknight MRN [id number]  Date of birth: 1995/09/03 Chief Complaint:   Routine Prenatal Visit (Pain stomach area)  History of Present Illness:   Donna Mcknight is a 27 y.o. G73P1001 female at [redacted]w[redacted]d with an Estimated Date of Delivery: 03/02/23 being seen today for ongoing management of a low-risk pregnancy.   Today she reports congestion/cough x few days-daughter had respiratory infection recently. Neg covid/flu. Occ abdominal pains, maybe contractions. Feels like cervix was dilating the other night while walking around in [employer]. Some liquidy vaginal d/c- same she has been having for months, no change, no other sx. Admitted 9/30-10/2 for vag bleeding, s/p BMZ x 2. No VB since d/c.  Contractions: Irregular.  .  Movement: Present. denies leaking of fluid.     08/25/2022    9:46 AM 06/30/2022    8:46 AM 08/23/2021    8:38 AM 06/21/2021    4:28 PM 01/08/2021    9:07 AM  Depression screen PHQ 2/9  Decreased Interest 0 1 0 1 0  Down, Depressed, Hopeless 0 1 0 1 0  PHQ - 2 Score 0 2 0 2 0  Altered sleeping 3 0 0 1 0  Tired, decreased energy 1 3 1 3 2   Change in appetite 3 2 0 0 1  Feeling bad or failure about yourself  0 1 0 1 0  Trouble concentrating 0 1 0 1 0  Moving slowly or fidgety/restless 0 0 0 0 0  Suicidal thoughts 0 1 0 0 0  PHQ-9 Score 7 10 1 8 3         08/25/2022    9:46 AM 06/30/2022    8:46 AM 08/23/2021    8:54 AM 06/21/2021    4:30 PM  GAD 7 : Generalized Anxiety Score  Nervous, Anxious, on Edge 2 1 1 1   Control/stop worrying 1 2 1 1   Worry too much - different things 1 1 0 1  Trouble relaxing 1 1 1  0  Restless 0 0 0 0  Easily annoyed or irritable 2 1 1 1   Afraid - awful might happen 2 0 1 0  Total GAD 7 Score 9 6 5 4       Review of Systems:   Pertinent items are noted in HPI Denies abnormal vaginal discharge w/ itching/odor/irritation, headaches, visual changes, shortness of breath, chest pain,  abdominal pain, severe nausea/vomiting, or problems with urination or bowel movements unless otherwise stated above. Pertinent History Reviewed:  Reviewed past medical,surgical, social, obstetrical and family history.  Reviewed problem list, medications and allergies. Physical Assessment:   Vitals:   01/23/23 0836  BP: 116/75  Pulse: (!) 105  Weight: 196 lb (88.9 kg)  Body mass index is 32.12 kg/m.        Physical Examination:   General appearance: Well appearing, and in no distress  Mental status: Alert, oriented to person, place, and time  Skin: Warm & dry  Cardiovascular: Normal heart rate noted  Respiratory: Normal respiratory effort, no distress  Abdomen: Soft, gravid, nontender  Pelvic: Cervical exam deferred         Extremities: Edema: None  Fetal Status: Fetal Heart Rate (bpm): 136 Fundal Height: 34 cm Movement: Present    Chaperone: Sherri Kaywood   No results found for this or any previous visit (from the past 24 hour(s)).  Assessment & Plan:  1) Low-risk pregnancy G2P1001 at [redacted]w[redacted]d with an Estimated Date of Delivery: 03/02/23   2)  S/P admit for vb, s/p BMZ x 2, no bleeding since  3) Viral URI> gave printed relief measures    Meds: No orders of the defined types were placed in this encounter.  Labs/procedures today: none  Plan:  Continue routine obstetrical care  Next visit: prefers will be in person for cultures     Reviewed: Preterm labor symptoms and general obstetric precautions including but not limited to vaginal bleeding, contractions, leaking of fluid and fetal movement were reviewed in detail with the patient.  All questions were answered. Does have home bp cuff. Office bp cuff given: not applicable. Check bp weekly, let us know if consistently >140 and/or >90.  Follow-up: Return for As scheduled.  Future Appointments  Date Time Provider Department Center  02/06/2023  8:30 AM Cheral Marker, [place] [facility]  02/13/2023  8:30 AM Cheral Marker, CNM [facility]  02/20/2023  8:30 AM Jacklyn Shell, CNM [facility]  02/27/2023  8:50 AM Cheral Marker, CNM [facility]  03/06/2023  8:50 AM [facility] NURSE [facility]  03/06/2023  9:10 AM Myna Hidalgo, DO [facility]    No orders of the defined types were placed in this encounter.  Cheral Marker CNM, [facility] 01/23/2023 9:09 AM

## 2023-01-23 NOTE — Patient Instructions (Addendum)
[place], thank you for choosing our office today! We appreciate the opportunity to meet your healthcare needs. You may receive a short survey by mail, e-mail, or through [employer]. If you are happy with your care we would appreciate if you could take just a few minutes to complete the survey questions. We read all of your comments and take your feedback very seriously. Thank you again for choosing our office.  Center for [employer] Healthcare Team at [facility]  [facility] at [facility] ([address] [place], [place] [address]) Entrance C, located off of [address] valet parking   With viral infections symptoms typically last 3-7 days but can stretch out to 2-3 weeks.  Unfortunately, antibiotics are not helpful for viral infections. Humidifier and saline nasal spray for nasal congestion Regular robitussin, cough drops for cough Warm salt water gargles for sore throat Mucinex with lots of water to help you cough up the mucous in your chest if needed Drink plenty of fluids and stay hydrated! Wash your hands frequently. Call if you are not improving by 7-10 days.     CLASSES: Go to [email or web address] to register for classes (childbirth, breastfeeding, waterbirth, infant CPR, daddy bootcamp, etc.)  Call the office [phone]) or go to [facility] if: You begin to have strong, frequent contractions Your water breaks.  Sometimes it is a big gush of fluid, sometimes it is just a trickle that keeps getting your panties wet or running down your legs You have vaginal bleeding.  It is normal to have a small amount of spotting if your cervix was checked.  You don't feel your baby moving like normal.  If you don't, get you something to eat and drink and lay down and focus on feeling your baby move.   If your baby is still not moving like normal, you should call the office or go to [facility].  Call the office [phone]) or go to [facility] hospital for these signs of  pre-eclampsia: Severe headache that does not go away with Tylenol Visual changes- seeing spots, double, blurred vision Pain under your right breast or upper abdomen that does not go away with Tums or heartburn medicine Nausea and/or vomiting Severe swelling in your hands, feet, and face   Tdap Vaccine It is recommended that you get the Tdap vaccine during the third trimester of EACH pregnancy to help protect your baby from getting pertussis (whooping cough) 27-36 weeks is the BEST time to do this so that you can pass the protection on to your baby. During pregnancy is better than after pregnancy, but if you are unable to get it during pregnancy it will be offered at the hospital.  You can get this vaccine with [place], at the health department, your family doctor, or some local pharmacies Everyone who will be around your baby should also be up-to-date on their vaccines before the baby comes. Adults (who are not pregnant) only need 1 dose of Tdap during adulthood.   [facility] Pediatricians/Family Doctors [facility]): [address] Colette Ribas, [phone]           [facility]: [address], [phone]                [facility] Medicine [facility]): [address], [phone] (call to ask if accepting patients) [facility] Department: [address], [place], [phone]    [facility] Pediatricians/Family Doctors [facility]): [phone] [address]  [place], [address], [phone] [facility]: [address] [place], [phone] [facility]: [address], [phone]  [facility] Doctors  [facility]): [phone] [facility]: [address], [phone]   [facility] Doctors [facility]: [address], [phone]  [facility] Doctors  [facility]: [phone], [phone]  Home  Blood Pressure Monitoring for Patients   Your provider has recommended that you check your blood pressure (BP) at least once a week at home. If you do not have a blood pressure cuff at home, one will be provided for you. Contact your provider if you have not received your monitor within 1 week.   Helpful Tips for Accurate Home Blood Pressure Checks  Don't smoke, exercise, or drink caffeine 30 minutes before checking your BP Use the restroom before checking your BP (a full bladder can raise your pressure) Relax in a comfortable upright chair Feet on the ground Left arm resting comfortably on a flat surface at the level of your heart Legs uncrossed Back supported Sit quietly and don't talk Place the cuff on your bare arm Adjust snuggly, so that only two fingertips can fit between your skin and the top of the cuff Check 2 readings separated by at least one minute Keep a log of your BP readings For a visual, please reference this diagram: http://ccnc.care/bpdiagram  Provider Name: [facility]     Phone: [phone]  Zone 1: ALL CLEAR  Continue to monitor your symptoms:  BP reading is less than 140 (top number) or less than 90 (bottom number)  No right upper stomach pain No headaches or seeing spots No feeling nauseated or throwing up No swelling in face and hands  Zone 2: CAUTION Call your doctor's office for any of the following:  BP reading is greater than 140 (top number) or greater than 90 (bottom number)  Stomach pain under your ribs in the middle or right side Headaches or seeing spots Feeling nauseated or throwing up Swelling in face and hands  Zone 3: EMERGENCY  Seek immediate medical care if you have any of the following:  BP reading is greater than160 (top number) or greater than 110 (bottom number) Severe headaches not improving with Tylenol Serious difficulty catching your breath Any worsening symptoms from Zone 2  Preterm Labor and Birth Information  The  normal length of a pregnancy is 39-41 weeks. Preterm labor is when labor starts before 37 completed weeks of pregnancy. What are the risk factors for preterm labor? Preterm labor is more likely to occur in women who: Have certain infections during pregnancy such as a bladder infection, sexually transmitted infection, or infection inside the uterus (chorioamnionitis). Have a shorter-than-normal cervix. Have gone into preterm labor before. Have had surgery on their cervix. Are younger than age 19 or older than age 59. Are African American. Are pregnant with twins or multiple babies (multiple gestation). Take street drugs or smoke while pregnant. Do not gain enough weight while pregnant. Became pregnant shortly after having been pregnant. What are the symptoms of preterm labor? Symptoms of preterm labor include: Cramps similar to those that can happen during a menstrual period. The cramps may happen with diarrhea. Pain in the abdomen or lower back. Regular uterine contractions that may feel like tightening of the abdomen. A feeling of increased pressure in the pelvis. Increased watery or bloody mucus discharge from the vagina. Water breaking (ruptured  amniotic sac). Why is it important to recognize signs of preterm labor? It is important to recognize signs of preterm labor because babies who are born prematurely may not be fully developed. This can put them at an increased risk for: Long-term (chronic) heart and lung problems. Difficulty immediately after birth with regulating body systems, including blood sugar, body temperature, heart rate, and breathing rate. Bleeding in the brain. Cerebral palsy. Learning difficulties. Death. These risks are highest for babies who are born before 34 weeks of pregnancy. How is preterm labor treated? Treatment depends on the length of your pregnancy, your condition, and the health of your baby. It may involve: Having a stitch (suture) placed in your  cervix to prevent your cervix from opening too early (cerclage). Taking or being given medicines, such as: Hormone medicines. These may be given early in pregnancy to help support the pregnancy. Medicine to stop contractions. Medicines to help mature the baby's lungs. These may be prescribed if the risk of delivery is high. Medicines to prevent your baby from developing cerebral palsy. If the labor happens before 34 weeks of pregnancy, you may need to stay in the hospital. What should I do if I think I am in preterm labor? If you think that you are going into preterm labor, call your health care provider right away. How can I prevent preterm labor in future pregnancies? To increase your chance of having a full-term pregnancy: Do not use any tobacco products, such as cigarettes, chewing tobacco, and e-cigarettes. If you need help quitting, ask your health care provider. Do not use street drugs or medicines that have not been prescribed to you during your pregnancy. Talk with your health care provider before taking any herbal supplements, even if you have been taking them regularly. Make sure you gain a healthy amount of weight during your pregnancy. Watch for infection. If you think that you might have an infection, get it checked right away. Make sure to tell your health care provider if you have gone into preterm labor before. This information is not intended to replace advice given to you by your health care provider. Make sure you discuss any questions you have with your health care provider. Document Revised: 07/20/2018 Document Reviewed: 08/19/2015 Elsevier Patient Education  2020 [employer].

## 2023-01-27 ENCOUNTER — Encounter: Payer: Self-pay | Admitting: Women's Health

## 2023-01-27 MED ORDER — PREDNISONE 10 MG PO TABS: ORAL_TABLET | ORAL | 0 refills | Status: DC

## 2023-01-27 MED ORDER — AZITHROMYCIN 250 MG PO TABS: ORAL_TABLET | ORAL | 0 refills | Status: DC

## 2023-02-06 ENCOUNTER — Encounter: Payer: Self-pay | Admitting: Women's Health

## 2023-02-06 ENCOUNTER — Ambulatory Visit (INDEPENDENT_AMBULATORY_CARE_PROVIDER_SITE_OTHER): Payer: BC Managed Care – PPO | Admitting: Women's Health

## 2023-02-06 ENCOUNTER — Other Ambulatory Visit (HOSPITAL_COMMUNITY)
Admission: RE | Admit: 2023-02-06 | Discharge: 2023-02-06 | Disposition: A | Discharge status: Home / Self Care | Payer: BC Managed Care – PPO | Source: Ambulatory Visit | Attending: Women's Health | Admitting: Women's Health

## 2023-02-06 VITALS — BP 125/77 | HR 88 | Wt 197.0 lb

## 2023-02-06 DIAGNOSIS — Z3483 Encounter for supervision of other normal pregnancy, third trimester: Secondary | ICD-10-CM

## 2023-02-06 DIAGNOSIS — Z3A36 36 weeks gestation of pregnancy: Secondary | ICD-10-CM

## 2023-02-06 NOTE — Progress Notes (Signed)
LOW-RISK PREGNANCY VISIT Patient name: Donna Mcknight MRN [id number]  Date of birth: 03-01-1996 Chief Complaint:   Routine Prenatal Visit  History of Present Illness:   Donna Mcknight is a 27 y.o. G40P1001 female at [redacted]w[redacted]d with an Estimated Date of Delivery: 03/02/23 being seen today for ongoing management of a low-risk pregnancy.   Today she reports  irregular contractions, headaches-bp was wnl at home, didn't take any meds, abdominal pain and leg cramps . Has had sex twice, no bleeding! Contractions: Irritability. Vag. Bleeding: None.  Movement: Present. denies leaking of fluid.     08/25/2022    9:46 AM 06/30/2022    8:46 AM 08/23/2021    8:38 AM 06/21/2021    4:28 PM 01/08/2021    9:07 AM  Depression screen PHQ 2/9  Decreased Interest 0 1 0 1 0  Down, Depressed, Hopeless 0 1 0 1 0  PHQ - 2 Score 0 2 0 2 0  Altered sleeping 3 0 0 1 0  Tired, decreased energy 1 3 1 3 2   Change in appetite 3 2 0 0 1  Feeling bad or failure about yourself  0 1 0 1 0  Trouble concentrating 0 1 0 1 0  Moving slowly or fidgety/restless 0 0 0 0 0  Suicidal thoughts 0 1 0 0 0  PHQ-9 Score 7 10 1 8 3         08/25/2022    9:46 AM 06/30/2022    8:46 AM 08/23/2021    8:54 AM 06/21/2021    4:30 PM  GAD 7 : Generalized Anxiety Score  Nervous, Anxious, on Edge 2 1 1 1   Control/stop worrying 1 2 1 1   Worry too much - different things 1 1 0 1  Trouble relaxing 1 1 1  0  Restless 0 0 0 0  Easily annoyed or irritable 2 1 1 1   Afraid - awful might happen 2 0 1 0  Total GAD 7 Score 9 6 5 4       Review of Systems:   Pertinent items are noted in HPI Denies abnormal vaginal discharge w/ itching/odor/irritation, headaches, visual changes, shortness of breath, chest pain, abdominal pain, severe nausea/vomiting, or problems with urination or bowel movements unless otherwise stated above. Pertinent History Reviewed:  Reviewed past medical,surgical, social, obstetrical and family history.   Reviewed problem list, medications and allergies. Physical Assessment:   Vitals:   02/06/23 0833  BP: 125/77  Pulse: 88  Weight: 197 lb (89.4 kg)  Body mass index is 32.28 kg/m.        Physical Examination:   General appearance: Well appearing, and in no distress  Mental status: Alert, oriented to person, place, and time  Skin: Warm & dry  Cardiovascular: Normal heart rate noted  Respiratory: Normal respiratory effort, no distress  Abdomen: Soft, gravid, nontender  Pelvic: Cervical exam performed  Dilation: 1.5 Effacement (%): Thick Station: Ballotable  Extremities: Edema: Trace  Fetal Status: Fetal Heart Rate (bpm): 143 Fundal Height: 36 cm Movement: Present Presentation: Vertex  Chaperone: Faith Rogue   No results found for this or any previous visit (from the past 24 hour(s)).  Assessment & Plan:  1) Low-risk pregnancy G2P1001 at [redacted]w[redacted]d with an Estimated Date of Delivery: 03/02/23   2) Headaches, gave printed prevention/relief measures   3) Leg cramps> discussed prevention/relief measures  4) Admit at 32wks for vb> s/p BMZ, no bleeding since   Meds: No orders of the defined types were placed  in this encounter.  Labs/procedures today: GBS, GC/CT, and SVE  Plan:  Continue routine obstetrical care  Next visit: prefers in person    Reviewed: Preterm labor symptoms and general obstetric precautions including but not limited to vaginal bleeding, contractions, leaking of fluid and fetal movement were reviewed in detail with the patient.  All questions were answered. Does have home bp cuff. Office bp cuff given: not applicable. Check bp weekly, let us know if consistently >140 and/or >90.  Follow-up: Return for As scheduled.  Future Appointments  Date Time Provider Department Center  02/13/2023  8:30 AM Cheral Marker, CNM [facility]  02/20/2023  8:30 AM Jacklyn Shell, CNM [facility]  02/27/2023  8:50 AM Cheral Marker, CNM [facility]   03/06/2023  8:50 AM [facility] NURSE [facility]  03/06/2023  9:10 AM Myna Hidalgo, DO [facility]    Orders Placed This Encounter  Procedures   Culture, beta strep (group b only)   Cheral Marker CNM, [facility] 02/06/2023 8:52 AM

## 2023-02-06 NOTE — Patient Instructions (Addendum)
Rolando, thank you for choosing our office today! We appreciate the opportunity to meet your healthcare needs. You may receive a short survey by mail, e-mail, or through [employer]. If you are happy with your care we would appreciate if you could take just a few minutes to complete the survey questions. We read all of your comments and take your feedback very seriously. Thank you again for choosing our office.  Center for [employer] Team at [facility]  [facility] at [facility] ([address] [place], [place] [address]) Entrance C, located off of [address] Free 24/7 valet parking   CLASSES: Go to [email or web address] to register for classes (childbirth, breastfeeding, waterbirth, infant CPR, daddy bootcamp, etc.)  Call the office [phone]) or go to [facility] if: You begin to have strong, frequent contractions Your water breaks.  Sometimes it is a big gush of fluid, sometimes it is just a trickle that keeps getting your panties wet or running down your legs You have vaginal bleeding.  It is normal to have a small amount of spotting if your cervix was checked.  You don't feel your baby moving like normal.  If you don't, get you something to eat and drink and lay down and focus on feeling your baby move.   If your baby is still not moving like normal, you should call the office or go to [facility].  Call the office [phone]) or go to [facility] hospital for these signs of pre-eclampsia: Severe headache that does not go away with Tylenol Visual changes- seeing spots, double, blurred vision Pain under your right breast or upper abdomen that does not go away with Tums or heartburn medicine Nausea and/or vomiting Severe swelling in your hands, feet, and face   [facility] Pediatricians/Family Doctors [facility]): [address] Colette Ribas, [phone]           [facility]: [address], [phone]                 [facility]): [address], [phone] (call to ask if accepting patients) [facility] Department: [address] [place], [phone]    [facility] Pediatricians/Family Doctors [facility]): [address], [phone] [facility]: [address] [place], [phone] [facility]: [address], [phone]  [facility] Doctors  [facility]): [phone] [facility]: [address], [phone]   [facility] Doctors [facility]: [address] [phone]  [facility] Doctors  [facility]: [phone], [phone]  Home Blood Pressure Monitoring for Patients   Your provider has recommended that you check your blood pressure (BP) at least once a week at home. If you do not have a blood pressure cuff at home, one will be provided for you. Contact your provider if you have not received your monitor within 1 week.   Helpful Tips for Accurate Home Blood Pressure Checks  Don't smoke, exercise, or drink caffeine 30 minutes before checking your BP Use the restroom before checking your BP (a full bladder can raise your pressure) Relax in a comfortable upright chair Feet on the ground Left arm resting comfortably on a flat surface at the level of your heart Legs uncrossed Back supported Sit quietly and don't talk Place the cuff on your bare arm Adjust snuggly, so that only two fingertips  can fit between your skin and the top of the cuff Check 2 readings separated by at least one minute Keep a log of your BP readings For a visual, please reference this diagram: http://ccnc.care/bpdiagram  Provider Name: [facility]     Phone: [phone]  Zone 1: ALL CLEAR  Continue to monitor your symptoms:  BP reading is less than 140 (top number) or less than 90 (bottom number)  No right  upper stomach pain No headaches or seeing spots No feeling nauseated or throwing up No swelling in face and hands  Zone 2: CAUTION Call your doctor's office for any of the following:  BP reading is greater than 140 (top number) or greater than 90 (bottom number)  Stomach pain under your ribs in the middle or right side Headaches or seeing spots Feeling nauseated or throwing up Swelling in face and hands  Zone 3: EMERGENCY  Seek immediate medical care if you have any of the following:  BP reading is greater than160 (top number) or greater than 110 (bottom number) Severe headaches not improving with Tylenol Serious difficulty catching your breath Any worsening symptoms from Zone 2   Braxton Hicks Contractions Contractions of the uterus can occur throughout pregnancy, but they are not always a sign that you are in labor. You may have practice contractions called Braxton Hicks contractions. These false labor contractions are sometimes confused with true labor. What are [name] contractions? Braxton Hicks contractions are tightening movements that occur in the muscles of the uterus before labor. Unlike true labor contractions, these contractions do not result in opening (dilation) and thinning of the cervix. Toward the end of pregnancy (32-34 weeks), Braxton Hicks contractions can happen more often and may become stronger. These contractions are sometimes difficult to tell apart from true labor because they can be very uncomfortable. You should not feel embarrassed if you go to the hospital with false labor. Sometimes, the only way to tell if you are in true labor is for your health care provider to look for changes in the cervix. The health care provider will do a physical exam and may monitor your contractions. If you are not in true labor, the exam should show that your cervix is not dilating and your water has not broken. If there are no other health problems associated with your  pregnancy, it is completely safe for you to be sent home with false labor. You may continue to have Braxton Hicks contractions until you go into true labor. How to tell the difference between true labor and false labor True labor Contractions last 30-70 seconds. Contractions become very regular. Discomfort is usually felt in the top of the uterus, and it spreads to the lower abdomen and low back. Contractions do not go away with walking. Contractions usually become more intense and increase in frequency. The cervix dilates and gets thinner. False labor Contractions are usually shorter and not as strong as true labor contractions. Contractions are usually irregular. Contractions are often felt in the front of the lower abdomen and in the groin. Contractions may go away when you walk around or change positions while lying down. Contractions get weaker and are shorter-lasting as time goes on. The cervix usually does not dilate or become thin. Follow these instructions at home:  Take over-the-counter and prescription medicines only as told by your health care provider. Keep up with your usual exercises and follow other instructions from your health care provider. Eat and drink lightly if you think  you are going into labor. If Braxton Hicks contractions are making you uncomfortable: Change your position from lying down or resting to walking, or change from walking to resting. Sit and rest in a tub of warm water. Drink enough fluid to keep your urine pale yellow. Dehydration may cause these contractions. Do slow and deep breathing several times an hour. Keep all follow-up prenatal visits as told by your health care provider. This is important. Contact a health care provider if: You have a fever. You have continuous pain in your abdomen. Get help right away if: Your contractions become stronger, more regular, and closer together. You have fluid leaking or gushing from your vagina. You pass  blood-tinged mucus (bloody show). You have bleeding from your vagina. You have low back pain that you never had before. You feel your baby's head pushing down and causing pelvic pressure. Your baby is not moving inside you as much as it used to. Summary Contractions that occur before labor are called Braxton Hicks contractions, false labor, or practice contractions. Braxton Hicks contractions are usually shorter, weaker, farther apart, and less regular than true labor contractions. True labor contractions usually become progressively stronger and regular, and they become more frequent. Manage discomfort from [facility] contractions by changing position, resting in a warm bath, drinking plenty of water, or practicing deep breathing. This information is not intended to replace advice given to you by your health care provider. Make sure you discuss any questions you have with your health care provider. Document Revised: 03/10/2017 Document Reviewed: 08/11/2016 Elsevier Patient Education  2020 [employer].  For Headaches:  Stay well hydrated, drink enough water so that your urine is clear, sometimes if you are dehydrated you can get headaches Eat small frequent meals and snacks, sometimes if you are hungry you can get headaches Sometimes you get headaches during pregnancy from the pregnancy hormones You can try tylenol (1-2 regular strength 325mg  or 1-2 extra strength 500mg ) as directed on the box. The least amount of medication that works is best.  Cool compresses (cool wet washcloth or ice pack) to area of head that is hurting You can also try drinking a caffeinated drink to see if this will help If not helping, try below:  For Prevention of Headaches/Migraines: CoQ10 100mg  three times daily Vitamin B2 400mg  daily Magnesium Oxide 400-600mg  daily  Foods to alleviate migraines:  1) dark leafy greens 2) avocado 3) tuna 4) salmon  5) beans and legumes  Foods to avoid: 1) Excessive  (or irregular timing) coffee 3) aged cheeses 4) chocolate 5) citrus fruits 6) aspartame and other artifical sweeteners 7) yeast 8) MSG (in processed foods) 9) processed and cured meats 10) nuts and certain seeds 11) chicken livers and other organ meats 12) dairy products like buttermilk, sour cream, and yogurt 13) dried fruits like dates, figs, and raisins 14) garlic 15) onions 16) potato chips 17) pickled foods like olives and sauerkraut 18) some fresh fruits like ripe banana, papaya, red plums, raspberries, kiwi, pineapple 19) tomato-based products  Recommend to keep a migraine diary: rate daily the severity of your headache (1-10) and what foods you eat that day to help determine patterns.   If You Get a Bad Headache/Migraine: Benadryl 25mg   Magnesium Oxide 1 large Gatorade 2 extra strength Tylenol (1,000mg  total) 1 cup coffee or Coke      If this doesn't help please call us @ [phone]

## 2023-02-07 LAB — CERVICOVAGINAL ANCILLARY ONLY
Chlamydia: NEGATIVE
Comment: NEGATIVE
Comment: NORMAL
Neisseria Gonorrhea: NEGATIVE

## 2023-02-10 LAB — CULTURE, BETA STREP (GROUP B ONLY): Strep Gp B Culture: NEGATIVE

## 2023-02-13 ENCOUNTER — Ambulatory Visit: Payer: BC Managed Care – PPO | Admitting: Women's Health

## 2023-02-13 ENCOUNTER — Ambulatory Visit (INDEPENDENT_AMBULATORY_CARE_PROVIDER_SITE_OTHER): Payer: BC Managed Care – PPO | Admitting: Women's Health

## 2023-02-13 ENCOUNTER — Encounter: Payer: Self-pay | Admitting: Women's Health

## 2023-02-13 ENCOUNTER — Telehealth (HOSPITAL_COMMUNITY): Payer: Self-pay | Admitting: *Deleted

## 2023-02-13 ENCOUNTER — Encounter (HOSPITAL_COMMUNITY): Payer: Self-pay

## 2023-02-13 ENCOUNTER — Telehealth: Payer: Self-pay

## 2023-02-13 VITALS — BP 117/75 | HR 77 | Wt 201.0 lb

## 2023-02-13 VITALS — BP 133/76 | HR 90

## 2023-02-13 DIAGNOSIS — Z3480 Encounter for supervision of other normal pregnancy, unspecified trimester: Secondary | ICD-10-CM

## 2023-02-13 DIAGNOSIS — Z3483 Encounter for supervision of other normal pregnancy, third trimester: Secondary | ICD-10-CM

## 2023-02-13 DIAGNOSIS — O321XX0 Maternal care for breech presentation, not applicable or unspecified: Secondary | ICD-10-CM

## 2023-02-13 DIAGNOSIS — Z3A37 37 weeks gestation of pregnancy: Secondary | ICD-10-CM

## 2023-02-13 NOTE — Patient Instructions (Addendum)
Donna Mcknight, thank you for choosing our office today! We appreciate the opportunity to meet your healthcare needs. You may receive a short survey by mail, e-mail, or through [employer]. If you are happy with your care we would appreciate if you could take just a few minutes to complete the survey questions. We read all of your comments and take your feedback very seriously. Thank you again for choosing our office.  Center for [employer] Team at [facility]  [facility] at [facility] ([address] [place], [place] [address]) Entrance C, located off of [address] Free 24/7 valet parking   External Cephalic Version  So your baby is breech? Here are some things you can try to encourage baby to turn head down: [email or web address] (different positions to try and more information) [facility] [place] [phone]) $120 new patients, do not file insurances [facility] [phone]) Webster's technique- has to be done by a chiropractor certified in the technique Dive into the deep end of a swimming pool, or do a hand-stand in a swimming pool (the water should be deep enough that your belly is completely covered)   CLASSES: Go to [email or web address] to register for classes (childbirth, breastfeeding, waterbirth, infant CPR, daddy bootcamp, etc.)  Call the office [phone]) or go to [facility] if: You begin to have strong, frequent contractions Your water breaks.  Sometimes it is a big gush of fluid, sometimes it is just a trickle that keeps getting your panties wet or running down your legs You have vaginal bleeding.  It is normal to have a small amount of spotting if your cervix was checked.  You don't feel your baby moving like normal.  If you don't, get you something to eat and drink and lay down and focus on feeling your baby move.   If your baby is still not moving like normal, you should call the office or go to [facility].  Call the office [phone]) or go to [facility] hospital for these signs of pre-eclampsia: Severe headache that does not go away with Tylenol Visual changes- seeing spots, double, blurred vision Pain under your right breast or upper abdomen that does not go away with Tums or heartburn medicine Nausea and/or vomiting Severe swelling in your hands, feet, and face   [facility] Pediatricians/Family Doctors [facility]): [address] Colette Mcknight, [phone]           [facility]: [address], [phone]                [facility]): [address], [phone] (call to ask if accepting patients) [facility] Department: [address], [place], [phone]    [facility] Pediatricians/Family Doctors [facility]): [address], [phone] [facility]: [address] [place], [phone] [facility]: [address], [phone]  [facility] Doctors  [facility]): [phone] [facility]: [address] [phone]   [facility] Doctors [facility]: [address] [phone]  [facility] Doctors  [facility]: [phone], [phone]  Home Blood Pressure Monitoring for Patients   Your provider has recommended that you check your blood pressure (BP) at least once a week at home. If you do not have a blood pressure cuff at home, one will be provided for you. Contact your provider if you have  not received your monitor within 1 week.   Helpful Tips for Accurate Home Blood Pressure Checks  Don't smoke, exercise, or drink caffeine 30 minutes before checking your BP Use the restroom before checking your BP (a full bladder can raise your pressure) Relax in a comfortable upright chair Feet on the ground Left arm resting comfortably on a  flat surface at the level of your heart Legs uncrossed Back supported Sit quietly and don't talk Place the cuff on your bare arm Adjust snuggly, so that only two fingertips can fit between your skin and the top of the cuff Check 2 readings separated by at least one minute Keep a log of your BP readings For a visual, please reference this diagram: http://ccnc.care/bpdiagram  Provider Name: [facility]     Phone: [phone]  Zone 1: ALL CLEAR  Continue to monitor your symptoms:  BP reading is less than 140 (top number) or less than 90 (bottom number)  No right upper stomach pain No headaches or seeing spots No feeling nauseated or throwing up No swelling in face and hands  Zone 2: CAUTION Call your doctor's office for any of the following:  BP reading is greater than 140 (top number) or greater than 90 (bottom number)  Stomach pain under your ribs in the middle or right side Headaches or seeing spots Feeling nauseated or throwing up Swelling in face and hands  Zone 3: EMERGENCY  Seek immediate medical care if you have any of the following:  BP reading is greater than160 (top number) or greater than 110 (bottom number) Severe headaches not improving with Tylenol Serious difficulty catching your breath Any worsening symptoms from Zone 2   Braxton Hicks Contractions Contractions of the uterus can occur throughout pregnancy, but they are not always a sign that you are in labor. You may have practice contractions called Braxton Hicks contractions. These false labor contractions are sometimes confused with true labor. What are Donna Mcknight contractions? Braxton Hicks contractions are tightening movements that occur in the muscles of the uterus before labor. Unlike true labor contractions, these contractions do not result in opening (dilation) and thinning of the cervix. Toward the end of pregnancy (32-34 weeks), Braxton Hicks contractions can happen more often and may become  stronger. These contractions are sometimes difficult to tell apart from true labor because they can be very uncomfortable. You should not feel embarrassed if you go to the hospital with false labor. Sometimes, the only way to tell if you are in true labor is for your health care provider to look for changes in the cervix. The health care provider will do a physical exam and may monitor your contractions. If you are not in true labor, the exam should show that your cervix is not dilating and your water has not broken. If there are no other health problems associated with your pregnancy, it is completely safe for you to be sent home with false labor. You may continue to have Braxton Hicks contractions until you go into true labor. How to tell the difference between true labor and false labor True labor Contractions last 30-70 seconds. Contractions become very regular. Discomfort is usually felt in the top of the uterus, and it spreads to the lower abdomen and low back. Contractions do not go away with walking. Contractions usually become more intense and increase in frequency. The cervix dilates and gets thinner. False labor Contractions are usually shorter and not as strong as true labor contractions. Contractions are  usually irregular. Contractions are often felt in the front of the lower abdomen and in the groin. Contractions may go away when you walk around or change positions while lying down. Contractions get weaker and are shorter-lasting as time goes on. The cervix usually does not dilate or become thin. Follow these instructions at home:  Take over-the-counter and prescription medicines only as told by your health care provider. Keep up with your usual exercises and follow other instructions from your health care provider. Eat and drink lightly if you think you are going into labor. If Braxton Hicks contractions are making you uncomfortable: Change your position from lying down or  resting to walking, or change from walking to resting. Sit and rest in a tub of warm water. Drink enough fluid to keep your urine pale yellow. Dehydration may cause these contractions. Do slow and deep breathing several times an hour. Keep all follow-up prenatal visits as told by your health care provider. This is important. Contact a health care provider if: You have a fever. You have continuous pain in your abdomen. Get help right away if: Your contractions become stronger, more regular, and closer together. You have fluid leaking or gushing from your vagina. You pass blood-tinged mucus (bloody show). You have bleeding from your vagina. You have low back pain that you never had before. You feel your baby's head pushing down and causing pelvic pressure. Your baby is not moving inside you as much as it used to. Summary Contractions that occur before labor are called Braxton Hicks contractions, false labor, or practice contractions. Braxton Hicks contractions are usually shorter, weaker, farther apart, and less regular than true labor contractions. True labor contractions usually become progressively stronger and regular, and they become more frequent. Manage discomfort from [facility] contractions by changing position, resting in a warm bath, drinking plenty of water, or practicing deep breathing. This information is not intended to replace advice given to you by your health care provider. Make sure you discuss any questions you have with your health care provider. Document Revised: 03/10/2017 Document Reviewed: 08/11/2016 Elsevier Patient Education  2020 [employer].

## 2023-02-13 NOTE — Progress Notes (Signed)
Work-in LOW-RISK PREGNANCY VISIT Patient name: Donna Mcknight MRN [id number]  Date of birth: February 07, 1996 Chief Complaint:   Routine Prenatal Visit (Seem this morning)  History of Present Illness:   Donna Mcknight is a 27 y.o. G85P1001 female at [redacted]w[redacted]d with an Estimated Date of Delivery: 03/02/23 being seen today for ongoing management of a low-risk pregnancy.   Today she is being seen as a work-in for report of gush of fluid while sitting on toilet @ 1310, none since. Felt a lot of pressure right before it happened. Some contractions after appt earlier, have eased off. No vb. Good fm. Denies abnormal discharge, itching/odor/irritation.       08/25/2022    9:46 AM 06/30/2022    8:46 AM 08/23/2021    8:38 AM 06/21/2021    4:28 PM 01/08/2021    9:07 AM  Depression screen PHQ 2/9  Decreased Interest 0 1 0 1 0  Down, Depressed, Hopeless 0 1 0 1 0  PHQ - 2 Score 0 2 0 2 0  Altered sleeping 3 0 0 1 0  Tired, decreased energy 1 3 1 3 2   Change in appetite 3 2 0 0 1  Feeling bad or failure about yourself  0 1 0 1 0  Trouble concentrating 0 1 0 1 0  Moving slowly or fidgety/restless 0 0 0 0 0  Suicidal thoughts 0 1 0 0 0  PHQ-9 Score 7 10 1 8 3         08/25/2022    9:46 AM 06/30/2022    8:46 AM 08/23/2021    8:54 AM 06/21/2021    4:30 PM  GAD 7 : Generalized Anxiety Score  Nervous, Anxious, on Edge 2 1 1 1   Control/stop worrying 1 2 1 1   Worry too much - different things 1 1 0 1  Trouble relaxing 1 1 1  0  Restless 0 0 0 0  Easily annoyed or irritable 2 1 1 1   Afraid - awful might happen 2 0 1 0  Total GAD 7 Score 9 6 5 4       Review of Systems:   Pertinent items are noted in HPI Denies abnormal vaginal discharge w/ itching/odor/irritation, headaches, visual changes, shortness of breath, chest pain, abdominal pain, severe nausea/vomiting, or problems with urination or bowel movements unless otherwise stated above. Pertinent History Reviewed:  Reviewed past  medical,surgical, social, obstetrical and family history.  Reviewed problem list, medications and allergies. Physical Assessment:   Vitals:   02/13/23 1446  BP: 133/76  Pulse: 90  There is no height or weight on file to calculate BMI.        Physical Examination:   General appearance: Well appearing, and in no distress  Mental status: Alert, oriented to person, place, and time  Skin: Warm & dry  Cardiovascular: Normal heart rate noted  Respiratory: Normal respiratory effort, no distress  Abdomen: Soft, gravid, nontender  Pelvic:  SSE: no pooling, no change w/ valsalva, fern & nitrazine neg. SVE 3/th/ballotable, breech   Dilation: 3 Effacement (%): Thick Station: Ballotable (SVE per pt request)  Extremities: Edema: None  Fetal Status: Fetal Heart Rate (bpm): 160   Movement: Present Presentation: Complete Breech  Chaperone: Peggy Dones   No results found for this or any previous visit (from the past 24 hour(s)).  Assessment & Plan:  1) Low-risk pregnancy G2P1001 at [redacted]w[redacted]d with an Estimated Date of Delivery: 03/02/23   2) Leaking fluid, earlier, no evidence of ROM,  reviewed warning s/s, reasons to seek care again  3) Complete breech> ECV 11/6 as scheduled   Meds: No orders of the defined types were placed in this encounter.  Labs/procedures today: spec exam, SVE, and fern and nitrazine  Plan:  Continue routine obstetrical care  Next visit: prefers in person    Reviewed: Term labor symptoms and general obstetric precautions including but not limited to vaginal bleeding, contractions, leaking of fluid and fetal movement were reviewed in detail with the patient.  All questions were answered. Does have home bp cuff. Office bp cuff given: not applicable. Check bp weekly, let us know if consistently >140 and/or >90.  Follow-up: Return for As scheduled.  Future Appointments  Date Time Provider Department Center  02/15/2023  8:00 AM [facility] SCHED ROOM [facility] None  02/20/2023  8:30 AM  Jacklyn Shell, CNM [facility]  02/27/2023  8:50 AM Cheral Marker, CNM [facility]  03/06/2023  8:50 AM [facility] NURSE [facility]  03/06/2023  9:10 AM Myna Hidalgo, DO [facility]    No orders of the defined types were placed in this encounter.  Cheral Marker CNM, [facility] 02/13/2023 2:49 PM

## 2023-02-13 NOTE — Patient Instructions (Signed)
Heath, thank you for choosing our office today! We appreciate the opportunity to meet your healthcare needs. You may receive a short survey by mail, e-mail, or through [employer]. If you are happy with your care we would appreciate if you could take just a few minutes to complete the survey questions. We read all of your comments and take your feedback very seriously. Thank you again for choosing our office.  Center for [employer] Team at [facility]  [facility] at [facility] ([address] [place], [place] [address]) Entrance C, located off of [address] Free 24/7 valet parking   CLASSES: Go to [email or web address] to register for classes (childbirth, breastfeeding, waterbirth, infant CPR, daddy bootcamp, etc.)  Call the office [phone]) or go to [facility] if: You begin to have strong, frequent contractions Your water breaks.  Sometimes it is a big gush of fluid, sometimes it is just a trickle that keeps getting your panties wet or running down your legs You have vaginal bleeding.  It is normal to have a small amount of spotting if your cervix was checked.  You don't feel your baby moving like normal.  If you don't, get you something to eat and drink and lay down and focus on feeling your baby move.   If your baby is still not moving like normal, you should call the office or go to [facility].  Call the office [phone]) or go to [facility] hospital for these signs of pre-eclampsia: Severe headache that does not go away with Tylenol Visual changes- seeing spots, double, blurred vision Pain under your right breast or upper abdomen that does not go away with Tums or heartburn medicine Nausea and/or vomiting Severe swelling in your hands, feet, and face   [facility] Pediatricians/Family Doctors [facility]): [address] Colette Ribas, [phone]           [facility]: [address], [phone]                 [facility]): [address], [phone] (call to ask if accepting patients) [facility] Department: [address] [place], [phone]    [facility] Pediatricians/Family Doctors [facility]): [address], [phone] [facility]: [address] [place], [phone] [facility]: [address], [phone]  [facility] Doctors  [facility]): [phone] [facility]: [address] [phone]   [facility] Doctors [facility]: [address] [phone]  [facility] Doctors  [facility]: [phone], [phone]  Home Blood Pressure Monitoring for Patients   Your provider has recommended that you check your blood pressure (BP) at least once a week at home. If you do not have a blood pressure cuff at home, one will be provided for you. Contact your provider if you have not received your monitor within 1 week.   Helpful Tips for Accurate Home Blood Pressure Checks  Don't smoke, exercise, or drink caffeine 30 minutes before checking your BP Use the restroom before checking your BP (a full bladder can raise your pressure) Relax in a comfortable upright chair Feet on the ground Left arm resting comfortably on a flat surface at the level of your heart Legs uncrossed Back supported Sit quietly and don't talk Place the cuff on your bare arm Adjust snuggly, so that only two fingertips  can fit between your skin and the top of the cuff Check 2 readings separated by at least one minute Keep a log of your BP readings For a visual, please reference this diagram: http://ccnc.care/bpdiagram  Provider Name: [facility]     Phone: [phone]  Zone 1: ALL CLEAR  Continue to monitor your symptoms:  BP reading is less than 140 (top number) or less than 90 (bottom number)  No right  upper stomach pain No headaches or seeing spots No feeling nauseated or throwing up No swelling in face and hands  Zone 2: CAUTION Call your doctor's office for any of the following:  BP reading is greater than 140 (top number) or greater than 90 (bottom number)  Stomach pain under your ribs in the middle or right side Headaches or seeing spots Feeling nauseated or throwing up Swelling in face and hands  Zone 3: EMERGENCY  Seek immediate medical care if you have any of the following:  BP reading is greater than160 (top number) or greater than 110 (bottom number) Severe headaches not improving with Tylenol Serious difficulty catching your breath Any worsening symptoms from Zone 2   Braxton Hicks Contractions Contractions of the uterus can occur throughout pregnancy, but they are not always a sign that you are in labor. You may have practice contractions called Braxton Hicks contractions. These false labor contractions are sometimes confused with true labor. What are [place] contractions? Braxton Hicks contractions are tightening movements that occur in the muscles of the uterus before labor. Unlike true labor contractions, these contractions do not result in opening (dilation) and thinning of the cervix. Toward the end of pregnancy (32-34 weeks), Braxton Hicks contractions can happen more often and may become stronger. These contractions are sometimes difficult to tell apart from true labor because they can be very uncomfortable. You should not feel embarrassed if you go to the hospital with false labor. Sometimes, the only way to tell if you are in true labor is for your health care provider to look for changes in the cervix. The health care provider will do a physical exam and may monitor your contractions. If you are not in true labor, the exam should show that your cervix is not dilating and your water has not broken. If there are no other health problems associated with your  pregnancy, it is completely safe for you to be sent home with false labor. You may continue to have Braxton Hicks contractions until you go into true labor. How to tell the difference between true labor and false labor True labor Contractions last 30-70 seconds. Contractions become very regular. Discomfort is usually felt in the top of the uterus, and it spreads to the lower abdomen and low back. Contractions do not go away with walking. Contractions usually become more intense and increase in frequency. The cervix dilates and gets thinner. False labor Contractions are usually shorter and not as strong as true labor contractions. Contractions are usually irregular. Contractions are often felt in the front of the lower abdomen and in the groin. Contractions may go away when you walk around or change positions while lying down. Contractions get weaker and are shorter-lasting as time goes on. The cervix usually does not dilate or become thin. Follow these instructions at home:  Take over-the-counter and prescription medicines only as told by your health care provider. Keep up with your usual exercises and follow other instructions from your health care provider. Eat and drink lightly if you think  you are going into labor. If Braxton Hicks contractions are making you uncomfortable: Change your position from lying down or resting to walking, or change from walking to resting. Sit and rest in a tub of warm water. Drink enough fluid to keep your urine pale yellow. Dehydration may cause these contractions. Do slow and deep breathing several times an hour. Keep all follow-up prenatal visits as told by your health care provider. This is important. Contact a health care provider if: You have a fever. You have continuous pain in your abdomen. Get help right away if: Your contractions become stronger, more regular, and closer together. You have fluid leaking or gushing from your vagina. You pass  blood-tinged mucus (bloody show). You have bleeding from your vagina. You have low back pain that you never had before. You feel your babys head pushing down and causing pelvic pressure. Your baby is not moving inside you as much as it used to. Summary Contractions that occur before labor are called Braxton Hicks contractions, false labor, or practice contractions. Braxton Hicks contractions are usually shorter, weaker, farther apart, and less regular than true labor contractions. True labor contractions usually become progressively stronger and regular, and they become more frequent. Manage discomfort from [facility] contractions by changing position, resting in a warm bath, drinking plenty of water, or practicing deep breathing. This information is not intended to replace advice given to you by your health care provider. Make sure you discuss any questions you have with your health care provider. Document Revised: 03/10/2017 Document Reviewed: 08/11/2016 Elsevier Patient Education  [place].

## 2023-02-13 NOTE — Progress Notes (Signed)
LOW-RISK PREGNANCY VISIT Patient name: Donna Mcknight MRN [id number]  Date of birth: 1995/12/19 Chief Complaint:   Routine Prenatal Visit  History of Present Illness:   Donna Mcknight is a 27 y.o. G36P1001 female at [redacted]w[redacted]d with an Estimated Date of Delivery: 03/02/23 being seen today for ongoing management of a low-risk pregnancy.   Today she reports  leaking fluid couple of time yesterday, none since 5pm yesterday . Contractions: Not present. Vag. Bleeding: None.  Movement: Present. reports leaking of fluid.     08/25/2022    9:46 AM 06/30/2022    8:46 AM 08/23/2021    8:38 AM 06/21/2021    4:28 PM 01/08/2021    9:07 AM  Depression screen PHQ 2/9  Decreased Interest 0 1 0 1 0  Down, Depressed, Hopeless 0 1 0 1 0  PHQ - 2 Score 0 2 0 2 0  Altered sleeping 3 0 0 1 0  Tired, decreased energy 1 3 1 3 2   Change in appetite 3 2 0 0 1  Feeling bad or failure about yourself  0 1 0 1 0  Trouble concentrating 0 1 0 1 0  Moving slowly or fidgety/restless 0 0 0 0 0  Suicidal thoughts 0 1 0 0 0  PHQ-9 Score 7 10 1 8 3         08/25/2022    9:46 AM 06/30/2022    8:46 AM 08/23/2021    8:54 AM 06/21/2021    4:30 PM  GAD 7 : Generalized Anxiety Score  Nervous, Anxious, on Edge 2 1 1 1   Control/stop worrying 1 2 1 1   Worry too much - different things 1 1 0 1  Trouble relaxing 1 1 1  0  Restless 0 0 0 0  Easily annoyed or irritable 2 1 1 1   Afraid - awful might happen 2 0 1 0  Total GAD 7 Score 9 6 5 4       Review of Systems:   Pertinent items are noted in HPI Denies abnormal vaginal discharge w/ itching/odor/irritation, headaches, visual changes, shortness of breath, chest pain, abdominal pain, severe nausea/vomiting, or problems with urination or bowel movements unless otherwise stated above. Pertinent History Reviewed:  Reviewed past medical,surgical, social, obstetrical and family history.  Reviewed problem list, medications and allergies. Physical Assessment:    Vitals:   02/13/23 0838  BP: 117/75  Pulse: 77  Weight: 201 lb (91.2 kg)  Body mass index is 32.94 kg/m.        Physical Examination:   General appearance: Well appearing, and in no distress  Mental status: Alert, oriented to person, place, and time  Skin: Warm & dry  Cardiovascular: Normal heart rate noted  Respiratory: Normal respiratory effort, no distress  Abdomen: Soft, gravid, nontender  Pelvic:  SSE: cx visually closed, no pooling, no change w/ valsalva   Dilation: 3 Effacement (%): Thick Station: Ballotable, no vtx felt, feels like in RUQ, will wait on u/s to confirm  Extremities: Edema: None  Fetal Status: Fetal Heart Rate (bpm): 139 Fundal Height: 37 cm Movement: Present Presentation: Complete Breech verified by informal u/s by Pam (states she felt a big turn last night or this am)  Chaperone: Peggy Dones   No results found for this or any previous visit (from the past 24 hour(s)).  Assessment & Plan:  1) Low-risk pregnancy G2P1001 at [redacted]w[redacted]d with an Estimated Date of Delivery: 03/02/23   2) Admit @ 32wks for vb, s/p BMZ,  none since  3) Complete breech presentation> discussed risks/benefits ECV vs PCS, wants to try ECV, scheduled for 11/6 0800, be there @ 0700, NPO after midnight. Form sent and orders placed. Also gave tips to help encourage baby to turn prior.    Meds: No orders of the defined types were placed in this encounter.  Labs/procedures today: spec exam and SVE  Plan:  Continue routine obstetrical care  Next visit: prefers in person    Reviewed: Term labor symptoms and general obstetric precautions including but not limited to vaginal bleeding, contractions, leaking of fluid and fetal movement were reviewed in detail with the patient.  All questions were answered. Does have home bp cuff. Office bp cuff given: not applicable. Check bp weekly, let us know if consistently >140 and/or >90.  Follow-up: Return for As scheduled.  Future Appointments  Date Time  Provider Department Center  02/20/2023  8:30 AM Jacklyn Shell, CNM [facility]  02/27/2023  8:50 AM Cheral Marker, CNM [facility]  03/06/2023  8:50 AM [facility] NURSE [facility]  03/06/2023  9:10 AM Myna Hidalgo, DO [facility]    No orders of the defined types were placed in this encounter.  Cheral Marker CNM, [facility] 02/13/2023 9:22 AM

## 2023-02-13 NOTE — Telephone Encounter (Signed)
Preadmission screen  

## 2023-02-13 NOTE — Telephone Encounter (Signed)
Telephone call from patient stating that she was seen by Donna Mcknight CNM today and is 3cm. Patient states she started leaking fluid at 1310pm today and is not bleeding. Baby is active. Contractions are about 10 minutes apart. Patient wants to be seen in office to see if she is ruptured before going to the hospital. Patient is breech presentation. Reviewed this with Donna Mcknight CNM. Patient to come in as a work in if not sure that she is ruptured or not.

## 2023-02-14 ENCOUNTER — Encounter (HOSPITAL_COMMUNITY): Payer: Self-pay | Admitting: *Deleted

## 2023-02-14 ENCOUNTER — Telehealth (HOSPITAL_COMMUNITY): Payer: Self-pay | Admitting: *Deleted

## 2023-02-14 NOTE — Telephone Encounter (Signed)
Preadmission screen  

## 2023-02-15 ENCOUNTER — Encounter (HOSPITAL_COMMUNITY): Payer: Self-pay

## 2023-02-15 ENCOUNTER — Observation Stay (HOSPITAL_COMMUNITY)
Admission: AD | Admit: 2023-02-15 | Discharge: 2023-02-15 | Disposition: A | Discharge status: Home / Self Care | Payer: BC Managed Care – PPO | Attending: Family Medicine | Admitting: Family Medicine

## 2023-02-15 ENCOUNTER — Observation Stay (HOSPITAL_COMMUNITY): Payer: BC Managed Care – PPO

## 2023-02-15 DIAGNOSIS — Z3A37 37 weeks gestation of pregnancy: Secondary | ICD-10-CM | POA: Insufficient documentation

## 2023-02-15 DIAGNOSIS — O09893 Supervision of other high risk pregnancies, third trimester: Secondary | ICD-10-CM

## 2023-02-15 DIAGNOSIS — O321XX1 Maternal care for breech presentation, fetus 1: Secondary | ICD-10-CM | POA: Diagnosis not present

## 2023-02-15 DIAGNOSIS — F418 Other specified anxiety disorders: Secondary | ICD-10-CM | POA: Diagnosis not present

## 2023-02-15 DIAGNOSIS — Z3490 Encounter for supervision of normal pregnancy, unspecified, unspecified trimester: Secondary | ICD-10-CM

## 2023-02-15 DIAGNOSIS — Z3483 Encounter for supervision of other normal pregnancy, third trimester: Secondary | ICD-10-CM

## 2023-02-15 DIAGNOSIS — O321XX0 Maternal care for breech presentation, not applicable or unspecified: Secondary | ICD-10-CM

## 2023-02-15 DIAGNOSIS — O99343 Other mental disorders complicating pregnancy, third trimester: Secondary | ICD-10-CM | POA: Insufficient documentation

## 2023-02-15 HISTORY — DX: Maternal care for breech presentation, not applicable or unspecified: O32.1XX0

## 2023-02-15 LAB — TYPE AND SCREEN
ABO/RH(D): O POS
Antibody Screen: NEGATIVE

## 2023-02-15 MED ORDER — TERBUTALINE SULFATE 1 MG/ML IJ SOLN
0.2500 mg | Freq: Once | INTRAMUSCULAR | Status: AC
  Administered 2023-02-15: 0.25 mg via SUBCUTANEOUS

## 2023-02-15 MED ORDER — TERBUTALINE SULFATE 1 MG/ML IJ SOLN
INTRAMUSCULAR | Status: AC
  Filled 2023-02-15: qty 1

## 2023-02-15 NOTE — Discharge Summary (Signed)
Postpartum Discharge Summary     Patient Name: Donna Mcknight DOB: Jan 21, 1996 MRN: [id number]  Date of admission: 02/15/2023 Delivery date:This patient has no babies on file. Delivering provider: This patient has no babies on file. Date of discharge: 02/15/2023  Admitting diagnosis: Breech presentation of fetus [O32.1XX0] Intrauterine pregnancy: [redacted]w[redacted]d     Secondary diagnosis:  Principal Problem:   Breech presentation of fetus  Additional problems: none    Discharge diagnosis:  Successful external cephalic version                                               Post partum procedures: n/a Augmentation: n/a Complications: None  Hospital course:  Patient presented to L&D PACU for scheduled ECV due to breech presentation of the fetus. After informed consent and a reactive NST was obtained and terbutaline was administered, one attempt at ECV was performed with successful version of the infant. Patient was monitored for 30 minutes after the procedure and had a reactive NST during that time.   Immunizations received: Immunization History  Administered Date(s) Administered   Tdap 08/15/2006, 01/08/2021, 12/05/2022   Varicella 10/27/1995, 08/15/2006    Physical exam  Vitals:   02/15/23 0749  BP: 124/81  Pulse: 84  Temp: 98.1 F (36.7 C)  TempSrc: Oral   Physical Exam Vitals reviewed.  Constitutional:      General: She is not in acute distress.    Appearance: She is well-developed. She is not diaphoretic.  Cardiovascular:     Rate and Rhythm: Normal rate and regular rhythm.     Heart sounds: Normal heart sounds. No murmur heard. Pulmonary:     Effort: Pulmonary effort is normal. No respiratory distress.     Breath sounds: Normal breath sounds. No wheezing or rales.  Abdominal:     General: Bowel sounds are normal. There is no distension.     Palpations: Abdomen is soft.     Tenderness: There is no abdominal tenderness. There is no guarding or rebound.  Skin:     General: Skin is warm and dry.  Neurological:     Mental Status: She is alert.     Coordination: Coordination normal.      Labs: Lab Results  Component Value Date   WBC 9.4 01/09/2023   HGB 11.3 (L) 01/09/2023   HCT 34.3 (L) 01/09/2023   MCV 88.6 01/09/2023   PLT 268 01/09/2023      Latest Ref Rng & Units 06/30/2022    9:31 AM  CMP  Glucose 70 - 99 mg/dL 90   BUN 6 - 20 mg/dL 12   Creatinine 8.54 - 1.00 mg/dL 6.27   Sodium 035 - 009 mmol/L 137   Potassium 3.5 - 5.2 mmol/L 4.2   Chloride 96 - 106 mmol/L 100   CO2 20 - 29 mmol/L 22   Calcium 8.7 - 10.2 mg/dL 9.8   Total Protein 6.0 - 8.5 g/dL 7.3   Total Bilirubin 0.0 - 1.2 mg/dL 0.5   Alkaline Phos 44 - 121 IU/L 92   AST 0 - 40 IU/L 14   ALT 0 - 32 IU/L 11    Edinburgh Score:    08/23/2021    8:55 AM  Edinburgh Postnatal Depression Scale Screening Tool  I have been able to laugh and see the funny side of things. 0  I have looked forward  with enjoyment to things. 0  I have blamed myself unnecessarily when things went wrong. 1  I have been anxious or worried for no good reason. 2  I have felt scared or panicky for no good reason. 2  Things have been getting on top of me. 0  I have been so unhappy that I have had difficulty sleeping. 0  I have felt sad or miserable. 0  I have been so unhappy that I have been crying. 1  The thought of harming myself has occurred to me. 0  Edinburgh Postnatal Depression Scale Total 6   No data recorded  After visit meds:  Allergies as of 02/15/2023       Reactions   Nickel    Other reaction(s): edema   Other Hives, Swelling   Nickel        Medication List     TAKE these medications    acetaminophen 325 MG tablet Commonly known as: TYLENOL Take 650 mg by mouth every 6 (six) hours as needed.   ferrous sulfate 325 (65 FE) MG tablet Take 1 tablet (325 mg total) by mouth every other day.   omeprazole 40 MG capsule Commonly known as: PRILOSEC Take 1 capsule (40 mg  total) by mouth daily.   PRENATAL VITAMIN PO Take by mouth.         Discharge home in stable condition  Future Appointments: Future Appointments  Date Time Provider Department Center  02/20/2023  8:30 AM Jacklyn Shell, CNM [facility]  02/27/2023  8:50 AM Cheral Marker, CNM [facility]  03/06/2023  8:50 AM [facility] N[facility]SE [facility]  03/06/2023  9:10 AM Myna Hidalgo, DO [facility]    02/15/2023 Venora Maples, MD

## 2023-02-15 NOTE — H&P (Signed)
LABOR AND DELIVERY ADMISSION HISTORY AND PHYSICAL NOTE  Donna Mcknight is a 27 y.o. female G2P1001 with IUP at [redacted]w[redacted]d presenting for scheduled ECV.   Patient reports the fetal movement as active. Patient reports uterine contraction  activity as none. Patient reports  vaginal bleeding as none. Patient describes fluid per vagina as None.   She plans on bottle feeding feeding. Her contraception plan is: IUD.  Prenatal History/Complications: PNC at [facility]  Sono:  @[redacted]w[redacted]d , CWD, normal anatomy, cephalic presentation, posterior placenta, 49%ile, EFW 314g  Pregnancy complications:  Patient Active Problem List   Diagnosis Date Noted   Vaginal bleeding in pregnancy, third trimester 01/09/2023   [redacted] weeks gestation of pregnancy 01/09/2023   History of gestational diabetes in prior pregnancy, currently pregnant 08/25/2022   Encounter for supervision of normal pregnancy, antepartum 08/04/2022   Anxiety and depression 06/30/2022   History of postpartum hemorrhage 04/18/2021   Depression with anxiety 10/14/2020    Past Medical History: Past Medical History:  Diagnosis Date   Anemia    as teenager   Anxiety    Depression    Gestational diabetes    first preg   History of postpartum hemorrhage, currently pregnant    Mental disorder    anxiety, depression   UTI (urinary tract infection)     Past Surgical History: Past Surgical History:  Procedure Laterality Date   MOUTH SURGERY      Obstetrical History: OB History     Gravida  2   Para  1   Term  1   Preterm  0   AB  0   Living  1      SAB  0   IAB  0   Ectopic  0   Multiple  0   Live Births  1           Social History: Social History   Socioeconomic History   Marital status: Married    Spouse name: Not on file   Number of children: 0   Years of education: Not on file   Highest education level: Not on file  Occupational History   Not on file  Tobacco Use   Smoking status: Never    Smokeless tobacco: Never   Tobacco comments:    Quit 2014  Vaping Use   Vaping status: Former  Substance and Sexual Activity   Alcohol use: Not Currently    Alcohol/week: 10.0 standard drinks of alcohol    Types: 10 Glasses of wine per week    Comment: almost everyday   Drug use: Not Currently    Comment: prior to preg   Sexual activity: Yes  Other Topics Concern   Not on file  Social History Narrative   Not on file   Social Determinants of Health   Financial Resource Strain: Low Risk  (06/30/2022)   Overall Financial Resource Strain (CARDIA)    Difficulty of Paying Living Expenses: Not hard at all  Food Insecurity: No Food Insecurity (02/15/2023)   Hunger Vital Sign    Worried About Running Out of Food in the Last Year: Never true    Ran Out of Food in the Last Year: Never true  Transportation Needs: No Transportation Needs (02/15/2023)   PRAPARE - Administrator, Civil Service (Medical): No    Lack of Transportation (Non-Medical): No  Physical Activity: Insufficiently Active (06/30/2022)   Exercise Vital Sign    Days of Exercise per Week: 1 day  Minutes of Exercise per Session: 10 min  Stress: Stress Concern Present (06/30/2022)   [employer] of Occupational Health - Occupational Stress Questionnaire    Feeling of Stress : To some extent  Social Connections: Socially Isolated (06/30/2022)   Social Connection and Isolation Panel [NHANES]    Frequency of Communication with Friends and Family: Once a week    Frequency of Social Gatherings with Friends and Family: Never    Attends Religious Services: Never    Database administrator or Organizations: No    Attends Engineer, structural: Never    Marital Status: Married    Family History: Family History  Problem Relation Age of Onset   Cancer Maternal Grandmother    Diabetes Maternal Grandfather    Cirrhosis Father    Hypertension Mother    Fibroids Mother     Allergies: Allergies   Allergen Reactions   Nickel     Other reaction(s): edema   Other Hives and Swelling    Nickel      Medications Prior to Admission  Medication Sig Dispense Refill Last Dose   acetaminophen (TYLENOL) 325 MG tablet Take 650 mg by mouth every 6 (six) hours as needed.   02/14/2023   ferrous sulfate 325 (65 FE) MG tablet Take 1 tablet (325 mg total) by mouth every other day. 45 tablet 2 02/14/2023   omeprazole (PRILOSEC) 40 MG capsule Take 1 capsule (40 mg total) by mouth daily. 30 capsule 2 02/14/2023   Prenatal Vit-Fe Fumarate-FA (PRENATAL VITAMIN PO) Take by mouth.        Review of Systems  All systems reviewed and negative except as stated in HPI  Physical Exam BP 124/81 (BP Location: Right Arm)   Pulse 84   Temp 98.1 F (36.7 C) (Oral)   LMP 05/26/2022 (Approximate)   Physical Exam Vitals reviewed.  Constitutional:      General: She is not in acute distress.    Appearance: She is well-developed. She is not diaphoretic.  Cardiovascular:     Rate and Rhythm: Normal rate and regular rhythm.     Heart sounds: Normal heart sounds. No murmur heard. Pulmonary:     Effort: Pulmonary effort is normal. No respiratory distress.     Breath sounds: Normal breath sounds. No wheezing or rales.  Abdominal:     General: Bowel sounds are normal. There is no distension.     Palpations: Abdomen is soft.     Tenderness: There is no abdominal tenderness. There is no guarding or rebound.  Skin:    General: Skin is warm and dry.  Neurological:     Mental Status: She is alert.     Coordination: Coordination normal.      Presentation: complete breech by bedside US  Fetal monitoring: Baseline: 140 bpm, Variability: Good {> 6 bpm), Accelerations: Reactive, and Decelerations: Absent Uterine activity: None     Prenatal labs: ABO, Rh: --/--/PENDING (11/06 0805) Antibody: PENDING (11/06 0805) Rubella: 1.45 (04/25 0839) RPR: Non Reactive (08/26 0840)  HBsAg: Negative (04/25 0839)  HIV:  Non Reactive (08/26 0840)  GC/Chlamydia:  Neisseria Gonorrhea  Date Value Ref Range Status  02/06/2023 Negative  Final   Chlamydia  Date Value Ref Range Status  02/06/2023 Negative  Final   GBS: Negative/-- (10/28 1330)    Prenatal Transfer Tool  Maternal Diabetes: No Genetic Screening: Normal Maternal Ultrasounds/Referrals: Normal Fetal Ultrasounds or other Referrals:  None Maternal Substance Abuse:  No Significant Maternal Medications:  Meds include:  Other: PPI Significant Maternal Lab Results: Group B Strep negative  Results for orders placed or performed during the hospital encounter of 02/15/23 (from the past 24 hour(s))  Type and screen   Collection Time: 02/15/23  8:05 AM  Result Value Ref Range   ABO/RH(D) PENDING    Antibody Screen PENDING    Sample Expiration      02/18/2023,2359 Performed at [facility], [address], [place], [place] [address]     Assessment: EMERI ESTILL is a 27 y.o. G2P1001 at [redacted]w[redacted]d here for scheduled ECV.  #Breech presentation of fetus:  Discussed management options including ECV, planned cesarean, and planned vaginal breech delivery. She elects for ECV. Discussed risks of failure (roughly 50%) and fetal distress. Regarding fetal distress discussed possible etiologies including placental disruption and cord accident, and also discussed that this occurs about 1% of the time and would require delivery by emergency cesarean. Discussed this would likely require general anesthesia and reviewed risks of infection, bleeding that my require blood transfusion, thromboembolism, damage to surrounding organs and structures, effects on future pregnancies.   Finally also discussed that in case of unsuccessful ECV, can consider options of planned cesarean vs planned vaginal breech delivery but did not go into risks and benefits.   #Pain: none #FHT: Category I #GBS/ID: Negative #MOF: bottle feeding #MOC: IUD #Circ: n/a #Rh status:  O positive, rhogam not indicated  Venora Maples, MD/MPH Attending Family Medicine Physician, [facility] [place], [facility]  02/15/2023, 8:51 AM

## 2023-02-15 NOTE — Procedures (Addendum)
ECV Procedure Note Patient was not given a spinal prior to attempting the procedure. NST was reactive prior to the procedure.  Patient was given 0.25 mg of SQ terbutaline prior to initiating the procedure. ECV was attempted 1 times. Procedure was started at 0903 and complete at 0904. Procedure was successful.

## 2023-02-16 ENCOUNTER — Encounter: Payer: Self-pay | Admitting: Women's Health

## 2023-02-17 ENCOUNTER — Ambulatory Visit (INDEPENDENT_AMBULATORY_CARE_PROVIDER_SITE_OTHER): Payer: BC Managed Care – PPO | Admitting: Obstetrics & Gynecology

## 2023-02-17 ENCOUNTER — Encounter: Payer: Self-pay | Admitting: Women's Health

## 2023-02-17 VITALS — BP 134/77 | HR 85 | Wt 199.4 lb

## 2023-02-17 DIAGNOSIS — Z3483 Encounter for supervision of other normal pregnancy, third trimester: Secondary | ICD-10-CM

## 2023-02-17 DIAGNOSIS — Z3480 Encounter for supervision of other normal pregnancy, unspecified trimester: Secondary | ICD-10-CM

## 2023-02-17 DIAGNOSIS — Z3A38 38 weeks gestation of pregnancy: Secondary | ICD-10-CM

## 2023-02-17 NOTE — Progress Notes (Signed)
   LOW-RISK PREGNANCY VISIT Patient name: Donna Mcknight MRN [id number]  Date of birth: 20-Feb-1996 Chief Complaint:   Routine Prenatal Visit (Wants [place] to check babies position, thinks may have turned again.)  History of Present Illness:   Donna Mcknight is a 27 y.o. G46P1001 female at [redacted]w[redacted]d with an Estimated Date of Delivery: 03/02/23 being seen today for ongoing management of a low-risk pregnancy.   Pt presented due to concern about the baby flipping back breech.     08/25/2022    9:46 AM 06/30/2022    8:46 AM 08/23/2021    8:38 AM 06/21/2021    4:28 PM 01/08/2021    9:07 AM  Depression screen PHQ 2/9  Decreased Interest 0 1 0 1 0  Down, Depressed, Hopeless 0 1 0 1 0  PHQ - 2 Score 0 2 0 2 0  Altered sleeping 3 0 0 1 0  Tired, decreased energy 1 3 1 3 2   Change in appetite 3 2 0 0 1  Feeling bad or failure about yourself  0 1 0 1 0  Trouble concentrating 0 1 0 1 0  Moving slowly or fidgety/restless 0 0 0 0 0  Suicidal thoughts 0 1 0 0 0  PHQ-9 Score 7 10 1 8 3     Contractions: Not present.  .  Movement: Present. denies leaking of fluid. Review of Systems:   Pertinent items are noted in HPI Denies abnormal vaginal discharge w/ itching/odor/irritation, headaches, visual changes, shortness of breath, chest pain, abdominal pain, severe nausea/vomiting, or problems with urination or bowel movements unless otherwise stated above. Pertinent History Reviewed:  Reviewed past medical,surgical, social, obstetrical and family history.  Reviewed problem list, medications and allergies.  Physical Assessment:   Vitals:   02/17/23 0851  BP: 134/77  Pulse: 85  Weight: 199 lb 6.4 oz (90.4 kg)  Body mass index is 32.68 kg/m.        Physical Examination:   General appearance: Well appearing, and in no distress  Mental status: Alert, oriented to person, place, and time  Skin: Warm & dry  Respiratory: Normal respiratory effort, no distress  Abdomen: Soft, gravid,  nontender  Pelvic: Cervical exam deferred         Extremities:    Psych:  mood and affect appropriate  Fetal Status:     Movement: Present    BSUS: vertex, FHT: 150bpm  Chaperone: n/a    No results found for this or any previous visit (from the past 24 hour(s)).   Assessment & Plan:  1) Low-risk pregnancy G2P1001 at [redacted]w[redacted]d with an Estimated Date of Delivery: 03/02/23   -continue routine OB care   Meds: No orders of the defined types were placed in this encounter.  Labs/procedures today: bedside US  Plan:  Continue routine obstetrical care  Next visit: prefers in person    Reviewed: Term labor symptoms and general obstetric precautions including but not limited to vaginal bleeding, contractions, leaking of fluid and fetal movement were reviewed in detail with the patient.  All questions were answered. Pt has home bp cuff. Check bp weekly, let us know if >140/90.   Follow-up: Return for as scheduled.  No orders of the defined types were placed in this encounter.   Myna Hidalgo, DO Attending Obstetrician & Gynecologist, [facility] for [employer], [facility]

## 2023-02-20 ENCOUNTER — Encounter: Payer: Self-pay | Admitting: Advanced Practice Midwife

## 2023-02-20 ENCOUNTER — Ambulatory Visit (INDEPENDENT_AMBULATORY_CARE_PROVIDER_SITE_OTHER): Payer: BC Managed Care – PPO | Admitting: Advanced Practice Midwife

## 2023-02-20 VITALS — BP 116/78 | HR 88 | Wt 200.0 lb

## 2023-02-20 DIAGNOSIS — Z3480 Encounter for supervision of other normal pregnancy, unspecified trimester: Secondary | ICD-10-CM

## 2023-02-20 DIAGNOSIS — Z3A38 38 weeks gestation of pregnancy: Secondary | ICD-10-CM

## 2023-02-20 DIAGNOSIS — Z3483 Encounter for supervision of other normal pregnancy, third trimester: Secondary | ICD-10-CM

## 2023-02-20 NOTE — Patient Instructions (Signed)

## 2023-02-20 NOTE — Progress Notes (Signed)
   LOW-RISK PREGNANCY VISIT Patient name: Donna Mcknight MRN [id number]  Date of birth: December 07, 1995 Chief Complaint:   Routine Prenatal Visit  History of Present Illness:   Donna Mcknight is a 27 y.o. G58P1001 female at [redacted]w[redacted]d with an Estimated Date of Delivery: 03/02/23 being seen today for ongoing management of a low-risk pregnancy.  Today she reports no complaints. Contractions: Irregular. Vag. Bleeding: None.  Movement: Present. denies leaking of fluid. Review of Systems:   Pertinent items are noted in HPI Denies abnormal vaginal discharge w/ itching/odor/irritation, headaches, visual changes, shortness of breath, chest pain, abdominal pain, severe nausea/vomiting, or problems with urination or bowel movements unless otherwise stated above. Pertinent History Reviewed:  Reviewed past medical,surgical, social, obstetrical and family history.  Reviewed problem list, medications and allergies. Physical Assessment:   Vitals:   02/20/23 0839  BP: 116/78  Pulse: 88  Weight: 200 lb (90.7 kg)  Body mass index is 32.78 kg/m.        Physical Examination:   General appearance: Well appearing, and in no distress  Mental status: Alert, oriented to person, place, and time  Skin: Warm & dry  Cardiovascular: Normal heart rate noted  Respiratory: Normal respiratory effort, no distress  Abdomen: Soft, gravid, nontender  Pelvic: Cervical exam performed  Dilation: 3 Effacement (%): Thick Station: -2  Extremities: Edema: None Chaperone:  Latisha Cresenzo   Fetal Status: Fetal Heart Rate (bpm): 144 Fundal Height: 38 cm Movement: Present Presentation: Vertex    No results found for this or any previous visit (from the past 24 hour(s)).  Assessment & Plan:    Pregnancy: G2P1001 at [redacted]w[redacted]d 1. [redacted] weeks gestation of pregnancy   2. Supervision of other normal pregnancy, antepartum Wants membrane sweep asap     Meds: No orders of the defined types were placed in this  encounter.  Labs/procedures today: none  Plan:  Continue routine obstetrical care  Next visit: prefers will be in person for membrane sweep     Reviewed: Term labor symptoms and general obstetric precautions including but not limited to vaginal bleeding, contractions, leaking of fluid and fetal movement were reviewed in detail with the patient.  All questions were answered. Has home bp cuff. . Check bp weekly, let us know if >140/90.   Follow-up: Return in about 3 days (around 02/23/2023) for LROB for membrane sweep.  Future Appointments  Date Time Provider Department Center  02/27/2023  8:50 AM Cheral Marker, CNM [facility]  03/06/2023  8:50 AM [facility]UR[facility]  03/06/2023  9:10 AM Myna Hidalgo, DO [facility]    No orders of the defined types were placed in this encounter.  Jacklyn Shell DNP, CNM 02/20/2023 8:53 AM

## 2023-02-23 ENCOUNTER — Ambulatory Visit: Payer: BC Managed Care – PPO | Admitting: Women's Health

## 2023-02-23 ENCOUNTER — Encounter: Payer: Self-pay | Admitting: Women's Health

## 2023-02-23 VITALS — BP 117/76 | HR 86 | Wt 201.4 lb

## 2023-02-23 DIAGNOSIS — Z3483 Encounter for supervision of other normal pregnancy, third trimester: Secondary | ICD-10-CM

## 2023-02-23 DIAGNOSIS — Z3A39 39 weeks gestation of pregnancy: Secondary | ICD-10-CM

## 2023-02-23 DIAGNOSIS — Z3480 Encounter for supervision of other normal pregnancy, unspecified trimester: Secondary | ICD-10-CM

## 2023-02-23 NOTE — Progress Notes (Signed)
LOW-RISK PREGNANCY VISIT Patient name: Donna Mcknight MRN [id number]  Date of birth: July 18, 1995 Chief Complaint:   Routine Prenatal Visit (Membrane sweep)  History of Present Illness:   Donna Mcknight is a 27 y.o. G24P1001 female at [redacted]w[redacted]d with an Estimated Date of Delivery: 03/02/23 being seen today for ongoing management of a low-risk pregnancy.   Today she reports no complaints. Wants membrane sweep. Contractions: Not present.  .  Movement: Present. denies leaking of fluid.     08/25/2022    9:46 AM 06/30/2022    8:46 AM 08/23/2021    8:38 AM 06/21/2021    4:28 PM 01/08/2021    9:07 AM  Depression screen PHQ 2/9  Decreased Interest 0 1 0 1 0  Down, Depressed, Hopeless 0 1 0 1 0  PHQ - 2 Score 0 2 0 2 0  Altered sleeping 3 0 0 1 0  Tired, decreased energy 1 3 1 3 2   Change in appetite 3 2 0 0 1  Feeling bad or failure about yourself  0 1 0 1 0  Trouble concentrating 0 1 0 1 0  Moving slowly or fidgety/restless 0 0 0 0 0  Suicidal thoughts 0 1 0 0 0  PHQ-9 Score 7 10 1 8 3         08/25/2022    9:46 AM 06/30/2022    8:46 AM 08/23/2021    8:54 AM 06/21/2021    4:30 PM  GAD 7 : Generalized Anxiety Score  Nervous, Anxious, on Edge 2 1 1 1   Control/stop worrying 1 2 1 1   Worry too much - different things 1 1 0 1  Trouble relaxing 1 1 1  0  Restless 0 0 0 0  Easily annoyed or irritable 2 1 1 1   Afraid - awful might happen 2 0 1 0  Total GAD 7 Score 9 6 5 4       Review of Systems:   Pertinent items are noted in HPI Denies abnormal vaginal discharge w/ itching/odor/irritation, headaches, visual changes, shortness of breath, chest pain, abdominal pain, severe nausea/vomiting, or problems with urination or bowel movements unless otherwise stated above. Pertinent History Reviewed:  Reviewed past medical,surgical, social, obstetrical and family history.  Reviewed problem list, medications and allergies. Physical Assessment:   Vitals:   02/23/23 0829  BP:  117/76  Pulse: 86  Weight: 201 lb 6.4 oz (91.4 kg)  Body mass index is 33 kg/m.        Physical Examination:   General appearance: Well appearing, and in no distress  Mental status: Alert, oriented to person, place, and time  Skin: Warm & dry  Cardiovascular: Normal heart rate noted  Respiratory: Normal respiratory effort, no distress  Abdomen: Soft, gravid, nontender  Pelvic: Cervical exam performed  Dilation: 3 Effacement (%): 50 Station: Ballotable, Offered membrane sweeping, discussed r/b- pt decided to proceed, so membranes swept.   Extremities: Edema: None  Fetal Status: Fetal Heart Rate (bpm): 142 Fundal Height: 38 cm Movement: Present Presentation: Vertex  Chaperone: Peggy Dones   No results found for this or any previous visit (from the past 24 hour(s)).  Assessment & Plan:  1) Low-risk pregnancy G2P1001 at [redacted]w[redacted]d with an Estimated Date of Delivery: 03/02/23   2) S/P successful ECV on 11/6, baby still vtx!  3) Admit @ 32wks for vb, s/p BMZ, none since    Meds: No orders of the defined types were placed in this encounter.  Labs/procedures today: SVE  and membrane sweep  Plan:  Continue routine obstetrical care  Next visit: prefers in person    Reviewed: Term labor symptoms and general obstetric precautions including but not limited to vaginal bleeding, contractions, leaking of fluid and fetal movement were reviewed in detail with the patient.  All questions were answered. Does have home bp cuff. Office bp cuff given: not applicable. Check bp weekly, let us know if consistently >140 and/or >90.  Follow-up: Return for As scheduled.  Future Appointments  Date Time Provider Department Center  02/27/2023  8:50 AM Cheral Marker, CNM [facility]  03/06/2023  8:50 AM [facility] NURSE [facility]  03/06/2023  9:10 AM Myna Hidalgo, DO [facility]    No orders of the defined types were placed in this encounter.  Cheral Marker CNM,  [facility] 02/23/2023 8:50 AM

## 2023-02-23 NOTE — Patient Instructions (Signed)
Heath, thank you for choosing our office today! We appreciate the opportunity to meet your healthcare needs. You may receive a short survey by mail, e-mail, or through [employer]. If you are happy with your care we would appreciate if you could take just a few minutes to complete the survey questions. We read all of your comments and take your feedback very seriously. Thank you again for choosing our office.  Center for [employer] Team at [facility]  [facility] at [facility] ([address] [place], [place] [address]) Entrance C, located off of [address] Free 24/7 valet parking   CLASSES: Go to [email or web address] to register for classes (childbirth, breastfeeding, waterbirth, infant CPR, daddy bootcamp, etc.)  Call the office [phone]) or go to [facility] if: You begin to have strong, frequent contractions Your water breaks.  Sometimes it is a big gush of fluid, sometimes it is just a trickle that keeps getting your panties wet or running down your legs You have vaginal bleeding.  It is normal to have a small amount of spotting if your cervix was checked.  You don't feel your baby moving like normal.  If you don't, get you something to eat and drink and lay down and focus on feeling your baby move.   If your baby is still not moving like normal, you should call the office or go to [facility].  Call the office [phone]) or go to [facility] hospital for these signs of pre-eclampsia: Severe headache that does not go away with Tylenol Visual changes- seeing spots, double, blurred vision Pain under your right breast or upper abdomen that does not go away with Tums or heartburn medicine Nausea and/or vomiting Severe swelling in your hands, feet, and face   [facility] Pediatricians/Family Doctors [facility]): [address] Colette Ribas, [phone]           [facility]: [address], [phone]                 [facility]): [address], [phone] (call to ask if accepting patients) [facility] Department: [address] [place], [phone]    [facility] Pediatricians/Family Doctors [facility]): [address], [phone] [facility]: [address] [place], [phone] [facility]: [address], [phone]  [facility] Doctors  [facility]): [phone] [facility]: [address] [phone]   [facility] Doctors [facility]: [address] [phone]  [facility] Doctors  [facility]: [phone], [phone]  Home Blood Pressure Monitoring for Patients   Your provider has recommended that you check your blood pressure (BP) at least once a week at home. If you do not have a blood pressure cuff at home, one will be provided for you. Contact your provider if you have not received your monitor within 1 week.   Helpful Tips for Accurate Home Blood Pressure Checks  Don't smoke, exercise, or drink caffeine 30 minutes before checking your BP Use the restroom before checking your BP (a full bladder can raise your pressure) Relax in a comfortable upright chair Feet on the ground Left arm resting comfortably on a flat surface at the level of your heart Legs uncrossed Back supported Sit quietly and don't talk Place the cuff on your bare arm Adjust snuggly, so that only two fingertips  can fit between your skin and the top of the cuff Check 2 readings separated by at least one minute Keep a log of your BP readings For a visual, please reference this diagram: http://ccnc.care/bpdiagram  Provider Name: [facility]     Phone: [phone]  Zone 1: ALL CLEAR  Continue to monitor your symptoms:  BP reading is less than 140 (top number) or less than 90 (bottom number)  No right  upper stomach pain No headaches or seeing spots No feeling nauseated or throwing up No swelling in face and hands  Zone 2: CAUTION Call your doctor's office for any of the following:  BP reading is greater than 140 (top number) or greater than 90 (bottom number)  Stomach pain under your ribs in the middle or right side Headaches or seeing spots Feeling nauseated or throwing up Swelling in face and hands  Zone 3: EMERGENCY  Seek immediate medical care if you have any of the following:  BP reading is greater than160 (top number) or greater than 110 (bottom number) Severe headaches not improving with Tylenol Serious difficulty catching your breath Any worsening symptoms from Zone 2   Braxton Hicks Contractions Contractions of the uterus can occur throughout pregnancy, but they are not always a sign that you are in labor. You may have practice contractions called Braxton Hicks contractions. These false labor contractions are sometimes confused with true labor. What are [place] contractions? Braxton Hicks contractions are tightening movements that occur in the muscles of the uterus before labor. Unlike true labor contractions, these contractions do not result in opening (dilation) and thinning of the cervix. Toward the end of pregnancy (32-34 weeks), Braxton Hicks contractions can happen more often and may become stronger. These contractions are sometimes difficult to tell apart from true labor because they can be very uncomfortable. You should not feel embarrassed if you go to the hospital with false labor. Sometimes, the only way to tell if you are in true labor is for your health care provider to look for changes in the cervix. The health care provider will do a physical exam and may monitor your contractions. If you are not in true labor, the exam should show that your cervix is not dilating and your water has not broken. If there are no other health problems associated with your  pregnancy, it is completely safe for you to be sent home with false labor. You may continue to have Braxton Hicks contractions until you go into true labor. How to tell the difference between true labor and false labor True labor Contractions last 30-70 seconds. Contractions become very regular. Discomfort is usually felt in the top of the uterus, and it spreads to the lower abdomen and low back. Contractions do not go away with walking. Contractions usually become more intense and increase in frequency. The cervix dilates and gets thinner. False labor Contractions are usually shorter and not as strong as true labor contractions. Contractions are usually irregular. Contractions are often felt in the front of the lower abdomen and in the groin. Contractions may go away when you walk around or change positions while lying down. Contractions get weaker and are shorter-lasting as time goes on. The cervix usually does not dilate or become thin. Follow these instructions at home:  Take over-the-counter and prescription medicines only as told by your health care provider. Keep up with your usual exercises and follow other instructions from your health care provider. Eat and drink lightly if you think  you are going into labor. If Braxton Hicks contractions are making you uncomfortable: Change your position from lying down or resting to walking, or change from walking to resting. Sit and rest in a tub of warm water. Drink enough fluid to keep your urine pale yellow. Dehydration may cause these contractions. Do slow and deep breathing several times an hour. Keep all follow-up prenatal visits as told by your health care provider. This is important. Contact a health care provider if: You have a fever. You have continuous pain in your abdomen. Get help right away if: Your contractions become stronger, more regular, and closer together. You have fluid leaking or gushing from your vagina. You pass  blood-tinged mucus (bloody show). You have bleeding from your vagina. You have low back pain that you never had before. You feel your babys head pushing down and causing pelvic pressure. Your baby is not moving inside you as much as it used to. Summary Contractions that occur before labor are called Braxton Hicks contractions, false labor, or practice contractions. Braxton Hicks contractions are usually shorter, weaker, farther apart, and less regular than true labor contractions. True labor contractions usually become progressively stronger and regular, and they become more frequent. Manage discomfort from [facility] contractions by changing position, resting in a warm bath, drinking plenty of water, or practicing deep breathing. This information is not intended to replace advice given to you by your health care provider. Make sure you discuss any questions you have with your health care provider. Document Revised: 03/10/2017 Document Reviewed: 08/11/2016 Elsevier Patient Education  [place].

## 2023-02-26 ENCOUNTER — Encounter (HOSPITAL_COMMUNITY): Payer: Self-pay | Admitting: Obstetrics & Gynecology

## 2023-02-26 ENCOUNTER — Inpatient Hospital Stay (HOSPITAL_COMMUNITY): Payer: BC Managed Care – PPO

## 2023-02-26 ENCOUNTER — Other Ambulatory Visit: Payer: Self-pay

## 2023-02-26 ENCOUNTER — Inpatient Hospital Stay (HOSPITAL_COMMUNITY)
Admission: AD | Admit: 2023-02-26 | Discharge: 2023-02-26 | Disposition: A | Discharge status: Home / Self Care | Payer: BC Managed Care – PPO | Attending: Obstetrics & Gynecology | Admitting: Obstetrics & Gynecology

## 2023-02-26 DIAGNOSIS — O26893 Other specified pregnancy related conditions, third trimester: Secondary | ICD-10-CM | POA: Diagnosis not present

## 2023-02-26 DIAGNOSIS — Z3689 Encounter for other specified antenatal screening: Secondary | ICD-10-CM

## 2023-02-26 DIAGNOSIS — Z3A39 39 weeks gestation of pregnancy: Secondary | ICD-10-CM | POA: Diagnosis not present

## 2023-02-26 DIAGNOSIS — O4693 Antepartum hemorrhage, unspecified, third trimester: Secondary | ICD-10-CM | POA: Diagnosis not present

## 2023-02-26 DIAGNOSIS — O09293 Supervision of pregnancy with other poor reproductive or obstetric history, third trimester: Secondary | ICD-10-CM | POA: Insufficient documentation

## 2023-02-26 DIAGNOSIS — O24113 Pre-existing diabetes mellitus, type 2, in pregnancy, third trimester: Secondary | ICD-10-CM | POA: Insufficient documentation

## 2023-02-26 DIAGNOSIS — O479 False labor, unspecified: Secondary | ICD-10-CM

## 2023-02-26 LAB — URINALYSIS, ROUTINE W REFLEX MICROSCOPIC
Bilirubin Urine: NEGATIVE
Glucose, UA: NEGATIVE mg/dL
Hgb urine dipstick: NEGATIVE
Ketones, ur: NEGATIVE mg/dL
Leukocytes,Ua: NEGATIVE
Nitrite: NEGATIVE
Protein, ur: NEGATIVE mg/dL
Specific Gravity, Urine: 1.008 (ref 1.005–1.030)
pH: 6 (ref 5.0–8.0)

## 2023-02-26 NOTE — MAU Provider Note (Signed)
Chief Complaint:  Abdominal Pain  HPI   Event Date/Time   First Provider Initiated Contact with Patient 02/26/23 1155     Donna Mcknight is a 27 y.o. G2P1001 at [redacted]w[redacted]d who presents to maternity admissions reporting sharp, lower abdominal pain. Had an episode of sharp lower abdominal pain that was constant with waves of intensity yesterday afternoon, but it faded by evening. Woke up this morning with the same constant, lower abdominal pain. Is feeling occasional tightness but not real contractions. Denies any dysuria, vaginal bleeding, discharge or LOF. Feeling regular fetal movement. No other physical complaints.    Pregnancy Course: Receives care at [facility], prenatal records reviewed. Had a successful ECV on 02/15/23.  Past Medical History:  Diagnosis Date   Anemia    as teenager   Anxiety    Breech presentation of fetus 02/15/2023   Depression    Gestational diabetes    first preg   History of postpartum hemorrhage, currently pregnant    Mental disorder    anxiety, depression   UTI (urinary tract infection)    Vaginal bleeding in pregnancy, third trimester 01/09/2023   OB History  Gravida Para Term Preterm AB Living  2 1 1  0 0 1  SAB IAB Ectopic Multiple Live Births  0 0 0 0 1    # Outcome Date GA Lbr Len/2nd Weight Sex Type Anes PTL Lv  2 Current           1 Term 04/18/21 [redacted]w[redacted]d 06:03 / 00:42 7 lb 10 oz (3.459 kg) F Vag-Spont Local N LIV     Birth Comments: none     Complications: Gestational diabetes   Past Surgical History:  Procedure Laterality Date   MOUTH SURGERY     Family History  Problem Relation Age of Onset   Cancer Maternal Grandmother    Diabetes Maternal Grandfather    Cirrhosis Father    Hypertension Mother    Fibroids Mother    Social History   Tobacco Use   Smoking status: Never   Smokeless tobacco: Never   Tobacco comments:    Quit 2014  Vaping Use   Vaping status: Former  Substance Use Topics   Alcohol use: Not Currently     Alcohol/week: 10.0 standard drinks of alcohol    Types: 10 Glasses of wine per week    Comment: almost everyday   Drug use: Not Currently    Comment: prior to preg   Allergies  Allergen Reactions   Nickel     Other reaction(s): edema   Other Hives and Swelling    Nickel     No medications prior to admission.   I have reviewed patient's Past Medical Hx, Surgical Hx, Family Hx, Social Hx, medications and allergies.   ROS  Pertinent items noted in HPI and remainder of comprehensive ROS otherwise negative.   PHYSICAL EXAM  Patient Vitals for the past 24 hrs:  BP Temp Temp src Pulse Resp SpO2 Height Weight  02/26/23 1300 116/72 -- -- 84 -- -- -- --  02/26/23 1056 -- -- -- -- -- -- 5' 5.5" (1.664 m) 203 lb 9.6 oz (92.4 kg)  02/26/23 1055 117/72 (!) 97.5 F (36.4 C) Oral 86 16 98 % -- --   Constitutional: Well-developed, well-nourished female in no acute distress.  Cardiovascular: normal rate & rhythm, warm and well-perfused Respiratory: normal effort, no problems with respiration noted GI: Abd soft, non-tender, non-distended MS: Extremities nontender, no edema, normal ROM Neurologic: Alert and oriented  x 4.  GU: no CVA tenderness Pelvic: NEFG, physiologic discharge  Dilation: 3 Effacement (%): 50 Cervical Position: Middle Station: -2 Presentation: Vertex Exam by:: Jessica Hahsem RN  Fetal Tracing: reactive Baseline: 130 Variability: moderate Accelerations: 15x15 Decelerations: none Toco: UI with occasional contractions, those are felt sometimes as sharp, sometimes as tightness only   Labs: Results for orders placed or performed during the hospital encounter of 02/26/23 (from the past 24 hour(s))  Urinalysis, Routine w reflex microscopic -Urine, Clean Catch     Status: None   Collection Time: 02/26/23  1:05 PM  Result Value Ref Range   Color, Urine YELLOW YELLOW   APPearance CLEAR CLEAR   Specific Gravity, Urine 1.008 1.005 - 1.030   pH 6.0 5.0 - 8.0   Glucose,  UA NEGATIVE NEGATIVE mg/dL   Hgb urine dipstick NEGATIVE NEGATIVE   Bilirubin Urine NEGATIVE NEGATIVE   Ketones, ur NEGATIVE NEGATIVE mg/dL   Protein, ur NEGATIVE NEGATIVE mg/dL   Nitrite NEGATIVE NEGATIVE   Leukocytes,Ua NEGATIVE NEGATIVE   Imaging:  [place] MFM OB LIMITED  Result Date: 02/26/2023 ----------------------------------------------------------------------  OBSTETRICS REPORT                       (Signed Final 02/26/2023 01:32 pm) ---------------------------------------------------------------------- Patient Info  ID #:       [id number]                          D.O.B.:  1995/06/02 (27 yrs)  Name:       Donna LINDSKOG-              Visit Date: 02/26/2023 12:30 pm              Mcknight ---------------------------------------------------------------------- Performed By  Attending:        Noralee Space MD        Referred By:       [facility] MAU/Triage  Performed By:     Isabel Caprice        [facility] ---------------------------------------------------------------------- Orders  #  Description                           Code        Ordered By  1  [place] MFM OB LIMITED                     40981.19    Edd Arbour ----------------------------------------------------------------------  #  Order #                     Accession #                Episode #  1  [id number]                   [id number]                 [id number] ---------------------------------------------------------------------- Indications  [redacted] weeks gestation of pregnancy  Z3A.39  Vaginal bleeding in pregnancy, third trimester  O46.93  Poor obstetric history: Previous gestational    O09.299  diabetes ---------------------------------------------------------------------- Fetal Evaluation  Num Of Fetuses:          1  Fetal Heart Rate(bpm):   150  Cardiac Activity:        Observed  Presentation:            Cephalic  Placenta:                 Posterior  Amniotic Fluid  AFI FV:      Within normal limits  AFI Sum(cm)     %Tile       Largest Pocket(cm)  15.8            65          5.6  RUQ(cm)       RLQ(cm)       LUQ(cm)        LLQ(cm)  5.6           1.7           4.7            3.8 ---------------------------------------------------------------------- OB History  Gravidity:    2         Term:   1        Prem:   0        SAB:   0  TOP:          0       Ectopic:  0        Living: 1 ---------------------------------------------------------------------- Gestational Age  LMP:           39w 3d        Date:  05/26/22                  EDD:   03/02/23  Best:          39w 3d     Det. By:  LMP  (05/26/22)          EDD:   03/02/23 ---------------------------------------------------------------------- Impression  Patient was evaluated for abdominal pain .  A limited ultrasound study was performed .Amniotic fluid is  normal and good fetal activity is seen .Cephalic presentation. ----------------------------------------------------------------------                  Noralee Space, MD Electronically Signed Final Report   02/26/2023 01:32 pm ----------------------------------------------------------------------    MDM & MAU COURSE  MDM: High  MAU Course: Orders Placed This Encounter  Procedures   [place] MFM OB LIMITED   Urinalysis, Routine w reflex microscopic -Urine, Clean Catch   Discharge patient   No orders of the defined types were placed in this encounter.  Pain down to a 3/10 prior to CNM assessment but sent to ultrasound to r/o abruption. Baby still cephalic, no bleeding noted and abdomen non-tender. UA normal. Advised it could be related to fetal positioning, baby is head down but not well engaged. Suggested a warm bath at home, ambulation and then rest. Stable for discharge home.   ASSESSMENT   1. NST (non-stress test) reactive   2. Uterine contractions   3. [redacted] weeks gestation of pregnancy    PLAN  Discharge home in stable condition with term  labor precautions.     Follow-up Information     [facility] Follow up.   Specialty: Obstetrics and Gynecology Why: as scheduled  for ongoing prenatal care Contact information: [address] [place] [address] [phone]                Allergies as of 02/26/2023       Reactions   Nickel    Other reaction(s): edema   Other Hives, Swelling   Nickel        Medication List     TAKE these medications    acetaminophen 325 MG tablet Commonly known as: TYLENOL Take 650 mg by mouth every 6 (six) hours as needed.   ferrous sulfate 325 (65 FE) MG tablet Take 1 tablet (325 mg total) by mouth every other day.   omeprazole 40 MG capsule Commonly known as: PRILOSEC Take 1 capsule (40 mg total) by mouth daily.   PRENATAL VITAMIN PO Take by mouth.       Edd Arbour, CNM, MSN, IBCLC Certified Nurse Midwife, [facility]

## 2023-02-26 NOTE — MAU Note (Signed)
.  Donna Mcknight is a 27 y.o. at [redacted]w[redacted]d here in MAU reporting: constant lower abd pain that started this morning.  Pt states pain is sharp and constant and doesn't feel like ctx. Denies LOF or vag bleeding.  Reports she feels fetal movement but states its less than normal.     Pain score: 4/10 Vitals:   02/26/23 1055  BP: 117/72  Pulse: 86  Resp: 16  Temp: (!) 97.5 F (36.4 C)  SpO2: 98%     FHT:140 Lab orders placed from triage:   ua

## 2023-02-27 ENCOUNTER — Ambulatory Visit (INDEPENDENT_AMBULATORY_CARE_PROVIDER_SITE_OTHER): Payer: BC Managed Care – PPO | Admitting: Women's Health

## 2023-02-27 ENCOUNTER — Encounter: Payer: Self-pay | Admitting: Women's Health

## 2023-02-27 VITALS — BP 116/76 | HR 109

## 2023-02-27 VITALS — BP 128/77 | HR 97 | Wt 202.6 lb

## 2023-02-27 DIAGNOSIS — Z3483 Encounter for supervision of other normal pregnancy, third trimester: Secondary | ICD-10-CM

## 2023-02-27 DIAGNOSIS — O479 False labor, unspecified: Secondary | ICD-10-CM

## 2023-02-27 DIAGNOSIS — Z3A39 39 weeks gestation of pregnancy: Secondary | ICD-10-CM

## 2023-02-27 LAB — POCT URINALYSIS DIPSTICK OB
Blood, UA: NEGATIVE
Glucose, UA: NEGATIVE
Ketones, UA: NEGATIVE
Leukocytes, UA: NEGATIVE
Nitrite, UA: NEGATIVE
POC,PROTEIN,UA: NEGATIVE

## 2023-02-27 NOTE — Progress Notes (Signed)
LOW-RISK PREGNANCY VISIT Patient name: Donna Mcknight MRN [id number]  Date of birth: [birth year] Chief Complaint:   No chief complaint on file.  History of Present Illness:   Donna Mcknight is a 27 y.o. G37P1001 female at [redacted]w[redacted]d with an Estimated Date of Delivery: 03/02/23 being seen today for ongoing management of a low-risk pregnancy.   Today she reports  went to MAU yesterday for sharp abd pain, normal exam, normal u/s. No vb, good FM. Had contractions Sat, some since MAU visit . Contractions: Irritability. Vag. Bleeding: None.  Movement: Present. denies leaking of fluid.     08/25/2022    9:46 AM 06/30/2022    8:46 AM 08/23/2021    8:38 AM 06/21/2021    4:28 PM 01/08/2021    9:07 AM  Depression screen PHQ 2/9  Decreased Interest 0 1 0 1 0  Down, Depressed, Hopeless 0 1 0 1 0  PHQ - 2 Score 0 2 0 2 0  Altered sleeping 3 0 0 1 0  Tired, decreased energy 1 3 1 3 2   Change in appetite 3 2 0 0 1  Feeling bad or failure about yourself  0 1 0 1 0  Trouble concentrating 0 1 0 1 0  Moving slowly or fidgety/restless 0 0 0 0 0  Suicidal thoughts 0 1 0 0 0  PHQ-9 Score 7 10 1 8 3         08/25/2022    9:46 AM 06/30/2022    8:46 AM 08/23/2021    8:54 AM 06/21/2021    4:30 PM  GAD 7 : Generalized Anxiety Score  Nervous, Anxious, on Edge 2 1 1 1   Control/stop worrying 1 2 1 1   Worry too much - different things 1 1 0 1  Trouble relaxing 1 1 1  0  Restless 0 0 0 0  Easily annoyed or irritable 2 1 1 1   Afraid - awful might happen 2 0 1 0  Total GAD 7 Score 9 6 5 4       Review of Systems:   Pertinent items are noted in HPI Denies abnormal vaginal discharge w/ itching/odor/irritation, headaches, visual changes, shortness of breath, chest pain, abdominal pain, severe nausea/vomiting, or problems with urination or bowel movements unless otherwise stated above. Pertinent History Reviewed:  Reviewed past medical,surgical, social, obstetrical and family history.  Reviewed  problem list, medications and allergies. Physical Assessment:   Vitals:   02/27/23 0856  BP: 128/77  Pulse: 97  Weight: 202 lb 9.6 oz (91.9 kg)  Body mass index is 33.2 kg/m.        Physical Examination:   General appearance: Well appearing, and in no distress  Mental status: Alert, oriented to person, place, and time  Skin: Warm & dry  Cardiovascular: Normal heart rate noted  Respiratory: Normal respiratory effort, no distress  Abdomen: Soft, gravid, nontender  Pelvic: Cervical exam performed  Dilation: 4 Effacement (%): 70 Station: -2, Offered membrane sweeping, discussed r/b- pt decided to proceed, so membranes swept.   Extremities: Edema: None  Fetal Status: Fetal Heart Rate (bpm): 146 Fundal Height: 39 cm Movement: Present Presentation: Vertex  Chaperone: Malachy Mood   Results for orders placed or performed in visit on 02/27/23 (from the past 24 hour(s))  POC Urinalysis Dipstick OB   Collection Time: 02/27/23  9:00 AM  Result Value Ref Range   Color, UA     Clarity, UA     Glucose, UA Negative Negative  Bilirubin, UA     Ketones, UA neg    Spec Grav, UA     Blood, UA neg    pH, UA     POC,PROTEIN,UA Negative Negative, Trace, Small (1+), Moderate (2+), Large (3+), 4+   Urobilinogen, UA     Nitrite, UA neg    Leukocytes, UA Negative Negative   Appearance     Odor    Results for orders placed or performed during the hospital encounter of 02/26/23 (from the past 24 hour(s))  Urinalysis, Routine w reflex microscopic -Urine, Clean Catch   Collection Time: 02/26/23  1:05 PM  Result Value Ref Range   Color, Urine YELLOW YELLOW   APPearance CLEAR CLEAR   Specific Gravity, Urine 1.008 1.005 - 1.030   pH 6.0 5.0 - 8.0   Glucose, UA NEGATIVE NEGATIVE mg/dL   Hgb urine dipstick NEGATIVE NEGATIVE   Bilirubin Urine NEGATIVE NEGATIVE   Ketones, ur NEGATIVE NEGATIVE mg/dL   Protein, ur NEGATIVE NEGATIVE mg/dL   Nitrite NEGATIVE NEGATIVE   Leukocytes,Ua NEGATIVE NEGATIVE     Assessment & Plan:  1) Low-risk pregnancy G2P1001 at [redacted]w[redacted]d with an Estimated Date of Delivery: 03/02/23   2) S/P successful ECV on 11/6, baby still vtx   3) Admit @ 32wks for vb, s/p BMZ, none since      Meds: No orders of the defined types were placed in this encounter.  Labs/procedures today: SVE and membrane sweep  Plan:  Continue routine obstetrical care, postdates IOL scheduled for 11/29 [place] (none on 11/28 d/t holiday),  IOL form faxed and orders placed   Next visit: prefers will be in person for postdates NST     Reviewed: Term labor symptoms and general obstetric precautions including but not limited to vaginal bleeding, contractions, leaking of fluid and fetal movement were reviewed in detail with the patient.  All questions were answered. Does have home bp cuff. Office bp cuff given: not applicable. Check bp weekly, let us know if consistently >140 and/or >90.  Follow-up: Return for As scheduled.  Future Appointments  Date Time Provider Department Center  03/06/2023  8:50 AM [facility] NURSE [facility]  03/06/2023  9:10 AM Myna Hidalgo, DO [facility]    Orders Placed This Encounter  Procedures   POC Urinalysis Dipstick OB   Cheral Marker CNM, [facility] 02/27/2023 9:19 AM

## 2023-02-27 NOTE — Patient Instructions (Signed)
Heath, thank you for choosing our office today! We appreciate the opportunity to meet your healthcare needs. You may receive a short survey by mail, e-mail, or through [employer]. If you are happy with your care we would appreciate if you could take just a few minutes to complete the survey questions. We read all of your comments and take your feedback very seriously. Thank you again for choosing our office.  Center for [employer] Team at [facility]  [facility] at [facility] ([address] [place], [place] [address]) Entrance C, located off of [address] Free 24/7 valet parking   CLASSES: Go to [email or web address] to register for classes (childbirth, breastfeeding, waterbirth, infant CPR, daddy bootcamp, etc.)  Call the office [phone]) or go to [facility] if: You begin to have strong, frequent contractions Your water breaks.  Sometimes it is a big gush of fluid, sometimes it is just a trickle that keeps getting your panties wet or running down your legs You have vaginal bleeding.  It is normal to have a small amount of spotting if your cervix was checked.  You don't feel your baby moving like normal.  If you don't, get you something to eat and drink and lay down and focus on feeling your baby move.   If your baby is still not moving like normal, you should call the office or go to [facility].  Call the office [phone]) or go to [facility] hospital for these signs of pre-eclampsia: Severe headache that does not go away with Tylenol Visual changes- seeing spots, double, blurred vision Pain under your right breast or upper abdomen that does not go away with Tums or heartburn medicine Nausea and/or vomiting Severe swelling in your hands, feet, and face   [facility] Pediatricians/Family Doctors [facility]): [address] Colette Ribas, [phone]           [facility]: [address], [phone]                 [facility]): [address], [phone] (call to ask if accepting patients) [facility] Department: [address] [place], [phone]    [facility] Pediatricians/Family Doctors [facility]): [address], [phone] [facility]: [address] [place], [phone] [facility]: [address], [phone]  [facility] Doctors  [facility]): [phone] [facility]: [address] [phone]   [facility] Doctors [facility]: [address] [phone]  [facility] Doctors  [facility]: [phone], [phone]  Home Blood Pressure Monitoring for Patients   Your provider has recommended that you check your blood pressure (BP) at least once a week at home. If you do not have a blood pressure cuff at home, one will be provided for you. Contact your provider if you have not received your monitor within 1 week.   Helpful Tips for Accurate Home Blood Pressure Checks  Don't smoke, exercise, or drink caffeine 30 minutes before checking your BP Use the restroom before checking your BP (a full bladder can raise your pressure) Relax in a comfortable upright chair Feet on the ground Left arm resting comfortably on a flat surface at the level of your heart Legs uncrossed Back supported Sit quietly and don't talk Place the cuff on your bare arm Adjust snuggly, so that only two fingertips  can fit between your skin and the top of the cuff Check 2 readings separated by at least one minute Keep a log of your BP readings For a visual, please reference this diagram: http://ccnc.care/bpdiagram  Provider Name: [facility]     Phone: [phone]  Zone 1: ALL CLEAR  Continue to monitor your symptoms:  BP reading is less than 140 (top number) or less than 90 (bottom number)  No right  upper stomach pain No headaches or seeing spots No feeling nauseated or throwing up No swelling in face and hands  Zone 2: CAUTION Call your doctor's office for any of the following:  BP reading is greater than 140 (top number) or greater than 90 (bottom number)  Stomach pain under your ribs in the middle or right side Headaches or seeing spots Feeling nauseated or throwing up Swelling in face and hands  Zone 3: EMERGENCY  Seek immediate medical care if you have any of the following:  BP reading is greater than160 (top number) or greater than 110 (bottom number) Severe headaches not improving with Tylenol Serious difficulty catching your breath Any worsening symptoms from Zone 2   Braxton Hicks Contractions Contractions of the uterus can occur throughout pregnancy, but they are not always a sign that you are in labor. You may have practice contractions called Braxton Hicks contractions. These false labor contractions are sometimes confused with true labor. What are [place] contractions? Braxton Hicks contractions are tightening movements that occur in the muscles of the uterus before labor. Unlike true labor contractions, these contractions do not result in opening (dilation) and thinning of the cervix. Toward the end of pregnancy (32-34 weeks), Braxton Hicks contractions can happen more often and may become stronger. These contractions are sometimes difficult to tell apart from true labor because they can be very uncomfortable. You should not feel embarrassed if you go to the hospital with false labor. Sometimes, the only way to tell if you are in true labor is for your health care provider to look for changes in the cervix. The health care provider will do a physical exam and may monitor your contractions. If you are not in true labor, the exam should show that your cervix is not dilating and your water has not broken. If there are no other health problems associated with your  pregnancy, it is completely safe for you to be sent home with false labor. You may continue to have Braxton Hicks contractions until you go into true labor. How to tell the difference between true labor and false labor True labor Contractions last 30-70 seconds. Contractions become very regular. Discomfort is usually felt in the top of the uterus, and it spreads to the lower abdomen and low back. Contractions do not go away with walking. Contractions usually become more intense and increase in frequency. The cervix dilates and gets thinner. False labor Contractions are usually shorter and not as strong as true labor contractions. Contractions are usually irregular. Contractions are often felt in the front of the lower abdomen and in the groin. Contractions may go away when you walk around or change positions while lying down. Contractions get weaker and are shorter-lasting as time goes on. The cervix usually does not dilate or become thin. Follow these instructions at home:  Take over-the-counter and prescription medicines only as told by your health care provider. Keep up with your usual exercises and follow other instructions from your health care provider. Eat and drink lightly if you think  you are going into labor. If Braxton Hicks contractions are making you uncomfortable: Change your position from lying down or resting to walking, or change from walking to resting. Sit and rest in a tub of warm water. Drink enough fluid to keep your urine pale yellow. Dehydration may cause these contractions. Do slow and deep breathing several times an hour. Keep all follow-up prenatal visits as told by your health care provider. This is important. Contact a health care provider if: You have a fever. You have continuous pain in your abdomen. Get help right away if: Your contractions become stronger, more regular, and closer together. You have fluid leaking or gushing from your vagina. You pass  blood-tinged mucus (bloody show). You have bleeding from your vagina. You have low back pain that you never had before. You feel your babys head pushing down and causing pelvic pressure. Your baby is not moving inside you as much as it used to. Summary Contractions that occur before labor are called Braxton Hicks contractions, false labor, or practice contractions. Braxton Hicks contractions are usually shorter, weaker, farther apart, and less regular than true labor contractions. True labor contractions usually become progressively stronger and regular, and they become more frequent. Manage discomfort from [facility] contractions by changing position, resting in a warm bath, drinking plenty of water, or practicing deep breathing. This information is not intended to replace advice given to you by your health care provider. Make sure you discuss any questions you have with your health care provider. Document Revised: 03/10/2017 Document Reviewed: 08/11/2016 Elsevier Patient Education  [place].

## 2023-02-27 NOTE — Progress Notes (Signed)
Work-in LOW-RISK PREGNANCY VISIT Patient name: Donna Mcknight MRN [id number]  Date of birth: [birth year] Chief Complaint:   labor check  History of Present Illness:   Donna Mcknight is a 27 y.o. G72P1001 female at [redacted]w[redacted]d with an Estimated Date of Delivery: 03/02/23 being seen today for ongoing management of a low-risk pregnancy.   Today she is being seen as a work-in to rule out labor. Membrane sweep at appt earlier today, cx was 4/70/-2 then, contractions q apart or closer for a good 2hrs, do not feel like they're getting more intense. Wanted to come here before going to hospital.  Contractions: Regular. Vag. Bleeding: None.  Movement: Present. denies leaking of fluid.     08/25/2022    9:46 AM 06/30/2022    8:46 AM 08/23/2021    8:38 AM 06/21/2021    4:28 PM 01/08/2021    9:07 AM  Depression screen PHQ 2/9  Decreased Interest 0 1 0 1 0  Down, Depressed, Hopeless 0 1 0 1 0  PHQ - 2 Score 0 2 0 2 0  Altered sleeping 3 0 0 1 0  Tired, decreased energy 1 3 1 3 2   Change in appetite 3 2 0 0 1  Feeling bad or failure about yourself  0 1 0 1 0  Trouble concentrating 0 1 0 1 0  Moving slowly or fidgety/restless 0 0 0 0 0  Suicidal thoughts 0 1 0 0 0  PHQ-9 Score 7 10 1 8 3         08/25/2022    9:46 AM 06/30/2022    8:46 AM 08/23/2021    8:54 AM 06/21/2021    4:30 PM  GAD 7 : Generalized Anxiety Score  Nervous, Anxious, on Edge 2 1 1 1   Control/stop worrying 1 2 1 1   Worry too much - different things 1 1 0 1  Trouble relaxing 1 1 1  0  Restless 0 0 0 0  Easily annoyed or irritable 2 1 1 1   Afraid - awful might happen 2 0 1 0  Total GAD 7 Score 9 6 5 4       Review of Systems:   Pertinent items are noted in HPI Denies abnormal vaginal discharge w/ itching/odor/irritation, headaches, visual changes, shortness of breath, chest pain, abdominal pain, severe nausea/vomiting, or problems with urination or bowel movements unless otherwise stated above. Pertinent  History Reviewed:  Reviewed past medical,surgical, social, obstetrical and family history.  Reviewed problem list, medications and allergies. Physical Assessment:   Vitals:   02/27/23 1623  BP: 116/76  Pulse: (!) 109  There is no height or weight on file to calculate BMI.        Physical Examination:   General appearance: Well appearing, and in no distress  Mental status: Alert, oriented to person, place, and time  Skin: Warm & dry  Cardiovascular: Normal heart rate noted  Respiratory: Normal respiratory effort, no distress  Abdomen: Soft, gravid, nontender  Pelvic: Cervical exam performed  Dilation: 4 Effacement (%): 70 Station: -2  Extremities: Edema: None  Fetal Status: Fetal Heart Rate (bpm): 130   Movement: Present Presentation: Vertex NST: FHR baseline 130 bpm, Variability: moderate, Accelerations:present, Decelerations:  Absent= Cat 1/reactive Toco: irregular   Chaperone: Malachy Mood   Results for orders placed or performed in visit on 02/27/23 (from the past 24 hour(s))  POC Urinalysis Dipstick OB   Collection Time: 02/27/23  9:00 AM  Result Value Ref Range  Color, UA     Clarity, UA     Glucose, UA Negative Negative   Bilirubin, UA     Ketones, UA neg    Spec Grav, UA     Blood, UA neg    pH, UA     POC,PROTEIN,UA Negative Negative, Trace, Small (1+), Moderate (2+), Large (3+), 4+   Urobilinogen, UA     Nitrite, UA neg    Leukocytes, UA Negative Negative   Appearance     Odor      Assessment & Plan:  1) Low-risk pregnancy G2P1001 at [redacted]w[redacted]d with an Estimated Date of Delivery: 03/02/23   2) Contractions w/o cervical change, membrane sweep this am, recommended sex this evening, reviewed labor s/s, reasons to seek care   Meds: No orders of the defined types were placed in this encounter.  Labs/procedures today: SVE and NST  Plan:  Continue routine obstetrical care  Next visit: prefers in person    Reviewed: Term labor symptoms and general obstetric  precautions including but not limited to vaginal bleeding, contractions, leaking of fluid and fetal movement were reviewed in detail with the patient.  All questions were answered. Does have home bp cuff. Office bp cuff given: not applicable. Check bp weekly, let us know if consistently >140 and/or >90.  Follow-up: Return for As scheduled.  Future Appointments  Date Time Provider Department Center  03/06/2023  8:50 AM [facility] NURSE [facility]  03/06/2023  9:10 AM Myna Hidalgo, DO [facility]  03/10/2023 12:00 AM [facility] SCHED ROOM [facility] None    No orders of the defined types were placed in this encounter.  Cheral Marker CNM, [facility] 02/27/2023 4:36 PM

## 2023-03-02 ENCOUNTER — Ambulatory Visit: Payer: BC Managed Care – PPO | Admitting: Women's Health

## 2023-03-02 ENCOUNTER — Telehealth (HOSPITAL_COMMUNITY): Payer: Self-pay | Admitting: *Deleted

## 2023-03-02 ENCOUNTER — Encounter (HOSPITAL_COMMUNITY): Payer: Self-pay | Admitting: *Deleted

## 2023-03-02 VITALS — BP 116/76 | HR 90 | Wt 201.6 lb

## 2023-03-02 DIAGNOSIS — Z331 Pregnant state, incidental: Secondary | ICD-10-CM

## 2023-03-02 DIAGNOSIS — Z3A40 40 weeks gestation of pregnancy: Secondary | ICD-10-CM | POA: Diagnosis not present

## 2023-03-02 DIAGNOSIS — Z3483 Encounter for supervision of other normal pregnancy, third trimester: Secondary | ICD-10-CM

## 2023-03-02 DIAGNOSIS — O480 Post-term pregnancy: Secondary | ICD-10-CM | POA: Diagnosis not present

## 2023-03-02 DIAGNOSIS — Z1389 Encounter for screening for other disorder: Secondary | ICD-10-CM

## 2023-03-02 LAB — POCT URINALYSIS DIPSTICK OB
Blood, UA: NEGATIVE
Glucose, UA: NEGATIVE
Ketones, UA: NEGATIVE
Leukocytes, UA: NEGATIVE
Nitrite, UA: NEGATIVE
POC,PROTEIN,UA: NEGATIVE

## 2023-03-02 NOTE — Progress Notes (Signed)
Work-in LOW-RISK PREGNANCY VISIT Patient name: Donna Mcknight MRN [id number]  Date of birth: [birth year] Chief Complaint:   Routine Prenatal Visit (Cervical check)  History of Present Illness:   Donna Mcknight is a 27 y.o. G42P1001 female at [redacted]w[redacted]d with an Estimated Date of Delivery: 03/02/23 being seen today for ongoing management of a low-risk pregnancy.   Today she is being seen today as a work-in for cervical check. Had membrane sweep Monday, was 4/70/-2. Sent MyChart message today she had some pinkish brown mucous yesterday and some this am, none since. Did acupuncture yesterday to see if would put her into labor, was told if it works usually does w/in 24-48hrs.  Contractions: Irregular.  .  Movement: Present. denies leaking of fluid.     08/25/2022    9:46 AM 06/30/2022    8:46 AM 08/23/2021    8:38 AM 06/21/2021    4:28 PM 01/08/2021    9:07 AM  Depression screen PHQ 2/9  Decreased Interest 0 1 0 1 0  Down, Depressed, Hopeless 0 1 0 1 0  PHQ - 2 Score 0 2 0 2 0  Altered sleeping 3 0 0 1 0  Tired, decreased energy 1 3 1 3 2   Change in appetite 3 2 0 0 1  Feeling bad or failure about yourself  0 1 0 1 0  Trouble concentrating 0 1 0 1 0  Moving slowly or fidgety/restless 0 0 0 0 0  Suicidal thoughts 0 1 0 0 0  PHQ-9 Score 7 10 1 8 3         08/25/2022    9:46 AM 06/30/2022    8:46 AM 08/23/2021    8:54 AM 06/21/2021    4:30 PM  GAD 7 : Generalized Anxiety Score  Nervous, Anxious, on Edge 2 1 1 1   Control/stop worrying 1 2 1 1   Worry too much - different things 1 1 0 1  Trouble relaxing 1 1 1  0  Restless 0 0 0 0  Easily annoyed or irritable 2 1 1 1   Afraid - awful might happen 2 0 1 0  Total GAD 7 Score 9 6 5 4       Review of Systems:   Pertinent items are noted in HPI Denies abnormal vaginal discharge w/ itching/odor/irritation, headaches, visual changes, shortness of breath, chest pain, abdominal pain, severe nausea/vomiting, or problems with  urination or bowel movements unless otherwise stated above. Pertinent History Reviewed:  Reviewed past medical,surgical, social, obstetrical and family history.  Reviewed problem list, medications and allergies. Physical Assessment:   Vitals:   03/02/23 1551  BP: 116/76  Pulse: 90  Weight: 201 lb 9.6 oz (91.4 kg)  Body mass index is 33.04 kg/m.        Physical Examination:   General appearance: Well appearing, and in no distress  Mental status: Alert, oriented to person, place, and time  Skin: Warm & dry  Cardiovascular: Normal heart rate noted  Respiratory: Normal respiratory effort, no distress  Abdomen: Soft, gravid, nontender  Pelvic:  spec exam: mucousy d/c, no blood, SVE: 4/70/-2, vtx   Dilation: 4 Effacement (%): 70 Station: -2  Extremities: Edema: None  Fetal Status: Fetal Heart Rate (bpm): 125   Movement: Present Presentation: Vertex NST: FHR baseline 125 bpm, Variability: moderate, Accelerations:present, Decelerations:  Absent= Cat 1/reactive Toco: none   Chaperone: Peggy Dones   Results for orders placed or performed in visit on 03/02/23 (from the past 24 hour(s))  POC Urinalysis Dipstick OB   Collection Time: 03/02/23  4:04 PM  Result Value Ref Range   Color, UA     Clarity, UA     Glucose, UA Negative Negative   Bilirubin, UA     Ketones, UA negative    Spec Grav, UA     Blood, UA negative    pH, UA     POC,PROTEIN,UA Negative Negative, Trace, Small (1+), Moderate (2+), Large (3+), 4+   Urobilinogen, UA     Nitrite, UA negative    Leukocytes, UA Negative Negative   Appearance     Odor      Assessment & Plan:  1) Low-risk pregnancy G2P1001 at [redacted]w[redacted]d with an Estimated Date of Delivery: 03/02/23   2) Postdates, reactive NST, no cervical change since last exam, has IOL scheduled for 11/29 [place]   Meds: No orders of the defined types were placed in this encounter.  Labs/procedures today: spec exam, SVE, and NST  Plan:  Continue routine obstetrical care   Next visit: prefers in person    Reviewed: Term labor symptoms and general obstetric precautions including but not limited to vaginal bleeding, contractions, leaking of fluid and fetal movement were reviewed in detail with the patient.  All questions were answered. Does have home bp cuff. Office bp cuff given: not applicable. Check bp weekly, let us know if consistently >140 and/or >90.  Follow-up: Return for As scheduled.  Future Appointments  Date Time Provider Department Center  03/06/2023  8:50 AM [facility] NURSE [facility]  03/06/2023  9:10 AM Myna Hidalgo, DO [facility]  03/10/2023 12:00 AM [facility] SCHED ROOM [facility] None    Orders Placed This Encounter  Procedures   POC Urinalysis Dipstick OB   Cheral Marker CNM, [facility] 03/02/2023 4:53 PM

## 2023-03-02 NOTE — Telephone Encounter (Signed)
Preadmission screen  

## 2023-03-02 NOTE — Patient Instructions (Signed)
Donna Mcknight, thank you for choosing our office today! We appreciate the opportunity to meet your healthcare needs. You may receive a short survey by mail, e-mail, or through [employer]. If you are happy with your care we would appreciate if you could take just a few minutes to complete the survey questions. We read all of your comments and take your feedback very seriously. Thank you again for choosing our office.  Center for [employer] Team at [facility]  [facility] at [facility] ([address] [place], [place] [address]) Entrance C, located off of [address] Free 24/7 valet parking   CLASSES: Go to [email or web address] to register for classes (childbirth, breastfeeding, waterbirth, infant CPR, daddy bootcamp, etc.)  Call the office [phone]) or go to [facility] if: You begin to have strong, frequent contractions Your water breaks.  Sometimes it is a big gush of fluid, sometimes it is just a trickle that keeps getting your panties wet or running down your legs You have vaginal bleeding.  It is normal to have a small amount of spotting if your cervix was checked.  You don't feel your baby moving like normal.  If you don't, get you something to eat and drink and lay down and focus on feeling your baby move.   If your baby is still not moving like normal, you should call the office or go to [facility].  Call the office [phone]) or go to [facility] hospital for these signs of pre-eclampsia: Severe headache that does not go away with Tylenol Visual changes- seeing spots, double, blurred vision Pain under your right breast or upper abdomen that does not go away with Tums or heartburn medicine Nausea and/or vomiting Severe swelling in your hands, feet, and face   [facility] Pediatricians/Family Doctors [facility]): [address] Colette Ribas, [phone]           [facility]: [address], [phone]                 [facility]): [address], [phone] (call to ask if accepting patients) [facility] Department: [address] [place], [phone]    [facility] Pediatricians/Family Doctors [facility]): [address], [phone] [facility]: [address] [place], [phone] [facility]: [address], [phone]  [facility] Doctors  [facility]): [phone] [facility]: [address] [phone]   [facility] Doctors [facility]: [address] [phone]  [facility] Doctors  [facility]: [phone], [phone]  Home Blood Pressure Monitoring for Patients   Your provider has recommended that you check your blood pressure (BP) at least once a week at home. If you do not have a blood pressure cuff at home, one will be provided for you. Contact your provider if you have not received your monitor within 1 week.   Helpful Tips for Accurate Home Blood Pressure Checks  Don't smoke, exercise, or drink caffeine 30 minutes before checking your BP Use the restroom before checking your BP (a full bladder can raise your pressure) Relax in a comfortable upright chair Feet on the ground Left arm resting comfortably on a flat surface at the level of your heart Legs uncrossed Back supported Sit quietly and don't talk Place the cuff on your bare arm Adjust snuggly, so that only two fingertips  can fit between your skin and the top of the cuff Check 2 readings separated by at least one minute Keep a log of your BP readings For a visual, please reference this diagram: http://ccnc.care/bpdiagram  Provider Name: [facility]     Phone: [phone]  Zone 1: ALL CLEAR  Continue to monitor your symptoms:  BP reading is less than 140 (top number) or less than 90 (bottom number)  No right  upper stomach pain No headaches or seeing spots No feeling nauseated or throwing up No swelling in face and hands  Zone 2: CAUTION Call your doctor's office for any of the following:  BP reading is greater than 140 (top number) or greater than 90 (bottom number)  Stomach pain under your ribs in the middle or right side Headaches or seeing spots Feeling nauseated or throwing up Swelling in face and hands  Zone 3: EMERGENCY  Seek immediate medical care if you have any of the following:  BP reading is greater than160 (top number) or greater than 110 (bottom number) Severe headaches not improving with Tylenol Serious difficulty catching your breath Any worsening symptoms from Zone 2   Braxton Hicks Contractions Contractions of the uterus can occur throughout pregnancy, but they are not always a sign that you are in labor. You may have practice contractions called Braxton Hicks contractions. These false labor contractions are sometimes confused with true labor. What are [place] contractions? Braxton Hicks contractions are tightening movements that occur in the muscles of the uterus before labor. Unlike true labor contractions, these contractions do not result in opening (dilation) and thinning of the cervix. Toward the end of pregnancy (32-34 weeks), Braxton Hicks contractions can happen more often and may become stronger. These contractions are sometimes difficult to tell apart from true labor because they can be very uncomfortable. You should not feel embarrassed if you go to the hospital with false labor. Sometimes, the only way to tell if you are in true labor is for your health care provider to look for changes in the cervix. The health care provider will do a physical exam and may monitor your contractions. If you are not in true labor, the exam should show that your cervix is not dilating and your water has not broken. If there are no other health problems associated with your  pregnancy, it is completely safe for you to be sent home with false labor. You may continue to have Braxton Hicks contractions until you go into true labor. How to tell the difference between true labor and false labor True labor Contractions last 30-70 seconds. Contractions become very regular. Discomfort is usually felt in the top of the uterus, and it spreads to the lower abdomen and low back. Contractions do not go away with walking. Contractions usually become more intense and increase in frequency. The cervix dilates and gets thinner. False labor Contractions are usually shorter and not as strong as true labor contractions. Contractions are usually irregular. Contractions are often felt in the front of the lower abdomen and in the groin. Contractions may go away when you walk around or change positions while lying down. Contractions get weaker and are shorter-lasting as time goes on. The cervix usually does not dilate or become thin. Follow these instructions at home:  Take over-the-counter and prescription medicines only as told by your health care provider. Keep up with your usual exercises and follow other instructions from your health care provider. Eat and drink lightly if you think  you are going into labor. If Braxton Hicks contractions are making you uncomfortable: Change your position from lying down or resting to walking, or change from walking to resting. Sit and rest in a tub of warm water. Drink enough fluid to keep your urine pale yellow. Dehydration may cause these contractions. Do slow and deep breathing several times an hour. Keep all follow-up prenatal visits as told by your health care provider. This is important. Contact a health care provider if: You have a fever. You have continuous pain in your abdomen. Get help right away if: Your contractions become stronger, more regular, and closer together. You have fluid leaking or gushing from your vagina. You pass  blood-tinged mucus (bloody show). You have bleeding from your vagina. You have low back pain that you never had before. You feel your babys head pushing down and causing pelvic pressure. Your baby is not moving inside you as much as it used to. Summary Contractions that occur before labor are called Braxton Hicks contractions, false labor, or practice contractions. Braxton Hicks contractions are usually shorter, weaker, farther apart, and less regular than true labor contractions. True labor contractions usually become progressively stronger and regular, and they become more frequent. Manage discomfort from [facility] contractions by changing position, resting in a warm bath, drinking plenty of water, or practicing deep breathing. This information is not intended to replace advice given to you by your health care provider. Make sure you discuss any questions you have with your health care provider. Document Revised: 03/10/2017 Document Reviewed: 08/11/2016 Elsevier Patient Education  [place].

## 2023-03-06 ENCOUNTER — Ambulatory Visit (INDEPENDENT_AMBULATORY_CARE_PROVIDER_SITE_OTHER): Payer: BC Managed Care – PPO | Admitting: Women's Health

## 2023-03-06 ENCOUNTER — Encounter: Payer: Self-pay | Admitting: Women's Health

## 2023-03-06 ENCOUNTER — Other Ambulatory Visit: Payer: BC Managed Care – PPO

## 2023-03-06 VITALS — BP 119/78 | HR 80 | Wt 204.0 lb

## 2023-03-06 DIAGNOSIS — Z3483 Encounter for supervision of other normal pregnancy, third trimester: Secondary | ICD-10-CM

## 2023-03-06 DIAGNOSIS — Z3A40 40 weeks gestation of pregnancy: Secondary | ICD-10-CM

## 2023-03-06 DIAGNOSIS — O480 Post-term pregnancy: Secondary | ICD-10-CM | POA: Diagnosis not present

## 2023-03-06 NOTE — Progress Notes (Signed)
LOW-RISK PREGNANCY VISIT Patient name: Donna Mcknight MRN [id number]  Date of birth: [birth year] Chief Complaint:   Routine Prenatal Visit (NST post dates)  History of Present Illness:   Donna Mcknight is a 27 y.o. G54P1001 female at [redacted]w[redacted]d with an Estimated Date of Delivery: 03/02/23 being seen today for ongoing management of a low-risk pregnancy.   Today she reports  contractions yesterday, almost went to hospital but they stopped . Mom has to leave Sat, won't have any childcare after that. Contractions: Irregular. Vag. Bleeding: None.  Movement: Present. denies leaking of fluid.     08/25/2022    9:46 AM 06/30/2022    8:46 AM 08/23/2021    8:38 AM 06/21/2021    4:28 PM 01/08/2021    9:07 AM  Depression screen PHQ 2/9  Decreased Interest 0 1 0 1 0  Down, Depressed, Hopeless 0 1 0 1 0  PHQ - 2 Score 0 2 0 2 0  Altered sleeping 3 0 0 1 0  Tired, decreased energy 1 3 1 3 2   Change in appetite 3 2 0 0 1  Feeling bad or failure about yourself  0 1 0 1 0  Trouble concentrating 0 1 0 1 0  Moving slowly or fidgety/restless 0 0 0 0 0  Suicidal thoughts 0 1 0 0 0  PHQ-9 Score 7 10 1 8 3         08/25/2022    9:46 AM 06/30/2022    8:46 AM 08/23/2021    8:54 AM 06/21/2021    4:30 PM  GAD 7 : Generalized Anxiety Score  Nervous, Anxious, on Edge 2 1 1 1   Control/stop worrying 1 2 1 1   Worry too much - different things 1 1 0 1  Trouble relaxing 1 1 1  0  Restless 0 0 0 0  Easily annoyed or irritable 2 1 1 1   Afraid - awful might happen 2 0 1 0  Total GAD 7 Score 9 6 5 4       Review of Systems:   Pertinent items are noted in HPI Denies abnormal vaginal discharge w/ itching/odor/irritation, headaches, visual changes, shortness of breath, chest pain, abdominal pain, severe nausea/vomiting, or problems with urination or bowel movements unless otherwise stated above. Pertinent History Reviewed:  Reviewed past medical,surgical, social, obstetrical and family history.   Reviewed problem list, medications and allergies. Physical Assessment:   Vitals:   03/06/23 0859  BP: 119/78  Pulse: 80  Weight: 204 lb (92.5 kg)  Body mass index is 33.43 kg/m.        Physical Examination:   General appearance: Well appearing, and in no distress  Mental status: Alert, oriented to person, place, and time  Skin: Warm & dry  Cardiovascular: Normal heart rate noted  Respiratory: Normal respiratory effort, no distress  Abdomen: Soft, gravid, nontender  Pelvic: Cervical exam performed  Dilation: 4 Effacement (%): 70 Station: -2  Extremities: Edema: None  Fetal Status:     Movement: Present Presentation: Vertex NST: FHR baseline 125 bpm, Variability: moderate, Accelerations:present, Decelerations:  Absent= Cat 1/reactive Toco: q1mins   Chaperone: Sherri Kaywood   No results found for this or any previous visit (from the past 24 hour(s)).  Assessment & Plan:  1) Low-risk pregnancy G2P1001 at [redacted]w[redacted]d with an Estimated Date of Delivery: 03/02/23   2) Postdates, reactive NST, on eIOL tomorrow (childcare issues), postdates IOL 11/29 [place]  3) H/O PPH> plan TXA   Meds: No orders of  the defined types were placed in this encounter.  Labs/procedures today: SVE and NST  Reviewed: Term labor symptoms and general obstetric precautions including but not limited to vaginal bleeding, contractions, leaking of fluid and fetal movement were reviewed in detail with the patient.  All questions were answered. Does have home bp cuff. Office bp cuff given: not applicable. Check bp weekly, let us know if consistently >140 and/or >90.  Follow-up: Return for will schedule pp visit after delivery.  Future Appointments  Date Time Provider Department Center  03/10/2023 12:00 AM [facility] SCHED ROOM [facility] None    No orders of the defined types were placed in this encounter.  Cheral Marker CNM, [facility] 03/06/2023 9:35 AM

## 2023-03-06 NOTE — Patient Instructions (Signed)
Donna Mcknight, thank you for choosing our office today! We appreciate the opportunity to meet your healthcare needs. You may receive a short survey by mail, e-mail, or through [employer]. If you are happy with your care we would appreciate if you could take just a few minutes to complete the survey questions. We read all of your comments and take your feedback very seriously. Thank you again for choosing our office.  Center for [employer] Team at [facility]  [facility] at [facility] ([address] [place], [place] [address]) Entrance C, located off of [address] Free 24/7 valet parking   CLASSES: Go to [email or web address] to register for classes (childbirth, breastfeeding, waterbirth, infant CPR, daddy bootcamp, etc.)  Call the office [phone]) or go to [facility] if: You begin to have strong, frequent contractions Your water breaks.  Sometimes it is a big gush of fluid, sometimes it is just a trickle that keeps getting your panties wet or running down your legs You have vaginal bleeding.  It is normal to have a small amount of spotting if your cervix was checked.  You don't feel your baby moving like normal.  If you don't, get you something to eat and drink and lay down and focus on feeling your baby move.   If your baby is still not moving like normal, you should call the office or go to [facility].  Call the office [phone]) or go to [facility] hospital for these signs of pre-eclampsia: Severe headache that does not go away with Tylenol Visual changes- seeing spots, double, blurred vision Pain under your right breast or upper abdomen that does not go away with Tums or heartburn medicine Nausea and/or vomiting Severe swelling in your hands, feet, and face   [facility] Pediatricians/Family Doctors [facility]): [address] Colette Ribas, [phone]           [facility]: [address], [phone]                 [facility]): [address], [phone] (call to ask if accepting patients) [facility] Department: [address], [place], [phone]    [facility] Pediatricians/Family Doctors [facility]): [address], [phone] [facility]: [address] [place], [phone] [facility]: [address], [phone]  [facility] Doctors  [facility]): [phone] [facility]: [address], [phone]   [facility] Doctors [facility]: [address] [phone]  [facility] Doctors  [facility]: [phone], [phone]  Home Blood Pressure Monitoring for Patients   Your provider has recommended that you check your blood pressure (BP) at least once a week at home. If you do not have a blood pressure cuff at home, one will be provided for you. Contact your provider if you have not received your monitor within 1 week.   Helpful Tips for Accurate Home Blood Pressure Checks  Don't smoke, exercise, or drink caffeine 30 minutes before checking your BP Use the restroom before checking your BP (a full bladder can raise your pressure) Relax in a comfortable upright chair Feet on the ground Left arm resting comfortably on a flat surface at the level of your heart Legs uncrossed Back supported Sit quietly and don't talk Place the cuff on your bare arm Adjust snuggly, so that only two fingertips  can fit between your skin and the top of the cuff Check 2 readings separated by at least one minute Keep a log of your BP readings For a visual, please reference this diagram: http://ccnc.care/bpdiagram  Provider Name: [facility]     Phone: [phone]  Zone 1: ALL CLEAR  Continue to monitor your symptoms:  BP reading is less than 140 (top number) or less than 90 (bottom number)  No right  upper stomach pain No headaches or seeing spots No feeling nauseated or throwing up No swelling in face and hands  Zone 2: CAUTION Call your doctor's office for any of the following:  BP reading is greater than 140 (top number) or greater than 90 (bottom number)  Stomach pain under your ribs in the middle or right side Headaches or seeing spots Feeling nauseated or throwing up Swelling in face and hands  Zone 3: EMERGENCY  Seek immediate medical care if you have any of the following:  BP reading is greater than160 (top number) or greater than 110 (bottom number) Severe headaches not improving with Tylenol Serious difficulty catching your breath Any worsening symptoms from Zone 2   Braxton Hicks Contractions Contractions of the uterus can occur throughout pregnancy, but they are not always a sign that you are in labor. You may have practice contractions called Braxton Hicks contractions. These false labor contractions are sometimes confused with true labor. What are [name] contractions? Braxton Hicks contractions are tightening movements that occur in the muscles of the uterus before labor. Unlike true labor contractions, these contractions do not result in opening (dilation) and thinning of the cervix. Toward the end of pregnancy (32-34 weeks), Braxton Hicks contractions can happen more often and may become stronger. These contractions are sometimes difficult to tell apart from true labor because they can be very uncomfortable. You should not feel embarrassed if you go to the hospital with false labor. Sometimes, the only way to tell if you are in true labor is for your health care provider to look for changes in the cervix. The health care provider will do a physical exam and may monitor your contractions. If you are not in true labor, the exam should show that your cervix is not dilating and your water has not broken. If there are no other health problems associated with your  pregnancy, it is completely safe for you to be sent home with false labor. You may continue to have Braxton Hicks contractions until you go into true labor. How to tell the difference between true labor and false labor True labor Contractions last 30-70 seconds. Contractions become very regular. Discomfort is usually felt in the top of the uterus, and it spreads to the lower abdomen and low back. Contractions do not go away with walking. Contractions usually become more intense and increase in frequency. The cervix dilates and gets thinner. False labor Contractions are usually shorter and not as strong as true labor contractions. Contractions are usually irregular. Contractions are often felt in the front of the lower abdomen and in the groin. Contractions may go away when you walk around or change positions while lying down. Contractions get weaker and are shorter-lasting as time goes on. The cervix usually does not dilate or become thin. Follow these instructions at home:  Take over-the-counter and prescription medicines only as told by your health care provider. Keep up with your usual exercises and follow other instructions from your health care provider. Eat and drink lightly if you think  you are going into labor. If Braxton Hicks contractions are making you uncomfortable: Change your position from lying down or resting to walking, or change from walking to resting. Sit and rest in a tub of warm water. Drink enough fluid to keep your urine pale yellow. Dehydration may cause these contractions. Do slow and deep breathing several times an hour. Keep all follow-up prenatal visits as told by your health care provider. This is important. Contact a health care provider if: You have a fever. You have continuous pain in your abdomen. Get help right away if: Your contractions become stronger, more regular, and closer together. You have fluid leaking or gushing from your vagina. You pass  blood-tinged mucus (bloody show). You have bleeding from your vagina. You have low back pain that you never had before. You feel your babys head pushing down and causing pelvic pressure. Your baby is not moving inside you as much as it used to. Summary Contractions that occur before labor are called Braxton Hicks contractions, false labor, or practice contractions. Braxton Hicks contractions are usually shorter, weaker, farther apart, and less regular than true labor contractions. True labor contractions usually become progressively stronger and regular, and they become more frequent. Manage discomfort from [facility] contractions by changing position, resting in a warm bath, drinking plenty of water, or practicing deep breathing. This information is not intended to replace advice given to you by your health care provider. Make sure you discuss any questions you have with your health care provider. Document Revised: 03/10/2017 Document Reviewed: 08/11/2016 Elsevier Patient Education  2020 [employer].

## 2023-03-08 ENCOUNTER — Inpatient Hospital Stay (HOSPITAL_COMMUNITY)
Admission: RE | Admit: 2023-03-08 | Discharge: 2023-03-09 | DRG: 807 | Disposition: A | Discharge status: Home / Self Care | Payer: BC Managed Care – PPO | Attending: Obstetrics & Gynecology | Admitting: Obstetrics & Gynecology

## 2023-03-08 ENCOUNTER — Other Ambulatory Visit: Payer: Self-pay

## 2023-03-08 ENCOUNTER — Encounter (HOSPITAL_COMMUNITY): Payer: Self-pay | Admitting: Obstetrics & Gynecology

## 2023-03-08 DIAGNOSIS — Z3A40 40 weeks gestation of pregnancy: Secondary | ICD-10-CM | POA: Diagnosis not present

## 2023-03-08 DIAGNOSIS — Z3490 Encounter for supervision of normal pregnancy, unspecified, unspecified trimester: Secondary | ICD-10-CM

## 2023-03-08 DIAGNOSIS — Z3483 Encounter for supervision of other normal pregnancy, third trimester: Secondary | ICD-10-CM

## 2023-03-08 DIAGNOSIS — Z833 Family history of diabetes mellitus: Secondary | ICD-10-CM

## 2023-03-08 DIAGNOSIS — O09299 Supervision of pregnancy with other poor reproductive or obstetric history, unspecified trimester: Secondary | ICD-10-CM

## 2023-03-08 DIAGNOSIS — Z8759 Personal history of other complications of pregnancy, childbirth and the puerperium: Secondary | ICD-10-CM

## 2023-03-08 DIAGNOSIS — O26893 Other specified pregnancy related conditions, third trimester: Secondary | ICD-10-CM | POA: Diagnosis not present

## 2023-03-08 DIAGNOSIS — O99344 Other mental disorders complicating childbirth: Secondary | ICD-10-CM | POA: Diagnosis not present

## 2023-03-08 DIAGNOSIS — Z8249 Family history of ischemic heart disease and other diseases of the circulatory system: Secondary | ICD-10-CM | POA: Diagnosis not present

## 2023-03-08 DIAGNOSIS — Z8632 Personal history of gestational diabetes: Secondary | ICD-10-CM

## 2023-03-08 DIAGNOSIS — O480 Post-term pregnancy: Principal | ICD-10-CM | POA: Diagnosis present

## 2023-03-08 LAB — CBC
HCT: 35.3 % — ABNORMAL LOW (ref 36.0–46.0)
Hemoglobin: 11.9 g/dL — ABNORMAL LOW (ref 12.0–15.0)
MCH: 29.4 pg (ref 26.0–34.0)
MCHC: 33.7 g/dL (ref 30.0–36.0)
MCV: 87.2 fL (ref 80.0–100.0)
Platelets: 237 10*3/uL (ref 150–400)
RBC: 4.05 MIL/uL (ref 3.87–5.11)
RDW: 14.2 % (ref 11.5–15.5)
WBC: 9.9 10*3/uL (ref 4.0–10.5)
nRBC: 0 % (ref 0.0–0.2)

## 2023-03-08 LAB — TYPE AND SCREEN
ABO/RH(D): O POS
Antibody Screen: NEGATIVE

## 2023-03-08 LAB — RPR: RPR Ser Ql: NONREACTIVE

## 2023-03-08 MED ORDER — SENNOSIDES-DOCUSATE SODIUM 8.6-50 MG PO TABS
2.0000 | ORAL_TABLET | ORAL | Status: DC
Start: 2023-03-09 — End: 2023-03-10
  Administered 2023-03-09: 2 via ORAL
  Filled 2023-03-08: qty 2

## 2023-03-08 MED ORDER — TERBUTALINE SULFATE 1 MG/ML IJ SOLN: 0.2500 mg | Freq: Once | INTRAMUSCULAR | Status: DC | PRN

## 2023-03-08 MED ORDER — OXYTOCIN BOLUS FROM INFUSION
333.0000 mL | Freq: Once | INTRAVENOUS | Status: AC
  Administered 2023-03-08: 333 mL via INTRAVENOUS

## 2023-03-08 MED ORDER — ONDANSETRON HCL 4 MG/2ML IJ SOLN: 4.0000 mg | INTRAMUSCULAR | Status: DC | PRN

## 2023-03-08 MED ORDER — IBUPROFEN 600 MG PO TABS
600.0000 mg | ORAL_TABLET | Freq: Four times a day (QID) | ORAL | Status: DC
  Administered 2023-03-08 – 2023-03-09 (×4): 600 mg via ORAL
  Filled 2023-03-08 (×4): qty 1

## 2023-03-08 MED ORDER — LIDOCAINE HCL (PF) 1 % IJ SOLN: 30.0000 mL | INTRAMUSCULAR | Status: DC | PRN

## 2023-03-08 MED ORDER — MISOPROSTOL 50MCG HALF TABLET
50.0000 ug | ORAL_TABLET | Freq: Once | ORAL | Status: AC
  Administered 2023-03-08: 50 ug via ORAL
  Filled 2023-03-08: qty 1

## 2023-03-08 MED ORDER — DIBUCAINE (PERIANAL) 1 % EX OINT: 1.0000 | TOPICAL_OINTMENT | CUTANEOUS | Status: DC | PRN

## 2023-03-08 MED ORDER — METHYLERGONOVINE MALEATE 0.2 MG/ML IJ SOLN: 0.2000 mg | INTRAMUSCULAR | Status: DC | PRN

## 2023-03-08 MED ORDER — FENTANYL CITRATE (PF) 100 MCG/2ML IJ SOLN: 100.0000 ug | INTRAMUSCULAR | Status: DC | PRN

## 2023-03-08 MED ORDER — HYDROXYZINE HCL 50 MG PO TABS: 50.0000 mg | ORAL_TABLET | Freq: Four times a day (QID) | ORAL | Status: DC | PRN

## 2023-03-08 MED ORDER — OXYCODONE-ACETAMINOPHEN 5-325 MG PO TABS: 1.0000 | ORAL_TABLET | ORAL | Status: DC | PRN

## 2023-03-08 MED ORDER — ACETAMINOPHEN 325 MG PO TABS: 650.0000 mg | ORAL_TABLET | ORAL | Status: DC | PRN

## 2023-03-08 MED ORDER — SIMETHICONE 80 MG PO CHEW: 80.0000 mg | CHEWABLE_TABLET | ORAL | Status: DC | PRN

## 2023-03-08 MED ORDER — DIPHENHYDRAMINE HCL 25 MG PO CAPS: 25.0000 mg | ORAL_CAPSULE | Freq: Four times a day (QID) | ORAL | Status: DC | PRN

## 2023-03-08 MED ORDER — BENZOCAINE-MENTHOL 20-0.5 % EX AERO: 1.0000 | INHALATION_SPRAY | CUTANEOUS | Status: DC | PRN

## 2023-03-08 MED ORDER — TETANUS-DIPHTH-ACELL PERTUSSIS 5-2.5-18.5 LF-MCG/0.5 IM SUSY: 0.5000 mL | PREFILLED_SYRINGE | Freq: Once | INTRAMUSCULAR | Status: DC

## 2023-03-08 MED ORDER — LACTATED RINGERS IV SOLN: INTRAVENOUS | Status: DC

## 2023-03-08 MED ORDER — COCONUT OIL OIL: 1.0000 | TOPICAL_OIL | Status: DC | PRN

## 2023-03-08 MED ORDER — ZOLPIDEM TARTRATE 5 MG PO TABS: 5.0000 mg | ORAL_TABLET | Freq: Every evening | ORAL | Status: DC | PRN

## 2023-03-08 MED ORDER — OXYCODONE-ACETAMINOPHEN 5-325 MG PO TABS: 2.0000 | ORAL_TABLET | ORAL | Status: DC | PRN

## 2023-03-08 MED ORDER — LACTATED RINGERS IV SOLN
500.0000 mL | INTRAVENOUS | Status: DC | PRN
Start: 2023-03-08 — End: 2023-03-08

## 2023-03-08 MED ORDER — ONDANSETRON HCL 4 MG/2ML IJ SOLN
4.0000 mg | Freq: Four times a day (QID) | INTRAMUSCULAR | Status: DC | PRN
  Administered 2023-03-08: 4 mg via INTRAVENOUS
  Filled 2023-03-08: qty 2

## 2023-03-08 MED ORDER — PRENATAL MULTIVITAMIN CH
1.0000 | ORAL_TABLET | Freq: Every day | ORAL | Status: DC
  Administered 2023-03-09: 1 via ORAL
  Filled 2023-03-08: qty 1

## 2023-03-08 MED ORDER — MISOPROSTOL 25 MCG QUARTER TABLET
25.0000 ug | ORAL_TABLET | Freq: Once | ORAL | Status: AC
  Administered 2023-03-08: 25 ug via VAGINAL
  Filled 2023-03-08: qty 1

## 2023-03-08 MED ORDER — WITCH HAZEL-GLYCERIN EX PADS: 1.0000 | MEDICATED_PAD | CUTANEOUS | Status: DC | PRN

## 2023-03-08 MED ORDER — OXYTOCIN-SODIUM CHLORIDE 30-0.9 UT/500ML-% IV SOLN
2.5000 [IU]/h | INTRAVENOUS | Status: DC
  Administered 2023-03-08: 2.5 [IU]/h via INTRAVENOUS

## 2023-03-08 MED ORDER — TRANEXAMIC ACID-NACL 1000-0.7 MG/100ML-% IV SOLN
1000.0000 mg | Freq: Once | INTRAVENOUS | Status: AC
  Administered 2023-03-08: 1000 mg via INTRAVENOUS
  Filled 2023-03-08: qty 100

## 2023-03-08 MED ORDER — METHYLERGONOVINE MALEATE 0.2 MG PO TABS
0.2000 mg | ORAL_TABLET | ORAL | Status: DC | PRN
  Administered 2023-03-08: 0.2 mg via ORAL
  Filled 2023-03-08: qty 1

## 2023-03-08 MED ORDER — OXYTOCIN-SODIUM CHLORIDE 30-0.9 UT/500ML-% IV SOLN: 1.0000 m[IU]/min | INTRAVENOUS | Status: DC

## 2023-03-08 MED ORDER — FLEET ENEMA RE ENEM: 1.0000 | ENEMA | RECTAL | Status: DC | PRN

## 2023-03-08 MED ORDER — SOD CITRATE-CITRIC ACID 500-334 MG/5ML PO SOLN
30.0000 mL | ORAL | Status: DC | PRN
Start: 2023-03-08 — End: 2023-03-08

## 2023-03-08 MED ORDER — ONDANSETRON HCL 4 MG PO TABS: 4.0000 mg | ORAL_TABLET | ORAL | Status: DC | PRN

## 2023-03-08 MED ORDER — OXYTOCIN-SODIUM CHLORIDE 30-0.9 UT/500ML-% IV SOLN
1.0000 m[IU]/min | INTRAVENOUS | Status: DC
  Administered 2023-03-08: 2 m[IU]/min via INTRAVENOUS
  Filled 2023-03-08: qty 500

## 2023-03-08 NOTE — Progress Notes (Signed)
LABOR PROGRESS NOTE  Donna Mcknight is a 27 y.o. G2P1001 at [redacted]w[redacted]d  admitted for elective induction of labor  Subjective: Patient doing well, feeling occasional contractions.  Objective: BP 113/75   Pulse 90   Temp 98.7 F (37.1 C) (Oral)   Resp 16   Ht 5\' 6"  (1.676 m)   Wt 92.2 kg   LMP 05/26/2022 (Approximate)   BMI 32.81 kg/m  or  Vitals:   03/08/23 0651 03/08/23 0700 03/08/23 0925 03/08/23 1130  BP:  129/81 126/86 113/75  Pulse:  85 70 90  Resp:  14 14 16   Temp:  98.7 F (37.1 C)    TempSrc:  Oral    Weight: 92.2 kg 92.2 kg    Height: 5\' 6"  (1.676 m) 5\' 6"  (1.676 m)      Dilation: 4 Effacement (%): 70 Cervical Position: Anterior Station: -1 Presentation: Vertex Exam by:: Dr. Nobie Putnam FHT: baseline rate 130, moderate varibility, positive acel, no decel Toco: Infrequent  Labs: Lab Results  Component Value Date   WBC 9.9 03/08/2023   HGB 11.9 (L) 03/08/2023   HCT 35.3 (L) 03/08/2023   MCV 87.2 03/08/2023   PLT 237 03/08/2023    Patient Active Problem List   Diagnosis Date Noted   Post-dates pregnancy 03/08/2023   History of gestational diabetes in prior pregnancy, currently pregnant 08/25/2022   Supervision of normal pregnancy 08/04/2022   Anxiety and depression 06/30/2022   History of postpartum hemorrhage 04/18/2021    Assessment / Plan: 27 y.o. G2P1001 at [redacted]w[redacted]d here for elective induction of labor  Labor: Not parched progression from previous check.  S/p Cytotec.  Discussed risk and benefits of AROM and patient agreeable.  Rupture of membranes with clear fluid.  Patient tolerated procedure well.  Will monitor and if no regular contractions in 1 to 2 hours consider initiation of Pitocin Fetal Wellbeing: Category 1, reassuring Pain Control: Per patient request Anticipated MOD: Vaginal delivery  Derrel Nip, MD Attending Family Medicine Physician, [facility] for [facility], [facility]  03/08/2023  1:12 PM

## 2023-03-08 NOTE — Discharge Summary (Signed)
Postpartum Discharge Summary  Date of Service updated***     Patient Name: Donna Mcknight DOB: Feb 25, 1996 MRN: [id number]  Date of admission: 03/08/2023 Delivery date:03/08/2023 Delivering provider: Celedonio Savage Date of discharge: 03/08/2023  Admitting diagnosis: Post-dates pregnancy [O48.0] Intrauterine pregnancy: [redacted]w[redacted]d     Secondary diagnosis:  Principal Problem:   Post-dates pregnancy Active Problems:   History of postpartum hemorrhage   Supervision of normal pregnancy   History of gestational diabetes in prior pregnancy, currently pregnant  Additional problems: ***    Discharge diagnosis: Term Pregnancy Delivered                                              Post partum procedures:{Postpartum procedures:23558} Augmentation: AROM, Pitocin, and Cytotec Complications: None  Hospital course: Induction of Labor With Vaginal Delivery   27 y.o. yo G2P1001 at [redacted]w[redacted]d was admitted to the hospital 03/08/2023 for induction of labor.  Indication for induction: Elective.  Patient had an uncomplicated  Membrane Rupture Time/Date: 12:08 PM,03/08/2023  Delivery Method:Vaginal, Spontaneous Operative Delivery:{Operative Delivery:30121} Episiotomy: None Lacerations:  None Details of delivery can be found in separate delivery note.  Patient had a postpartum course complicated by***. Patient is discharged home 03/08/23.  Newborn Data: Birth date:03/08/2023 Birth time:7:07 PM Gender:Female Living status:Living Apgars:9 ,9  Weight:   Magnesium Sulfate received: No BMZ received: No Rhophylac:N/A MMR:N/A T-DaP:Given prenatally Flu: {XLK:44010} RSV Vaccine received: {RSV:31013} Transfusion:{Transfusion received:30440034}  Immunizations received: Immunization History  Administered Date(s) Administered   Tdap 08/15/2006, 01/08/2021, 12/05/2022   Varicella 10/27/1995, 08/15/2006    Physical exam  Vitals:   03/08/23 1443 03/08/23 1519 03/08/23 1744 03/08/23 1915   BP: 113/72 113/76 115/68 120/61  Pulse: 93 78 88 84  Resp: 14 16 18    Temp:  97.9 F (36.6 C) 98 F (36.7 C)   TempSrc:  Oral Oral   Weight:      Height:       General: {Exam; general:21111117} Lochia: {Desc; appropriate/inappropriate:30686::"appropriate"} Uterine Fundus: {Desc; firm/soft:30687} Incision: {Exam; incision:21111123} DVT Evaluation: {Exam; dvt:2111122} Labs: Lab Results  Component Value Date   WBC 9.9 03/08/2023   HGB 11.9 (L) 03/08/2023   HCT 35.3 (L) 03/08/2023   MCV 87.2 03/08/2023   PLT 237 03/08/2023      Latest Ref Rng & Units 06/30/2022    9:31 AM  CMP  Glucose 70 - 99 mg/dL 90   BUN 6 - 20 mg/dL 12   Creatinine 2.72 - 1.00 mg/dL 5.36   Sodium 644 - 034 mmol/L 137   Potassium 3.5 - 5.2 mmol/L 4.2   Chloride 96 - 106 mmol/L 100   CO2 20 - 29 mmol/L 22   Calcium 8.7 - 10.2 mg/dL 9.8   Total Protein 6.0 - 8.5 g/dL 7.3   Total Bilirubin 0.0 - 1.2 mg/dL 0.5   Alkaline Phos 44 - 121 IU/L 92   AST 0 - 40 IU/L 14   ALT 0 - 32 IU/L 11    Edinburgh Score:    08/23/2021    8:55 AM  Edinburgh Postnatal Depression Scale Screening Tool  I have been able to laugh and see the funny side of things. 0  I have looked forward with enjoyment to things. 0  I have blamed myself unnecessarily when things went wrong. 1  I have been anxious or worried for no good reason.  2  I have felt scared or panicky for no good reason. 2  Things have been getting on top of me. 0  I have been so unhappy that I have had difficulty sleeping. 0  I have felt sad or miserable. 0  I have been so unhappy that I have been crying. 1  The thought of harming myself has occurred to me. 0  Edinburgh Postnatal Depression Scale Total 6   No data recorded  After visit meds:  Allergies as of 03/08/2023       Reactions   Nickel    Other reaction(s): edema   Other Hives, Swelling   Nickel     Med Rec must be completed prior to using this SMARTLINK***        Discharge home in  stable condition Infant Feeding: {Baby feeding:23562} Infant Disposition:{CHL IP OB HOME WITH WUJWJX:91478} Discharge instruction: per After Visit Summary and Postpartum booklet. Activity: Advance as tolerated. Pelvic rest for 6 weeks.  Diet: {OB GNFA:21308657} Future Appointments:No future appointments. Follow up Visit:   Please schedule this patient for a In person postpartum visit in 4 weeks with the following provider: Any provider. Additional Postpartum F/U: None   Low risk pregnancy complicated by:  NA Delivery mode:  Vaginal, Spontaneous Anticipated Birth Control:   Plans OP IUD   03/08/2023 Celedonio Savage, MD

## 2023-03-08 NOTE — Plan of Care (Signed)
  Problem: Education: Goal: Knowledge of General Education information will improve Description: Including pain rating scale, medication(s)/side effects and non-pharmacologic comfort measures Outcome: Completed/Met   Problem: Health Behavior/Discharge Planning: Goal: Ability to manage health-related needs will improve Outcome: Completed/Met   Problem: Clinical Measurements: Goal: Ability to maintain clinical measurements within normal limits will improve Outcome: Completed/Met Goal: Will remain free from infection Outcome: Completed/Met Goal: Diagnostic test results will improve Outcome: Completed/Met Goal: Respiratory complications will improve Outcome: Completed/Met Goal: Cardiovascular complication will be avoided Outcome: Completed/Met   Problem: Activity: Goal: Risk for activity intolerance will decrease Outcome: Completed/Met   Problem: Nutrition: Goal: Adequate nutrition will be maintained Outcome: Completed/Met   Problem: Coping: Goal: Level of anxiety will decrease Outcome: Completed/Met   Problem: Elimination: Goal: Will not experience complications related to bowel motility Outcome: Completed/Met Goal: Will not experience complications related to urinary retention Outcome: Completed/Met   Problem: Pain Management: Goal: General experience of comfort will improve Outcome: Completed/Met   Problem: Safety: Goal: Ability to remain free from injury will improve Outcome: Completed/Met   Problem: Skin Integrity: Goal: Risk for impaired skin integrity will decrease Outcome: Completed/Met   Problem: Education: Goal: Knowledge of Childbirth will improve Outcome: Completed/Met Goal: Ability to make informed decisions regarding treatment and plan of care will improve Outcome: Completed/Met Goal: Ability to state and carry out methods to decrease the pain will improve Outcome: Completed/Met Goal: Individualized Educational Video(s) Outcome: Completed/Met    Problem: Coping: Goal: Ability to verbalize concerns and feelings about labor and delivery will improve Outcome: Completed/Met   Problem: Life Cycle: Goal: Ability to make normal progression through stages of labor will improve Outcome: Completed/Met Goal: Ability to effectively push during vaginal delivery will improve Outcome: Completed/Met   Problem: Role Relationship: Goal: Will demonstrate positive interactions with the child Outcome: Completed/Met   Problem: Safety: Goal: Risk of complications during labor and delivery will decrease Outcome: Completed/Met   Problem: Pain Management: Goal: Relief or control of pain from uterine contractions will improve Outcome: Completed/Met

## 2023-03-08 NOTE — H&P (Addendum)
OBSTETRIC ADMISSION HISTORY AND PHYSICAL  Donna Mcknight is a 27 y.o. female G2P1001 with IUP at [redacted]w[redacted]d presenting for eIOL. She reports +FMs, No LOF, no VB, no blurry vision, headaches or peripheral edema, and RUQ pain.  She plans on formula feeding. She request copper IUD at 6 week visit for birth control. She received her prenatal care at [facility]   Dating: By LMP --->  Estimated Date of Delivery: 03/02/23  Sono:    @[redacted]w[redacted]d , CWD, normal anatomy, cephalic presentation, 314g, 49% EFW   Prenatal History/Complications:  Low risk pregnancy with no complications. Prenatal records reviewed.  Past Medical History: Past Medical History:  Diagnosis Date   Anemia    as teenager   Anxiety    Breech presentation of fetus 02/15/2023   Depression    Gestational diabetes    first preg   History of postpartum hemorrhage, currently pregnant    History of postpartum hemorrhage, currently pregnant    Mental disorder    anxiety, depression   UTI (urinary tract infection)    Vaginal bleeding in pregnancy, third trimester 01/09/2023    Past Surgical History: Past Surgical History:  Procedure Laterality Date   MOUTH SURGERY      Obstetrical History: OB History  Gravida Para Term Preterm AB Living  2 1 1  0 0 1  SAB IAB Ectopic Multiple Live Births  0 0 0 0 1    # Outcome Date GA Lbr Len/2nd Weight Sex Type Anes PTL Lv  2 Current           1 Term 04/18/21 [redacted]w[redacted]d 06:03 / 00:42 3459 g F Vag-Spont Local N LIV     Birth Comments: none     Complications: Gestational diabetes     Social History Social History   Socioeconomic History   Marital status: Married    Spouse name: Not on file   Number of children: 0   Years of education: Not on file   Highest education level: Not on file  Occupational History   Not on file  Tobacco Use   Smoking status: Never   Smokeless tobacco: Never   Tobacco comments:    Quit 2014  Vaping Use   Vaping status: Former  Substance and  Sexual Activity   Alcohol use: Not Currently    Alcohol/week: 10.0 standard drinks of alcohol    Types: 10 Glasses of wine per week    Comment: almost everyday   Drug use: Not Currently    Comment: prior to preg   Sexual activity: Yes  Other Topics Concern   Not on file  Social History Narrative   Not on file   Social Determinants of Health   Financial Resource Strain: Low Risk  (06/30/2022)   Overall Financial Resource Strain (CARDIA)    Difficulty of Paying Living Expenses: Not hard at all  Food Insecurity: No Food Insecurity (03/08/2023)   Hunger Vital Sign    Worried About Running Out of Food in the Last Year: Never true    Ran Out of Food in the Last Year: Never true  Transportation Needs: No Transportation Needs (03/08/2023)   PRAPARE - Administrator, Civil Service (Medical): No    Lack of Transportation (Non-Medical): No  Physical Activity: Insufficiently Active (06/30/2022)   Exercise Vital Sign    Days of Exercise per Week: 1 day    Minutes of Exercise per Session: 10 min  Stress: Stress Concern Present (06/30/2022)   [employer] of  Occupational Health - Occupational Stress Questionnaire    Feeling of Stress : To some extent  Social Connections: Socially Isolated (06/30/2022)   Social Connection and Isolation Panel [NHANES]    Frequency of Communication with Friends and Family: Once a week    Frequency of Social Gatherings with Friends and Family: Never    Attends Religious Services: Never    Database administrator or Organizations: No    Attends Engineer, structural: Never    Marital Status: Married    Family History: Family History  Problem Relation Age of Onset   Cancer Maternal Grandmother    Diabetes Maternal Grandfather    Cirrhosis Father    Hypertension Mother    Fibroids Mother     Allergies: Allergies  Allergen Reactions   Nickel     Other reaction(s): edema   Other Hives and Swelling    Nickel      Medications  Prior to Admission  Medication Sig Dispense Refill Last Dose   acetaminophen (TYLENOL) 325 MG tablet Take 650 mg by mouth every 6 (six) hours as needed.   03/08/2023   ferrous sulfate 325 (65 FE) MG tablet Take 1 tablet (325 mg total) by mouth every other day. 45 tablet 2 03/07/2023   omeprazole (PRILOSEC) 40 MG capsule Take 1 capsule (40 mg total) by mouth daily. 30 capsule 2 03/07/2023   Prenatal Vit-Fe Fumarate-FA (PRENATAL VITAMIN PO) Take by mouth.   03/07/2023     Review of Systems   All systems reviewed and negative except as stated in HPI  Blood pressure 129/81, pulse 85, temperature 98.7 F (37.1 C), temperature source Oral, resp. rate 14, height 5\' 6"  (1.676 m), weight 92.2 kg, last menstrual period 05/26/2022, not currently breastfeeding. General appearance: alert, cooperative, and no distress Lungs: clear to auscultation bilaterally Heart: regular rate and rhythm Abdomen: soft, non-tender; bowel sounds normal Extremities: Homans sign is negative, no sign of DVT Presentation: cephalic Fetal monitoringBaseline: 135 bpm, Variability: Good {> 6 bpm), Accelerations: Reactive, and Decelerations: Absent Uterine activityFrequency: Every 15 minutes Exam by:: Dr. Judd Lien   Prenatal labs: ABO, Rh: --/--/O POS (11/06 0805) Antibody: NEG (11/06 0805) Rubella: 1.45 (04/25 0839) RPR: Non Reactive (08/26 0840)  HBsAg: Negative (04/25 0839)  HIV: Non Reactive (08/26 0840)  GBS: Negative/-- (10/28 1330)  1 hr Glucola passed Genetic screening mormal Anatomy US normal  Prenatal Transfer Tool  Maternal Diabetes: No Genetic Screening: Normal Maternal Ultrasounds/Referrals: Normal Fetal Ultrasounds or other Referrals:  Referred to Materal Fetal Medicine  for vaginal bleeding during third trimester Maternal Substance Abuse:  No Significant Maternal Medications:  None Significant Maternal Lab Results:  None Number of Prenatal Visits:greater than 3 verified prenatal visits Other  Comments:  None  No results found for this or any previous visit (from the past 24 hour(s)).  Patient Active Problem List   Diagnosis Date Noted   Post-dates pregnancy 03/08/2023   History of gestational diabetes in prior pregnancy, currently pregnant 08/25/2022   Supervision of normal pregnancy 08/04/2022   Anxiety and depression 06/30/2022   History of postpartum hemorrhage 04/18/2021    Assessment/Plan:  Donna Mcknight is a 27 y.o. G2P1001 at [redacted]w[redacted]d here for induction of labor.   #Labor:Patient has prior successful spontaneous vaginal delivery on Cytotec would like to opt for cytotec first not pitocin  #Pain: Patient would like NO for pain management  #FWB: Cat 1 #ID: GBS - #MOF: formula feeding #MOC: copper IUD 6 weeks post partum #Circ:  N/A  Laurier Nancy, Medical Student  03/08/2023, 7:39 AM  Attending Attestation  I saw and evaluated the patient, performing the key elements of the service.I  personally performed or re-performed the history, physical exam, and medical decision making activities of this service and have verified that the service and findings are accurately documented in the student's note. I developed the management plan that is described in the medical student's note, and I agree with the content, with my edits above.    Derrel Nip, MD Attending Family Medicine Physician, [facility] for [facility], [facility]

## 2023-03-09 MED ORDER — ACETAMINOPHEN 325 MG PO TABS: 650.0000 mg | ORAL_TABLET | ORAL | Status: DC | PRN

## 2023-03-09 MED ORDER — IBUPROFEN 600 MG PO TABS: 600.0000 mg | ORAL_TABLET | Freq: Four times a day (QID) | ORAL | 0 refills | Status: DC

## 2023-03-09 NOTE — Progress Notes (Signed)
Pt with an [place] score of a 13, so a social work consult was ordered. The pt has a discharge order so Drenda Freeze Dishmon-Cresenzo, CNM was contacted due to social work being unavailable at this time. The CNM contacted the patients OB office to schedule a one week mood check. Pt updated accordingly.

## 2023-03-09 NOTE — Progress Notes (Addendum)
MOB was referred for history of depression/anxiety. * Referral screened out by Clinical Social Worker because none of the following criteria appear to apply: ~ History of anxiety/depression during this pregnancy, or of post-partum depression following prior delivery. ~ Diagnosis of anxiety and/or depression within last 3 years. No concerns were noted in [facility] records. OR * MOB's symptoms currently being treated with medication and/or therapy.  Please contact the Clinical Social Worker if needs arise, by [facility] request, or if MOB scores greater than 9/yes to question 10 on Edinburgh Postpartum Depression Screen.   Blaine Hamper, MSW, LCSW Clinical Social Work [phone]

## 2023-03-10 ENCOUNTER — Telehealth (HOSPITAL_COMMUNITY): Payer: Self-pay | Admitting: *Deleted

## 2023-03-10 ENCOUNTER — Inpatient Hospital Stay (HOSPITAL_COMMUNITY): Payer: BC Managed Care – PPO

## 2023-03-10 DIAGNOSIS — Z1331 Encounter for screening for depression: Secondary | ICD-10-CM

## 2023-03-10 NOTE — Telephone Encounter (Signed)
Patient scored 13 on EPDS in the hospital (question ten answer was "0-Never").  Placed order for [facility] referral.  Dr. Vergie Living notified via [facility] order.  Salena Saner, RN 03/10/2023 09:24

## 2023-03-15 ENCOUNTER — Telehealth (INDEPENDENT_AMBULATORY_CARE_PROVIDER_SITE_OTHER): Payer: BC Managed Care – PPO | Admitting: Women's Health

## 2023-03-15 ENCOUNTER — Encounter: Payer: Self-pay | Admitting: Women's Health

## 2023-03-15 VITALS — BP 123/87 | HR 82

## 2023-03-15 DIAGNOSIS — Z3009 Encounter for other general counseling and advice on contraception: Secondary | ICD-10-CM

## 2023-03-15 DIAGNOSIS — F418 Other specified anxiety disorders: Secondary | ICD-10-CM

## 2023-03-15 DIAGNOSIS — Z8659 Personal history of other mental and behavioral disorders: Secondary | ICD-10-CM | POA: Diagnosis not present

## 2023-03-15 DIAGNOSIS — Z8759 Personal history of other complications of pregnancy, childbirth and the puerperium: Secondary | ICD-10-CM | POA: Diagnosis not present

## 2023-03-15 NOTE — Progress Notes (Signed)
TELEHEALTH VIRTUAL GYN VISIT ENCOUNTER NOTE Patient name: Donna Mcknight MRN [id number]  Date of birth: 1995/09/07  I connected with patient on 03/15/23 at  1:50 PM EST by MyChart video  and verified that I am speaking with the correct person using two identifiers.  Pt is not currently in the office, she is at home.  Provider is in the office.    I discussed the limitations, risks, security and privacy concerns of performing an evaluation and management service by telephone and the availability of in person appointments. I also discussed with the patient that there may be a patient responsible charge related to this service. The patient expressed understanding and agreed to proceed.   Chief Complaint:   Postpartum Care (Video visit- mood and BP check)  History of Present Illness:   Donna Mcknight is a 27 y.o. G102P2002 Caucasian female 7d s/p SVB being evaluated today for postpartum mood check. H/O dep/anx, and pp depression w/ 1st, not on any medicines right now. Feels she is doing well, doesn't feel like she's having any ppd this time. EPDS 3. Did have to start meds last time, declines starting prophylactically. Bottlefeeding. Plans IUD at pp visit.        08/25/2022    9:46 AM 06/30/2022    8:46 AM 08/23/2021    8:38 AM 06/21/2021    4:28 PM 01/08/2021    9:07 AM  Depression screen PHQ 2/9  Decreased Interest 0 1 0 1 0  Down, Depressed, Hopeless 0 1 0 1 0  PHQ - 2 Score 0 2 0 2 0  Altered sleeping 3 0 0 1 0  Tired, decreased energy 1 3 1 3 2   Change in appetite 3 2 0 0 1  Feeling bad or failure about yourself  0 1 0 1 0  Trouble concentrating 0 1 0 1 0  Moving slowly or fidgety/restless 0 0 0 0 0  Suicidal thoughts 0 1 0 0 0  PHQ-9 Score 7 10 1 8 3     Patient's last menstrual period was 05/26/2022 (approximate). Review of Systems:   Pertinent items are noted in HPI Denies fever/chills, dizziness, headaches, visual disturbances, fatigue, shortness of breath,  chest pain, abdominal pain, vomiting, abnormal vaginal discharge/itching/odor/irritation, problems with periods, bowel movements, urination, or intercourse unless otherwise stated above.  Pertinent History Reviewed:  Reviewed past medical,surgical, social, obstetrical and family history.  Reviewed problem list, medications and allergies. Physical Assessment:   Vitals:   03/15/23 1359  BP: 123/87  Pulse: 82  There is no height or weight on file to calculate BMI.       Physical Examination:   General:  Alert, oriented and cooperative.   Mental Status: Normal mood and affect perceived. Normal judgment and thought content.  Physical exam deferred due to nature of the encounter  No results found for this or any previous visit (from the past 24 hour(s)).  Assessment & Plan:  1) 1wk s/p SVB> bottlefeeding  2) Dep/anx, h/o PPD> no meds, doing well, let us know if anything changes  3) Contraception counseling> plans Paragard IUD at ppv, abstinence until then  Meds: No orders of the defined types were placed in this encounter.   No orders of the defined types were placed in this encounter.   I discussed the assessment and treatment plan with the patient. The patient was provided an opportunity to ask questions and all were answered. The patient agreed with the plan and demonstrated an understanding  of the instructions.   The patient was advised to call back or seek an in-person evaluation/go to the ED if the symptoms worsen or if the condition fails to improve as anticipated.  I provided 5 minutes of non-face-to-face time during this encounter.   No follow-ups on file.  Cheral Marker CNM, [facility] 03/15/2023 2:10 PM

## 2023-03-24 ENCOUNTER — Other Ambulatory Visit: Payer: Self-pay | Admitting: Women's Health

## 2023-04-13 ENCOUNTER — Ambulatory Visit: Payer: BC Managed Care – PPO | Admitting: Advanced Practice Midwife

## 2023-04-13 ENCOUNTER — Encounter: Payer: Self-pay | Admitting: Advanced Practice Midwife

## 2023-04-13 DIAGNOSIS — Z30430 Encounter for insertion of intrauterine contraceptive device: Secondary | ICD-10-CM | POA: Diagnosis not present

## 2023-04-13 DIAGNOSIS — Z3202 Encounter for pregnancy test, result negative: Secondary | ICD-10-CM | POA: Diagnosis not present

## 2023-04-13 LAB — POCT URINE PREGNANCY: Preg Test, Ur: NEGATIVE

## 2023-04-13 MED ORDER — PARAGARD INTRAUTERINE COPPER IU IUD
1.0000 | INTRAUTERINE_SYSTEM | Freq: Once | INTRAUTERINE | Status: AC
  Administered 2023-04-13: 1 via INTRAUTERINE

## 2023-04-13 NOTE — Patient Instructions (Signed)
 Nothing in vagina for 3 days (no sex, douching, tampons, etc...) Check your strings once a month after your period, to make sure you can feel them, if you are not able to please let us  know If you develop a fever of 100.4 or more in the next few weeks, or if you develop severe abdominal pain, please let us  know

## 2023-04-13 NOTE — Progress Notes (Signed)
 Post Partum Visit Note   Chief Complaint:   Postpartum Care  History of Present Illness:   Donna Mcknight is a 28 y.o. G28P2002  female being seen today for a postpartum visit. She is 6 weeks postpartum following a spontaneous vaginal delivery at 40 6 gestational weeks. IOL: Yes, for Elective. Anesthesia: none.  Laceration: none.  Complications: none. Inpatient contraception: no.   Pregnancy uncomplicated. Tobacco use: no. Substance use disorder: no. Last pap smear:     Component Value Date/Time   DIAGPAP  08/17/2020 0929    - Negative for intraepithelial lesion or malignancy (NILM)   HPVHIGH Negative 08/17/2020 0929   ADEQPAP  08/17/2020 0929    Satisfactory for evaluation; transformation zone component PRESENT.     Next pap smear due: 5/25 Patient's last menstrual period was 05/26/2022 (approximate).  Postpartum course has been uncomplicated. Bleeding no bleeding. Bowel function is normal. Bladder function is normal. Urinary incontinence? No, fecal incontinence? No Patient is not sexually active. Last sexual activity: prior to birth.    Upstream - 04/13/23 1041       Pregnancy Intention Screening   Does the patient want to become pregnant in the next year? No    Does the patient's partner want to become pregnant in the next year? No    Would the patient like to discuss contraceptive options today? Yes      Contraception Wrap Up   Current Method IUD or IUS    End Method IUD or IUS    Contraception Counseling Provided Yes            The pregnancy intention screening data noted above was reviewed. Potential methods of contraception were discussed. The patient elected to proceed with IUD or IUS.  Edinburgh Postpartum Depression Screening: Negative  Edinburgh Postnatal Depression Scale - 04/13/23 1038       Edinburgh Postnatal Depression Scale:  In the Past 7 Days   I have been able to laugh and see the funny side of things. 0    I have looked forward with  enjoyment to things. 0    I have blamed myself unnecessarily when things went wrong. 0    I have been anxious or worried for no good reason. 1    I have felt scared or panicky for no good reason. 0    Things have been getting on top of me. 1    I have been so unhappy that I have had difficulty sleeping. 0    I have felt sad or miserable. 0    I have been so unhappy that I have been crying. 1    The thought of harming myself has occurred to me. 0    Edinburgh Postnatal Depression Scale Total 3            Baby's course has been uncomplicated. Baby is feeding by bottle. Infant has a pediatrician/family doctor? Yes.  Childcare strategy if returning to work/school: yes.  Pt has material needs met for her and baby: Yes.   Review of Systems:   Pertinent items are noted in HPI Denies Abnormal vaginal discharge w/ itching/odor/irritation, headaches, visual changes, shortness of breath, chest pain, abdominal pain, severe nausea/vomiting, or problems with urination or bowel movements. Pertinent History Reviewed:  Reviewed past medical,surgical, obstetrical and family history.  Reviewed problem list, medications and allergies. OB History  Gravida Para Term Preterm AB Living  2 2 2  0 0 2  SAB IAB Ectopic Multiple Live Births  0  0 0 0 2    # Outcome Date GA Lbr Len/2nd Weight Sex Type Anes PTL Lv  2 Term 03/08/23 [redacted]w[redacted]d 06:54 / 00:05 9 lb 1.3 oz (4.12 kg) F Vag-Spont None  LIV  1 Term 04/18/21 [redacted]w[redacted]d 06:03 / 00:42 7 lb 10 oz (3.459 kg) F Vag-Spont Local N LIV     Birth Comments: none     Complications: Gestational diabetes   Physical Assessment:   Vitals:   04/13/23 1039  BP: 120/69  Pulse: 64  Weight: 186 lb (84.4 kg)  Height: 5' 5.5 (1.664 m)  Body mass index is 30.48 kg/m.  Objective:  Blood pressure 120/69, pulse 64, height 5' 5.5 (1.664 m), weight 186 lb (84.4 kg), last menstrual period 05/26/2022, not currently breastfeeding.  General:  alert, cooperative, and no distress    Breasts:  negative  Lungs: Normal respiratory effort  Heart:  regular rate and rhythm  Abdomen: soft, non-tender,    Vulva:  normal  Vagina: normal vagina  Cervix:  normal  Corpus: Well involuted  Adnexa:  not evaluated  Rectal Exam: no hemorrhoids          No results found for this or any previous visit (from the past 24 hours).  Assessment & Plan:  1) Postpartum exam 2) 6 wks s/p spontaneous vaginal delivery 3) bottle feeding 4) Depression screening 5) Contraception management: IUD placed  PARAGARD  INSERTION: The risks and benefits of the method and placement have been thouroughly reviewed with the patient and all questions were answered.  Specifically the patient is aware of failure rate of 04/998, expulsion of the IUD and of possible perforation.  The patient is aware of irregular bleeding due to the method and understands the incidence of irregular bleeding diminishes with time.  Time out was performed.  A Graves speculum was placed.  The cervix was prepped using Betadine. The uterus was found to be retroflexed and it sounded to 8 cm.  The cervix was grasped with a tenaculum and the IUD was inserted to 8 cm.  It was pulled back 1 cm and the IUD was disengaged.  The strings were trimmed to 3 cm.  Sonogram was performed and the proper placement of the IUD was verified.  The patient was instructed on signs and symptoms of infection and to check for the strings after each menses or each month.  The patient is to refrain from intercourse for 3 days.  Essential components of care per ACOG recommendations:  1.  Mood and well being:  If positive depression screen, discussed and plan developed.  If using tobacco we discussed reduction/cessation and risk of relapse If current substance abuse, we discussed and referral to local resources was offered.   2. Infant care and feeding:  If breastfeeding, discussed returning to work, pumping, breastfeeding-associated pain, guidance  regarding return to fertility while lactating if not using another method. If needed, patient was provided with a letter to be allowed to pump q 2-3hrs to support lactation in a private location with access to a refrigerator to store breastmilk.   Recommended that all caregivers be immunized for flu, pertussis and other preventable communicable diseases If pt does not have material needs met for her/baby, referred to local resources for help obtaining these.  3. Sexuality, contraception and birth spacing Provided guidance regarding sexuality, management of dyspareunia, and resumption of intercourse Discussed avoiding interpregnancy interval <67mths and recommended birth spacing of 18 months  4. Sleep and fatigue Discussed coping options for fatigue  and sleep disruption Encouraged family/partner/community support of 4 hrs of uninterrupted sleep to help with mood and fatigue  5. Physical recovery  If pt had a C/S, assessed incisional pain and providing guidance on normal vs prolonged recovery If pt had a laceration, perineal healing and pain reviewed.  If urinary or fecal incontinence, discussed management and referred to PT or uro/gyn if indicated  Patient is safe to resume physical activity. Discussed attainment of healthy weight.  6.  Chronic disease management Discussed pregnancy complications if any, and their implications for future childbearing and long-term maternal health. Review recommendations for prevention of recurrent pregnancy complications, such as aspirin  to reduce risk of preeclampsia not applicable. Pt had GDM: No. If yes, 2hr GTT scheduled: not applicable. Reviewed medications and non-pregnant dosing including consideration of whether pt is breastfeeding using a reliable resource such as LactMed: not applicable Referred for f/u w/ PCP or subspecialist providers as indicated: not applicable  7. Health maintenance Mammogram at 29yo or earlier if indicated Pap smears as  indicated  Meds: No orders of the defined types were placed in this encounter.   Follow-up: Return in about 4 weeks (around 05/11/2023) for IUD check.   No orders of the defined types were placed in this encounter.      Cathlean Ely DNP, CNM [facility] for [employer], [facility] 04/13/2023 10:56 AM

## 2023-04-13 NOTE — Addendum Note (Signed)
 Addended by: Moss Mc on: 04/13/2023 12:26 PM   Modules accepted: Orders

## 2023-05-07 DIAGNOSIS — J039 Acute tonsillitis, unspecified: Secondary | ICD-10-CM | POA: Diagnosis not present

## 2023-05-07 DIAGNOSIS — R070 Pain in throat: Secondary | ICD-10-CM | POA: Diagnosis not present

## 2023-05-11 ENCOUNTER — Ambulatory Visit: Payer: BC Managed Care – PPO | Admitting: Adult Health

## 2023-05-11 ENCOUNTER — Encounter: Payer: Self-pay | Admitting: Adult Health

## 2023-05-11 VITALS — BP 100/67 | HR 63 | Ht 65.5 in | Wt 180.0 lb

## 2023-05-11 DIAGNOSIS — Z30431 Encounter for routine checking of intrauterine contraceptive device: Secondary | ICD-10-CM | POA: Diagnosis not present

## 2023-05-11 NOTE — Progress Notes (Addendum)
  Subjective:     Patient ID: Donna Mcknight, female   DOB: 04-09-1996, 28 y.o.   MRN: [id number]  HPI Donna Mcknight is a 28 year old white female, married, G2P2002 in for IUD, she had ParaGard placed 04/13/23 at postpartum visit by Drenda Freeze.     Component Value Date/Time   DIAGPAP  08/17/2020 0929    - Negative for intraepithelial lesion or malignancy (NILM)   HPVHIGH Negative 08/17/2020 0929   ADEQPAP  08/17/2020 0929    Satisfactory for evaluation; transformation zone component PRESENT.    Review of Systems Can feel strings No problems with sex Reviewed past medical,surgical, social and family history. Reviewed medications and allergies.     Objective:   Physical Exam BP 100/67 (BP Location: Left Arm, Patient Position: Sitting, Cuff Size: Normal)   Pulse 63   Ht 5' 5.5" (1.664 m)   Wt 180 lb (81.6 kg)   Breastfeeding No   BMI 29.50 kg/m     Skin warm and dry.Pelvic: external genitalia is normal in appearance no lesions, vagina: pink,urethra has no lesions or masses noted, cervix:bulbous, everted at os, +IUD strings at os, uterus: normal size, shape and contour, non tender, no masses felt, adnexa: no masses or tenderness noted. Bladder is non tender and no masses felt. Fall risk is low  Upstream - 05/11/23 0923       Pregnancy Intention Screening   Does the patient want to become pregnant in the next year? No    Does the patient's partner want to become pregnant in the next year? No    Would the patient like to discuss contraceptive options today? No      Contraception Wrap Up   Current Method IUD or IUS    End Method IUD or IUS    Contraception Counseling Provided Yes            Examination chaperoned by Malachy Mood LPN  Assessment:     1. IUD check up (Primary) ParaGard placed 04/13/23     Plan:     Return about 09/07/23 for pap and physical or sooner if needed

## 2023-05-19 ENCOUNTER — Encounter: Payer: Self-pay | Admitting: Advanced Practice Midwife

## 2023-07-05 ENCOUNTER — Encounter: Payer: Self-pay | Admitting: Advanced Practice Midwife

## 2023-07-06 ENCOUNTER — Ambulatory Visit: Admitting: Advanced Practice Midwife

## 2023-07-06 ENCOUNTER — Ambulatory Visit

## 2023-07-06 ENCOUNTER — Encounter: Payer: Self-pay | Admitting: Advanced Practice Midwife

## 2023-07-06 ENCOUNTER — Other Ambulatory Visit: Payer: Self-pay | Admitting: Advanced Practice Midwife

## 2023-07-06 VITALS — BP 125/84 | HR 89 | Ht 65.5 in | Wt 167.0 lb

## 2023-07-06 DIAGNOSIS — T8332XA Displacement of intrauterine contraceptive device, initial encounter: Secondary | ICD-10-CM

## 2023-07-06 DIAGNOSIS — R197 Diarrhea, unspecified: Secondary | ICD-10-CM | POA: Diagnosis not present

## 2023-07-06 DIAGNOSIS — R1084 Generalized abdominal pain: Secondary | ICD-10-CM

## 2023-07-06 DIAGNOSIS — Z30431 Encounter for routine checking of intrauterine contraceptive device: Secondary | ICD-10-CM

## 2023-07-06 DIAGNOSIS — N912 Amenorrhea, unspecified: Secondary | ICD-10-CM | POA: Diagnosis not present

## 2023-07-06 DIAGNOSIS — Z30432 Encounter for removal of intrauterine contraceptive device: Secondary | ICD-10-CM | POA: Diagnosis not present

## 2023-07-06 NOTE — Patient Instructions (Addendum)
 [facility] [address] [place], [place] [address]. Directions  [phone].

## 2023-07-06 NOTE — Progress Notes (Addendum)
 PELVIC US TA/ TV: homogeneous retroverted uterus,WNL,EEC 15.9 mm,normal ovaries,ovaries appear mobile,IUD is located within the lower uterine segment extending into the myometrium,no free fluid

## 2023-07-06 NOTE — Progress Notes (Signed)
 [facility] Visit  Patient name: Donna Mcknight MRN [id number]  Date of birth: Jan 22, 1996  CC & HPI:  Donna Mcknight is a 29 y.o.  female presenting today for IUD check:  can't feel her strings and is having pelvic pain.  Strings were visible and palpable by pt at IUD check visit in January. She has been having chronic diarrhea and diffuse abdominal pain for about 2 months.  Not specific to lower pelvic area, but was trying to R/O possible sources, so checked for her strings a few days ago and couldn't feel them.  LMP 06/12/23, normal.  Has also been having some fatigue and nausea, UPT yesterday was negative.  Offered serum HGC, accepted.     Pertinent History Reviewed:  Medical & Surgical Hx:   Past Medical History:  Diagnosis Date   Anemia    as teenager   Anxiety    Breech presentation of fetus 02/15/2023   Depression    Gestational diabetes    first preg   History of postpartum hemorrhage, currently pregnant    History of postpartum hemorrhage, currently pregnant    Mental disorder    anxiety, depression   UTI (urinary tract infection)    Vaginal bleeding in pregnancy, third trimester 01/09/2023   Past Surgical History:  Procedure Laterality Date   MOUTH SURGERY     Family History  Problem Relation Age of Onset   Cancer Maternal Grandmother    Diabetes Maternal Grandfather    Cirrhosis Father    Hypertension Mother    Fibroids Mother     Current Outpatient Medications:    Multiple Vitamin (MULTIVITAMIN) tablet, Take 1 tablet by mouth daily., Disp: , Rfl:    PARAGARD INTRAUTERINE COPPER IU, by Intrauterine route., Disp: , Rfl:  Social History: Reviewed -  reports that she has never smoked. She has never used smokeless tobacco.  Review of Systems:   Constitutional: Negative for fever and chills Eyes: Negative for visual disturbances Respiratory: Negative for shortness of breath, dyspnea Cardiovascular: Negative for chest pain or  palpitations  Gastrointestinal: Negative for vomiting, diarrhea and constipation; no abdominal pain Genitourinary: Negative for dysuria and urgency, vaginal irritation or itching Musculoskeletal: Negative for back pain, joint pain, myalgias  Neurological: Negative for dizziness and headaches    Objective Findings:    Physical Examination: Vitals:   07/06/23 1446  BP: 125/84  Pulse: 89      PELVIC US TA/ TV: homogeneous retroverted uterus,WNL,EEC 15.9 mm,normal ovaries,ovaries appear mobile,IUD is located within the lower uterine segment extending into the myometrium,no free fluid         General appearance - well appearing, and in no distress Mental status - alert, oriented to person, place, and time Chest:  Normal respiratory effort Heart - normal rate and regular rhythm Abdomen:  Soft, nontender Pelvic: Strings visible; IUD grasped and easily removed.  Musculoskeletal:  Normal range of motion without pain Extremities:  No edema    No results found for this or any previous visit (from the past 24 hours).    Assessment & Plan:  A:   IUD removal d/t malposition:  Unsure if wants Nexplanon vs Mirena (less likely to slip down d/t heavy bleeding, as presumed cause of malpositioned Paragard).   P:   Orders Placed This Encounter  Procedures   Beta hCG quant (ref lab)   Ambulatory referral to Gastroenterology    Referral Priority:   Routine    Referral Type:   Consultation  Referral Reason:   Specialty Services Required    Number of Visits Requested:   1    If GI sx go away, will decline gastro referral  Return for next week for Nexplanon vs Mirena.  Jacklyn Shell CNM 07/06/2023 4:39 PM

## 2023-07-07 LAB — BETA HCG QUANT (REF LAB): hCG Quant: <1 m[IU]/mL

## 2023-07-10 ENCOUNTER — Other Ambulatory Visit: Payer: Self-pay | Admitting: Advanced Practice Midwife

## 2023-07-10 DIAGNOSIS — Z975 Presence of (intrauterine) contraceptive device: Secondary | ICD-10-CM

## 2023-07-10 NOTE — Progress Notes (Signed)
?   Copper toxicity from Paragard IUD (from PT:    I wanted to update you that my symptoms are already starting to go away. I thought you might be curious.   I've barely had any abdominal pain today, and the pain I've had has been mild. I also noticed recently (maybe within the last week to 2 weeks) that I was starting to feel off balance every time I walked, increasingly disoriented, and increasingly forgetful to the point where it was concerning. That was all getting worse each day up to yesterday where I was even forgetting where I put my keys all day long. All of those symptoms are already less intense. This may sound crazy and I told myself it was, but I researched copper toxicity about a month ago and now I'm actually thinking that may explain all of my symptoms. Maybe I'm sensitive to copper - I know I'm allergic to nickel and had a very severe reaction and even had to be hospitalized for almost a week when I got my ears pierced as a kid, which was how I found out - so maybe I'm also sensitive to copper... I hope I don't have to be concerned about any long-term neurological effects or damage given that the IUD was only in place for a little under 3 months, if that is what was happening.

## 2023-07-11 DIAGNOSIS — Z975 Presence of (intrauterine) contraceptive device: Secondary | ICD-10-CM | POA: Diagnosis not present

## 2023-07-13 ENCOUNTER — Encounter: Payer: Self-pay | Admitting: Advanced Practice Midwife

## 2023-07-13 ENCOUNTER — Ambulatory Visit: Admitting: Advanced Practice Midwife

## 2023-07-13 VITALS — BP 90/62 | HR 72 | Ht 65.2 in | Wt 153.0 lb

## 2023-07-13 DIAGNOSIS — Z30017 Encounter for initial prescription of implantable subdermal contraceptive: Secondary | ICD-10-CM

## 2023-07-13 DIAGNOSIS — Z3202 Encounter for pregnancy test, result negative: Secondary | ICD-10-CM | POA: Diagnosis not present

## 2023-07-13 LAB — POCT URINE PREGNANCY: Preg Test, Ur: NEGATIVE

## 2023-07-13 MED ORDER — ETONOGESTREL 68 MG ~~LOC~~ IMPL
68.0000 mg | DRUG_IMPLANT | Freq: Once | SUBCUTANEOUS | Status: AC
Start: 2023-07-13 — End: 2023-07-13
  Administered 2023-07-13: 68 mg via SUBCUTANEOUS

## 2023-07-13 NOTE — Progress Notes (Signed)
  HPI:  Donna Mcknight is a 28 y.o. year old Caucasian female here for Nexplanon insertion.  Her LMP was 07/12/23 , and her pregnancy test today was negative.  Risks/benefits/side effects of Nexplanon have been discussed and her questions have been answered.  Specifically, a failure rate of 04/998 has been reported, with an increased failure rate if pt takes St. John's Wort and/or antiseizure medicaitons.  Donna Mcknight is aware of the common side effect of irregular bleeding, which the incidence of decreases over time.   Past Medical History: Past Medical History:  Diagnosis Date   Anemia    as teenager   Anxiety    Breech presentation of fetus 02/15/2023   Depression    Gestational diabetes    first preg   History of postpartum hemorrhage, currently pregnant    History of postpartum hemorrhage, currently pregnant    Mental disorder    anxiety, depression   UTI (urinary tract infection)    Vaginal bleeding in pregnancy, third trimester 01/09/2023    Past Surgical History: Past Surgical History:  Procedure Laterality Date   MOUTH SURGERY      Family History: Family History  Problem Relation Age of Onset   Cancer Maternal Grandmother    Diabetes Maternal Grandfather    Cirrhosis Father    Hypertension Mother    Fibroids Mother     Social History: Social History   Tobacco Use   Smoking status: Never   Smokeless tobacco: Never   Tobacco comments:    Quit 2014  Vaping Use   Vaping status: Former  Substance Use Topics   Alcohol use: Not Currently    Alcohol/week: 10.0 standard drinks of alcohol    Types: 10 Glasses of wine per week    Comment: almost everyday   Drug use: Not Currently    Comment: prior to preg    Allergies:  Allergies  Allergen Reactions   Nickel     Other reaction(s): edema   Other Hives and Swelling    Nickel        Her left arm, approximatly 4 inches proximal from the elbow, was cleansed with alcohol and  anesthetized with 2cc of 2% Lidocaine.  The area was cleansed again and the Nexplanon was inserted without difficulty.  A pressure bandage was applied.  Pt was instructed to remove pressure bandage in a few hours, and keep insertion site covered with a bandaid for 3 days.  Back up contraception was recommended for n/a (on period).  Follow-up scheduled PRN problems  Donna Mcknight 07/13/2023 3:24 PM

## 2023-07-13 NOTE — Patient Instructions (Signed)
  Nexplanon   Contraceptive Implant Information A contraceptive implant is a plastic rod that is inserted under your skin. It is usually inserted under the skin of your upper arm. It continually releases small amounts of progestin (synthetic progesterone) into your bloodstream. This prevents an egg from being released from your ovaries. It also thickens your cervical mucus to prevent sperm from entering the cervix, and it thins your uterine lining to prevent a fertilized egg from attaching to your uterus. Contraceptive implants can be effective for up to 3 years. They do not provide protection against sexually transmitted diseases (STDs).  The procedure to insert an implant usually takes about 10 minutes. There may be minor bruising, swelling, and discomfort at the insertion site for a couple days. The implant begins to work within the first day. Other contraceptive protection may be necessary for 7 days. Be sure to discuss with your health care provider if you need a backup method of contraception.  Your health care provider will make sure you are a good candidate for the contraceptive implant. Discuss with your health care provider the possible side effects of the implant. ADVANTAGES It prevents pregnancy for up to 3 years. It is easily reversible. It is convenient. It can be used when breastfeeding. It can be used by women who cannot take estrogen. DISADVANTAGES You may have irregular or unplanned vaginal bleeding. You may develop side effects, including headache, weight gain, acne, breast tenderness, or mood changes. You may have tissue or nerve damage after insertion (rare). It may be difficult and uncomfortable to remove. Certain medicines may interfere with the effectiveness of the implant.  REMOVAL OF IMPLANT The implant should be removed in 3 years or as directed by your health care provider. The implant's effect wears off in a few hours after removal. Your ability to get pregnant  (fertility) may be restored in 1-2 weeks. A new implant can be inserted as soon as the old one is removed if desired. CONTRAINDICATIONS You should not get the implant if you are experiencing any of the following situations: You are pregnant. You have a history of breast cancer, osteoporosis, blood clots, heart disease, diabetes, high blood pressure, liver disease, tumors, or stroke.   You have undiagnosed vaginal bleeding. You have a sensitivity to any part of the implant. This information is not intended to replace advice given to you by your health care provider. Make sure you discuss any questions you have with your health care provider. Document Released: 03/17/2011 Document Revised: 11/28/2012 Document Reviewed: 09/24/2012 Elsevier Interactive Patient Education  2017 Elsevier Inc.    

## 2023-07-15 LAB — COPPER, SERUM: Copper: 111 ug/dL (ref 80–158)

## 2023-07-16 NOTE — Progress Notes (Unsigned)
 GI Office Note    Referring Provider: Wandra Mannan* Primary Care Physician:  Pcp, No  Primary Gastroenterologist: Hennie Duos. Marletta Lor, DO  Chief Complaint   Chief Complaint  Patient presents with   New Patient (Initial Visit)    Pt referred for diarrhea and generalized abd pain. Pt has had it since January. Pt has gone down to once to twice a day.   History of Present Illness   Donna Mcknight is a 28 y.o. female presenting today at the request of Cresenzo-Dishmon, Kenn File* (OBGYN) for abdominal pain and diarrhea.   Recently gave birth November 2024.   OV 07/06/23 with OBGYN - Reported 2 months of chronic diffuse abdominal pain and diarrhea. IUD removed due to malposition. Referred t GI - advised if GI symptoms resolved then will decline GI referral.   Today:  Diarrhea and abdominal pain present since January.  Currently going 1-2 times a day. Previously up to 5 times per day. A lot of abdominal pain has improved since IUD removed. Having lots of gas in her abdomen recently. Usually extremely constipated so this is usually for her. Mostly lower back and lower belly and very mild pain compared to prior. Currently with nexplanon.   Reports copper levels were within normal.   Has had nausea and headaches as well. Slowly improving.   No family history of gallbladder disease or pancreatic disae. No celiac or IBD family history. Noh x colon cancer or colon polyps.   Still taking womens health probiotic. Was taking magnesium right after birth more to help with postpartum depression. It is a magnesium and vitamin D combo.   Wt Readings from Last 3 Encounters:  07/17/23 167 lb 12.8 oz (76.1 kg)  07/13/23 153 lb (69.4 kg)  07/06/23 167 lb (75.8 kg)    Current Outpatient Medications  Medication Sig Dispense Refill   magnesium 30 MG tablet Take 420 mg by mouth in the morning and at bedtime.     Multiple Vitamin (MULTIVITAMIN) tablet Take 1 tablet by mouth daily.      Probiotic Product (FORTIFY 50 BILLION PROBIOT 50+ PO) Take by mouth.     saccharomyces boulardii (FLORASTOR) 250 MG capsule Take 250 mg by mouth 2 (two) times daily.     PARAGARD INTRAUTERINE COPPER IU by Intrauterine route.     No current facility-administered medications for this visit.    Past Medical History:  Diagnosis Date   Anemia    as teenager   Anxiety    Breech presentation of fetus 02/15/2023   Depression    Gestational diabetes    first preg   History of postpartum hemorrhage, currently pregnant    History of postpartum hemorrhage, currently pregnant    Mental disorder    anxiety, depression   UTI (urinary tract infection)    Vaginal bleeding in pregnancy, third trimester 01/09/2023    Past Surgical History:  Procedure Laterality Date   MOUTH SURGERY      Family History  Problem Relation Age of Onset   Cancer Maternal Grandmother    Diabetes Maternal Grandfather    Cirrhosis Father    Hypertension Mother    Fibroids Mother     Allergies as of 07/17/2023 - Review Complete 07/17/2023  Allergen Reaction Noted   Nickel  01/08/2022   Other Hives and Swelling 10/14/2020    Social History   Socioeconomic History   Marital status: Married    Spouse name: Not on file   Number of children: 1  Years of education: Not on file   Highest education level: Not on file  Occupational History   Not on file  Tobacco Use   Smoking status: Never   Smokeless tobacco: Never   Tobacco comments:    Quit 2014  Vaping Use   Vaping status: Former  Substance and Sexual Activity   Alcohol use: Not Currently    Alcohol/week: 10.0 standard drinks of alcohol    Types: 10 Glasses of wine per week    Comment: almost everyday   Drug use: Not Currently    Comment: prior to preg   Sexual activity: Yes  Other Topics Concern   Not on file  Social History Narrative   Not on file   Social Drivers of Health   Financial Resource Strain: Low Risk  (06/30/2022)   Overall  Financial Resource Strain (CARDIA)    Difficulty of Paying Living Expenses: Not hard at all  Food Insecurity: No Food Insecurity (03/08/2023)   Hunger Vital Sign    Worried About Running Out of Food in the Last Year: Never true    Ran Out of Food in the Last Year: Never true  Transportation Needs: No Transportation Needs (03/08/2023)   PRAPARE - Administrator, Civil Service (Medical): No    Lack of Transportation (Non-Medical): No  Physical Activity: Insufficiently Active (06/30/2022)   Exercise Vital Sign    Days of Exercise per Week: 1 day    Minutes of Exercise per Session: 10 min  Stress: Stress Concern Present (06/30/2022)   [employer] of Occupational Health - Occupational Stress Questionnaire    Feeling of Stress : To some extent  Social Connections: Socially Isolated (06/30/2022)   Social Connection and Isolation Panel [NHANES]    Frequency of Communication with Friends and Family: Once a week    Frequency of Social Gatherings with Friends and Family: Never    Attends Religious Services: Never    Database administrator or Organizations: No    Attends Banker Meetings: Never    Marital Status: Married  Catering manager Violence: Not At Risk (03/08/2023)   Humiliation, Afraid, Rape, and Kick questionnaire    Fear of Current or Ex-Partner: No    Emotionally Abused: No    Physically Abused: No    Sexually Abused: No     Review of Systems   Gen: Denies any fever, chills, fatigue, weight loss, lack of appetite.  CV: Denies chest pain, heart palpitations, peripheral edema, syncope.  Resp: Denies shortness of breath at rest or with exertion. Denies wheezing or cough.  GI: see HPI GU : Denies urinary burning, urinary frequency, urinary hesitancy MS: Denies joint pain, muscle weakness, cramps, or limitation of movement.  Derm: Denies rash, itching, dry skin Psych: Denies depression, anxiety, memory loss, and confusion Heme: Denies bruising,  bleeding, and enlarged lymph nodes.  Physical Exam   BP 104/69   Pulse 62   Temp 98.2 F (36.8 C)   Ht 5\' 5"  (1.651 m)   Wt 167 lb 12.8 oz (76.1 kg)   LMP 07/13/2023   BMI 27.92 kg/m   General:   Alert and oriented. Pleasant and cooperative. Well-nourished and well-developed.  Head:  Normocephalic and atraumatic. Eyes:  Without icterus, sclera clear and conjunctiva pink.  Ears:  Normal auditory acuity. Mouth:  No deformity or lesions, oral mucosa pink.  Abdomen:  +BS, soft, non-distended. Mild ttp to umbilical region. No HSM noted. No guarding or rebound. No masses appreciated.  Rectal:  Deferred  Msk:  Symmetrical without gross deformities. Normal posture. Neurologic:  Alert and  oriented x4;  grossly normal neurologically. Skin:  Intact without significant lesions or rashes. Psych:  Alert and cooperative. Normal mood and affect.  Assessment   Donna Mcknight is a 28 y.o. female with a history of anxiety, gestational diabetes, depression, and constipation presenting today with concern for abdominal pain and acute diarrhea.   Diarrhea, abdominal pain, bloating: - diarrhea and abdominal pain likely secondary to copper IUD, recently removed 2 weeks ago - symptoms have improved significantly in severity.  - Not likely infectious given chronicity of symptoms. - Symptoms were associated with nausea and headaches as well, all signs of possible higher levels of copper despite normal copper level on blood work. - having some bloating - usually constipated at baseline - Taking Magnesium and Vitamin D supplement combo currently as well as probiotic.  - Will do basic workup of diarrhea given suspicion of symptom etiology related to copper IUD.   PLAN   KUB Celiac, CBC, CMP, Mg Low fiber until diarrhea resolves then can resume high fiber  Continue probiotics and magnesium If symptoms continue we can pursue different stool studies and further imaging to assess biliary tract.   Follow up TBD after labs and imaging.   Brooke Bonito, MSN, FNP-BC, AGACNP-BC [facility]

## 2023-07-17 ENCOUNTER — Encounter: Payer: Self-pay | Admitting: Gastroenterology

## 2023-07-17 ENCOUNTER — Ambulatory Visit (HOSPITAL_COMMUNITY)
Admission: RE | Admit: 2023-07-17 | Discharge: 2023-07-17 | Disposition: A | Discharge status: Home / Self Care | Source: Ambulatory Visit | Attending: Gastroenterology | Admitting: Gastroenterology

## 2023-07-17 ENCOUNTER — Ambulatory Visit: Admitting: Gastroenterology

## 2023-07-17 VITALS — BP 104/69 | HR 62 | Temp 98.2°F | Ht 65.0 in | Wt 167.8 lb

## 2023-07-17 DIAGNOSIS — K5900 Constipation, unspecified: Secondary | ICD-10-CM

## 2023-07-17 DIAGNOSIS — R197 Diarrhea, unspecified: Secondary | ICD-10-CM | POA: Insufficient documentation

## 2023-07-17 DIAGNOSIS — R1033 Periumbilical pain: Secondary | ICD-10-CM

## 2023-07-17 DIAGNOSIS — R140 Abdominal distension (gaseous): Secondary | ICD-10-CM

## 2023-07-17 DIAGNOSIS — R110 Nausea: Secondary | ICD-10-CM

## 2023-07-17 DIAGNOSIS — R109 Unspecified abdominal pain: Secondary | ICD-10-CM | POA: Diagnosis not present

## 2023-07-17 DIAGNOSIS — R519 Headache, unspecified: Secondary | ICD-10-CM

## 2023-07-17 NOTE — Patient Instructions (Addendum)
 Continue probiotics.   Please have blood work completed at [facility].  We will call you with results once they have been received. Please allow 3-5 business days for review. 2 locations for [facility] in [place]:              1. [address], [place]              2. [address] [place]   I have ordered an abdominal x-ray for you to evaluate your bleeding as well as your diarrhea just to make sure this is not overflow diarrhea in the setting of constipation.  I suspect the severity of your symptoms were more so related to your copper IUD, it can take some time for symptoms to completely resolve.  For now while you are having diarrhea, I want you to follow a lower fiber diet for couple weeks and then you can resume a higher fiber diet.  For now you can continue all current medications.  If you begin to have a return of the diarrhea to 5-6 times per day, you may use Imodium as needed and contact the office.  Will let you know about follow-up if needed pending your abdominal x-ray and your lab results.  It was a pleasure to see you today! I want to create trusting relationships with patients. If you receive a survey regarding your visit,  I greatly appreciate you taking time to fill this out on paper or through your MyChart. I value your feedback.  Brooke Bonito, MSN, FNP-BC, AGACNP-BC [facility]

## 2023-07-18 ENCOUNTER — Encounter: Payer: Self-pay | Admitting: Gastroenterology

## 2023-07-18 ENCOUNTER — Encounter: Payer: Self-pay | Admitting: Advanced Practice Midwife

## 2023-07-20 ENCOUNTER — Encounter: Payer: Self-pay | Admitting: Advanced Practice Midwife

## 2023-07-20 LAB — CBC
Hematocrit: 40.3 % (ref 34.0–46.6)
Hemoglobin: 12.9 g/dL (ref 11.1–15.9)
MCH: 29.3 pg (ref 26.6–33.0)
MCHC: 32 g/dL (ref 31.5–35.7)
MCV: 92 fL (ref 79–97)
Platelets: 306 10*3/uL (ref 150–450)
RBC: 4.4 x10E6/uL (ref 3.77–5.28)
RDW: 12.7 % (ref 11.7–15.4)
WBC: 5.3 10*3/uL (ref 3.4–10.8)

## 2023-07-20 LAB — CELIAC DISEASE PANEL: IgA/Immunoglobulin A, Serum: 399 mg/dL — ABNORMAL HIGH (ref 87–352)

## 2023-07-20 LAB — COMPREHENSIVE METABOLIC PANEL WITH GFR
ALT: 29 IU/L (ref 0–32)
AST: 26 IU/L (ref 0–40)
Albumin: 4.8 g/dL (ref 4.0–5.0)
Alkaline Phosphatase: 96 IU/L (ref 44–121)
BUN/Creatinine Ratio: 10 (ref 9–23)
BUN: 9 mg/dL (ref 6–20)
Bilirubin Total: 0.3 mg/dL (ref 0.0–1.2)
CO2: 21 mmol/L (ref 20–29)
Calcium: 9.5 mg/dL (ref 8.7–10.2)
Chloride: 102 mmol/L (ref 96–106)
Creatinine, Ser: 0.86 mg/dL (ref 0.57–1.00)
Globulin, Total: 2.4 g/dL (ref 1.5–4.5)
Glucose: 85 mg/dL (ref 70–99)
Potassium: 5.3 mmol/L — ABNORMAL HIGH (ref 3.5–5.2)
Sodium: 139 mmol/L (ref 134–144)
Total Protein: 7.2 g/dL (ref 6.0–8.5)
eGFR: 94 mL/min/{1.73_m2} (ref >59)

## 2023-07-20 LAB — MAGNESIUM: Magnesium: 2.4 mg/dL — ABNORMAL HIGH (ref 1.6–2.3)

## 2023-07-24 DIAGNOSIS — K5900 Constipation, unspecified: Secondary | ICD-10-CM

## 2023-07-24 DIAGNOSIS — R197 Diarrhea, unspecified: Secondary | ICD-10-CM

## 2023-07-24 DIAGNOSIS — R1033 Periumbilical pain: Secondary | ICD-10-CM

## 2023-07-24 DIAGNOSIS — R140 Abdominal distension (gaseous): Secondary | ICD-10-CM

## 2023-07-24 DIAGNOSIS — J039 Acute tonsillitis, unspecified: Secondary | ICD-10-CM | POA: Diagnosis not present

## 2023-07-25 ENCOUNTER — Ambulatory Visit: Payer: Self-pay | Admitting: *Deleted

## 2023-07-25 ENCOUNTER — Ambulatory Visit: Admitting: Women's Health

## 2023-07-25 ENCOUNTER — Encounter: Payer: Self-pay | Admitting: Women's Health

## 2023-07-25 VITALS — BP 114/79 | HR 64 | Ht 65.2 in | Wt 164.0 lb

## 2023-07-25 DIAGNOSIS — Z3046 Encounter for surveillance of implantable subdermal contraceptive: Secondary | ICD-10-CM | POA: Diagnosis not present

## 2023-07-25 DIAGNOSIS — Z30011 Encounter for initial prescription of contraceptive pills: Secondary | ICD-10-CM

## 2023-07-25 MED ORDER — LEVONORGEST-ETH ESTRAD 91-DAY 0.1-0.02 & 0.01 MG PO TABS: 1.0000 | ORAL_TABLET | Freq: Every day | ORAL | 3 refills | Status: DC

## 2023-07-25 NOTE — Progress Notes (Addendum)
 NEXPLANON REMOVAL Patient name: Donna Mcknight MRN [id number]  Date of birth: [birth year] Subjective Findings:   Donna Mcknight is a 28 y.o. G11P2002 Caucasian female being seen today for removal of a Nexplanon. Her Nexplanon was placed 07/13/23.  She desires removal because it is causing worsening of depression. Declines need for meds/therapy, feels sx will improve w/ removal. Denies SI/HI/II. Did some research and would like to try LoSeasonique. Does not smoke, no h/o HTN, DVT/PE, CVA, MI, or migraines w/ aura.  Signed copy of informed consent in chart.   Patient's last menstrual period was 07/13/2023. Last pap 08/17/20. Results were: NILM w/ HRHPV negative The planned method of family planning is OCP (estrogen/progesterone)     07/25/2023    3:46 PM 08/25/2022    9:46 AM 06/30/2022    8:46 AM 08/23/2021    8:38 AM 06/21/2021    4:28 PM  Depression screen PHQ 2/9  Decreased Interest 1 0 1 0 1  Down, Depressed, Hopeless 2 0 1 0 1  PHQ - 2 Score 3 0 2 0 2  Altered sleeping 0 3 0 0 1  Tired, decreased energy 3 1 3 1 3   Change in appetite 2 3 2  0 0  Feeling bad or failure about yourself  1 0 1 0 1  Trouble concentrating 0 0 1 0 1  Moving slowly or fidgety/restless 0 0 0 0 0  Suicidal thoughts 0 0 1 0 0  PHQ-9 Score 9 7 10 1 8         07/25/2023    3:48 PM 08/25/2022    9:46 AM 06/30/2022    8:46 AM 08/23/2021    8:54 AM  GAD 7 : Generalized Anxiety Score  Nervous, Anxious, on Edge 2 2 1 1   Control/stop worrying 0 1 2 1   Worry too much - different things 1 1 1  0  Trouble relaxing 1 1 1 1   Restless 0 0 0 0  Easily annoyed or irritable 3 2 1 1   Afraid - awful might happen 1 2 0 1  Total GAD 7 Score 8 9 6 5      Pertinent History Reviewed:   Reviewed past medical,surgical, social, obstetrical and family history.  Reviewed problem list, medications and allergies. Objective Findings & Procedure:    Vitals:   07/25/23 1535  BP: 114/79  Pulse: 64  Weight: 164 lb  (74.4 kg)  Height: 5' 5.2" (1.656 m)  Body mass index is 27.12 kg/m.  No results found for this or any previous visit (from the past 24 hours).   Time out was performed.  Nexplanon site identified.  Area prepped in usual sterile fashon. One cc of 2% lidocaine was used to anesthetize the area at the distal end of the implant. A small stab incision was made right beside the implant on the distal portion.  The Nexplanon rod was grasped using hemostats and removed without difficulty.  There was less than 3 cc blood loss. There were no complications.  Steri-strips were applied over the small incision and a pressure bandage was applied.  The patient tolerated the procedure well. Assessment & Plan:   1) Nexplanon removal She was instructed to keep the area clean and dry, remove pressure bandage in 24 hours, and keep insertion site covered with the steri-strip for 3-5 days.   Follow-up PRN problems.  2) Contraception management> rx LoSeasonique per request, condoms x 2wks, f/u   No orders of the defined types were  placed in this encounter.   Follow-up: Return for pap & physical and med f/u (cancel May appt).  Ferd Householder CNM, [facility] 07/25/2023 4:11 PM

## 2023-07-25 NOTE — Telephone Encounter (Signed)
  Chief Complaint: depression- SE Nexplanon Symptoms: Patient states she has had prolonged troubles with hormonal birth control- she has appointment this afternoon to have Nexplanon removed.  Frequency: ongoing HRT SE Pertinent Negatives: Patient denies suicidal thoughts  Disposition: [] ED /[] Urgent Care (no appt availability in office) / [] Appointment(In office/virtual)/ []  [place] [facility]/ [] Home Care/ [] Refused Recommended Disposition /[]  Mobile Bus/ [x]  Follow-up with PCP Additional Notes: Patient has scheduled NP appointment- and has appointment with her GYN for her SE with Nexplanon.    Copied from CRM [id number]. Topic: Clinical - Red Word Triage >> Jul 25, 2023 10:25 AM Turkey B wrote: [facility] that prompted transfer to Nurse Triage: pt experiencing depression Reason for Disposition . Symptoms interfere with work or school  Answer Assessment - Initial Assessment Questions 1. CONCERN: "What happened that made you call today?"     Patient calling to make NP appointment- she has some depression- she thinks SE of birth control and has appointment today 2. DEPRESSION SYMPTOM SCREENING: "How are you feeling overall?" (e.g., decreased energy, increased sleeping or difficulty sleeping, difficulty concentrating, feelings of sadness, guilt, hopelessness, or worthlessness)     Nexplanon- causing SE- is having removed today 3. RISK OF HARM - SUICIDAL IDEATION:  "Do you ever have thoughts of hurting or killing yourself?"  (e.g., yes, no, no but preoccupation with thoughts about death)   - INTENT:  "Do you have thoughts of hurting or killing yourself right NOW?" (e.g., yes, no, N/A)   - PLAN: "Do you have a specific plan for how you would do this?" (e.g., gun, knife, overdose, no plan, N/A)     no 4. RISK OF HARM - HOMICIDAL IDEATION:  "Do you ever have thoughts of hurting or killing someone else?"  (e.g., yes, no, no but preoccupation with thoughts about death)   - INTENT:  "Do  you have thoughts of hurting or killing someone right NOW?" (e.g., yes, no, N/A)   - PLAN: "Do you have a specific plan for how you would do this?" (e.g., gun, knife, no plan, N/A)      no  Protocols used: Depression-A-AH

## 2023-08-02 ENCOUNTER — Ambulatory Visit: Admitting: Advanced Practice Midwife

## 2023-08-07 ENCOUNTER — Ambulatory Visit: Payer: Self-pay

## 2023-08-21 ENCOUNTER — Ambulatory Visit

## 2023-08-21 VITALS — BP 115/75 | HR 77 | Ht 65.0 in | Wt 165.1 lb

## 2023-08-21 DIAGNOSIS — L989 Disorder of the skin and subcutaneous tissue, unspecified: Secondary | ICD-10-CM | POA: Diagnosis not present

## 2023-08-21 DIAGNOSIS — R6889 Other general symptoms and signs: Secondary | ICD-10-CM | POA: Diagnosis not present

## 2023-08-21 NOTE — Progress Notes (Unsigned)
 New Patient Office Visit  Subjective    Patient ID: Donna Mcknight, female    DOB: [birth year]  Age: 28 y.o. MRN: [id number]  CC:  Chief Complaint  Patient presents with   Establish Care    Pt here to establish care     HPI Donna Mcknight presents to establish care Breathing issues through nose, since childhood.  She denies any previous facial/nasal injuries.   2. Bumps on face  Outpatient Encounter Medications as of 08/21/2023  Medication Sig   Levonorgestrel-Ethinyl Estradiol (AMETHIA) 0.1-0.02 & 0.01 MG tablet Take 1 tablet by mouth daily.   Multiple Vitamin (MULTIVITAMIN) tablet Take 1 tablet by mouth daily.   Probiotic Product (FORTIFY 50 BILLION PROBIOT 50+ PO) Take by mouth.   magnesium  30 MG tablet Take 420 mg by mouth in the morning and at bedtime.   saccharomyces boulardii (FLORASTOR) 250 MG capsule Take 250 mg by mouth 2 (two) times daily.   No facility-administered encounter medications on file as of 08/21/2023.    Past Medical History:  Diagnosis Date   Anemia    as teenager   Anxiety    Breech presentation of fetus 02/15/2023   Depression    Gestational diabetes    first preg   History of postpartum hemorrhage, currently pregnant    History of postpartum hemorrhage, currently pregnant    Mental disorder    anxiety, depression   UTI (urinary tract infection)    Vaginal bleeding in pregnancy, third trimester 01/09/2023    Past Surgical History:  Procedure Laterality Date   MOUTH SURGERY      Family History  Problem Relation Age of Onset   Cancer Maternal Grandmother    Diabetes Maternal Grandfather    Cirrhosis Father    Hypertension Mother    Fibroids Mother     Social History   Socioeconomic History   Marital status: Married    Spouse name: Not on file   Number of children: 1   Years of education: Not on file   Highest education level: Professional school degree (e.g., MD, DDS, DVM, JD)  Occupational History   Not  on file  Tobacco Use   Smoking status: Never   Smokeless tobacco: Never   Tobacco comments:    Quit 2014  Vaping Use   Vaping status: Former  Substance and Sexual Activity   Alcohol use: Not Currently    Alcohol/week: 10.0 standard drinks of alcohol    Types: 10 Glasses of wine per week    Comment: almost everyday   Drug use: Not Currently    Comment: prior to preg   Sexual activity: Yes    Birth control/protection: Implant  Other Topics Concern   Not on file  Social History Narrative   Not on file   Social Drivers of Health   Financial Resource Strain: Low Risk  (08/17/2023)   Overall Financial Resource Strain (CARDIA)    Difficulty of Paying Living Expenses: Not hard at all  Food Insecurity: No Food Insecurity (08/17/2023)   Hunger Vital Sign    Worried About Running Out of Food in the Last Year: Never true    Ran Out of Food in the Last Year: Never true  Transportation Needs: No Transportation Needs (08/17/2023)   PRAPARE - Administrator, Civil Service (Medical): No    Lack of Transportation (Non-Medical): No  Physical Activity: Insufficiently Active (08/17/2023)   Exercise Vital Sign    Days of Exercise per Week:  2 days    Minutes of Exercise per Session: 20 min  Stress: No Stress Concern Present (08/17/2023)   [employer] of Occupational Health - Occupational Stress Questionnaire    Feeling of Stress : Only a little  Social Connections: Socially Isolated (08/17/2023)   Social Connection and Isolation Panel [NHANES]    Frequency of Communication with Friends and Family: Once a week    Frequency of Social Gatherings with Friends and Family: Never    Attends Religious Services: Never    Database administrator or Organizations: No    Attends Engineer, structural: Not on file    Marital Status: Married  Catering manager Violence: Not At Risk (03/08/2023)   Humiliation, Afraid, Rape, and Kick questionnaire    Fear of Current or Ex-Partner: No     Emotionally Abused: No    Physically Abused: No    Sexually Abused: No    ROS      Objective    BP 115/75   Pulse 77   Ht 5\' 5"  (1.651 m)   Wt 165 lb 1.9 oz (74.9 kg)   SpO2 98%   BMI 27.48 kg/m   Physical Exam  {Labs (Optional):23779}    Assessment & Plan:   Problem List Items Addressed This Visit   None   No follow-ups on file.   Alison Irvine, FNP

## 2023-08-29 MED ORDER — PEG 3350-KCL-NA BICARB-NACL 420 G PO SOLR
4000.0000 mL | Freq: Once | ORAL | 0 refills | Status: AC
Start: 2023-08-29 — End: 2023-08-29

## 2023-08-31 NOTE — Telephone Encounter (Signed)
 Message sent to endo to add

## 2023-09-08 ENCOUNTER — Ambulatory Visit: Payer: BC Managed Care – PPO | Admitting: Adult Health

## 2023-09-11 ENCOUNTER — Encounter: Payer: Self-pay | Admitting: Advanced Practice Midwife

## 2023-09-12 ENCOUNTER — Ambulatory Visit: Payer: Self-pay

## 2023-09-12 NOTE — Telephone Encounter (Signed)
 Chief Complaint: back pain Symptoms: back pain, abdominal pain, vaginal bleeding Frequency: back pain since January Pertinent Negatives: Patient denies fever, urinary symptoms, N/V/D, urinary/bowel incontinence, dizziness Disposition: [] ED /[] Urgent Care (no appt availability in office) / [x] Appointment(In office/virtual)/ []  [place] [facility]/ [] Home Care/ [] Refused Recommended Disposition /[] [place] [facility]/ []  Follow-up with PCP Additional Notes: Pt reports lower back pain above her tailbone since January. Pt reports pain is worse when she is still and feels better when she is moving or walking. Tylenol  dulls the pain. Pt denies fever or urinary symptoms. Pt report pain that radiates to her thighs, but never both at the same time. Pt endorses intermittent tingling in her feet. Pt also reports 2-3/10 abdominal pain after giving birth in November. Pt has a colonoscopy scheduled for that as well as changes to her bowel movements. Pt denies N/V/D. Pt also reports ongoing vaginal bleeding for 14 days. Pt denies that it is severe, but it doesn't feel like a normal menstrual cycle. Pt reports starting hormonal birth control 1 month ago. RN scheduled pt for tomorrow. RN advised the pt if she worsens to go to the ED. Pt verbalized understanding.    Copied from CRM [id number]. Topic: Clinical - Red Word Triage >> Sep 12, 2023  8:37 AM Crispin Dolphin wrote: Red Word that prompted transfer to Nurse Triage: Really bad worsening back pain - hard to sit and work. Reason for Disposition  [1] MODERATE back pain (e.g., interferes with normal activities) AND [2] present > 3 days  Answer Assessment - Initial Assessment Questions 1. ONSET: "When did the pain begin?"      January  2. LOCATION: "Where does it hurt?" (upper, mid or lower back)     Lower back, above tailbone  3. SEVERITY: "How bad is the pain?"  (e.g., Scale 1-10; mild, moderate, or severe)   - MILD (1-3): Doesn't interfere with normal  activities.    - MODERATE (4-7): Interferes with normal activities or awakens from sleep.    - SEVERE (8-10): Excruciating pain, unable to do any normal activities.      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 PATTERN: "Is the pain constant?" (e.g., yes, no; constant, intermittent)      yes 5. RADIATION: "Does the pain shoot into your legs or somewhere else?"     Thighs, not at the same time 6. CAUSE:  "What do you think is causing the back pain?"      Not sure  7. BACK OVERUSE:  "Any recent lifting of heavy objects, strenuous work or exercise?"     No  8. MEDICINES: "What have you taken so far for the pain?" (e.g., nothing, acetaminophen , NSAIDS)     Only has taken ibuprofen , works a little  9. NEUROLOGIC SYMPTOMS: "Do you have any weakness, numbness, or problems with bowel/bladder control?"     Tingling in her feet, "sometimes" has numbness/weakness in her legs 10. OTHER SYMPTOMS: "Do you have any other symptoms?" (e.g., fever, abdomen pain, burning with urination, blood in urine)       Back pain when she is still, radiates to her thighs, tingling in the feet. Changes in bowel habits, coloscopy scheduled next month. Endorses abdominal pain (generalized) - 2-3/10. Denies fever. Denies N/V/D  Pt had a copper  IUD, then started low dose hormonal birth control 1 month ago. Pt states she has been bleeding for  14 days. Pt states it is unusual, does not feel like a normal period. States it is not extra heavy, just consistent. Denies weakness/dizziness but endorses fatigue. Has had issues with low iron    Denies dysuria, frequency, hematuria  Protocols used: Back Pain-A-AH

## 2023-09-12 NOTE — Telephone Encounter (Signed)
 Patient scheduled.

## 2023-09-13 ENCOUNTER — Ambulatory Visit

## 2023-09-13 VITALS — BP 109/74 | HR 69 | Ht 65.0 in | Wt 165.0 lb

## 2023-09-13 DIAGNOSIS — G8929 Other chronic pain: Secondary | ICD-10-CM

## 2023-09-13 DIAGNOSIS — M545 Low back pain, unspecified: Secondary | ICD-10-CM | POA: Diagnosis not present

## 2023-09-13 MED ORDER — METHOCARBAMOL 750 MG PO TABS: 750.0000 mg | ORAL_TABLET | Freq: Three times a day (TID) | ORAL | 3 refills | Status: DC | PRN

## 2023-09-13 NOTE — Progress Notes (Unsigned)
   Established Patient Office Visit  Subjective   Patient ID: Donna Mcknight, female    DOB: 05/10/1995  Age: 28 y.o. MRN: [id number]  Chief Complaint  Patient presents with   Medical Management of Chronic Issues    Pt here states 'Back pain for a couple months been real bad this past week though"    HPI Back Pain: Patient presents for presents evaluation of low back problems.  Symptoms have been present for {0-10:33138} {time units:11} and include {back symptoms:16447}. Initial inciting event: {none:33079}. Symptoms are worst: {time of day:16448}. Alleviating factors identifiable by patient are {back exacerbating/alleviating factors:16449}. Exacerbating factors identifiable by patient are {back exacerbating/alleviating factors:16449}. Treatments so far initiated by patient: {none:33079} Previous lower back problems: {none:33079}. Previous workup: {none:33079}. Previous treatments: {none:33079}.  Past Medical History:  Diagnosis Date   Anemia    as teenager   Anxiety    Breech presentation of fetus 02/15/2023   Depression    Gestational diabetes    first preg   History of postpartum hemorrhage, currently pregnant    History of postpartum hemorrhage, currently pregnant    Mental disorder    anxiety, depression   UTI (urinary tract infection)    Vaginal bleeding in pregnancy, third trimester 01/09/2023   Past Surgical History:  Procedure Laterality Date   MOUTH SURGERY        ROS    Objective:     BP 109/74   Pulse 69   Ht 5\' 5"  (1.651 m)   Wt 165 lb (74.8 kg)   SpO2 99%   BMI 27.46 kg/m  BP Readings from Last 3 Encounters:  09/13/23 109/74  08/21/23 115/75  07/25/23 114/79   Wt Readings from Last 3 Encounters:  09/13/23 165 lb (74.8 kg)  08/21/23 165 lb 1.9 oz (74.9 kg)  07/25/23 164 lb (74.4 kg)      Physical Exam   No results found for any visits on 09/13/23.  {Labs (Optional):23779}  The ASCVD Risk score (Arnett DK, et al., 2019) failed  to calculate for the following reasons:   The 2019 ASCVD risk score is only valid for ages 91 to 78    Assessment & Plan:   Problem List Items Addressed This Visit   None   No follow-ups on file.    Alison Irvine, FNP

## 2023-09-14 ENCOUNTER — Telehealth: Payer: Self-pay | Admitting: Women's Health

## 2023-09-14 DIAGNOSIS — M545 Low back pain, unspecified: Secondary | ICD-10-CM | POA: Insufficient documentation

## 2023-09-14 NOTE — Assessment & Plan Note (Signed)
 Recent KUB showed degenerative changes in lumbar spine.  Will obtain MRI for further evaluation given duration of symptoms.  Recommend home stretching exercises or gentle yoga to help with back pain.  Continue with NSAIDs as directed at mealtime for pain relief.  Robaxin added as well.  Follow-up according to MRI results.

## 2023-09-14 NOTE — Telephone Encounter (Signed)
 I've been taking the Camrese Lo for a little over 1 month now (I started the second tray of my first 55-month pack last weekend), and I wasn't having any issues at first but now I'm having a really irregular period. I know that's a normal side effect, but it seems a little extreme. I had a normal period right after I started the pack at the end of April, and then 12 days of no bleeding, and then I started another period and I'm currently on day 10. I know that's not incredibly long, but there are no signs of slowing down and I've been having really severe pain along with it, in all areas of my lower abdomen including my back. I'm having a colonoscopy and endoscopy at the end of this month to rule out anything serious in the GI tract because of the pain I've been having for the last 4-6 months, but I'm wondering now with this extended bleeding whether I should be concerned about potential gynecological issues as well.    Rondell Code

## 2023-09-19 ENCOUNTER — Other Ambulatory Visit (HOSPITAL_COMMUNITY)

## 2023-09-20 ENCOUNTER — Ambulatory Visit (HOSPITAL_COMMUNITY)
Admission: RE | Admit: 2023-09-20 | Discharge: 2023-09-20 | Disposition: A | Discharge status: Home / Self Care | Source: Ambulatory Visit

## 2023-09-20 DIAGNOSIS — G8929 Other chronic pain: Secondary | ICD-10-CM | POA: Insufficient documentation

## 2023-09-20 DIAGNOSIS — M545 Low back pain, unspecified: Secondary | ICD-10-CM | POA: Insufficient documentation

## 2023-10-04 ENCOUNTER — Other Ambulatory Visit (HOSPITAL_COMMUNITY)
Admission: RE | Admit: 2023-10-04 | Discharge: 2023-10-04 | Disposition: A | Discharge status: Home / Self Care | Source: Ambulatory Visit | Attending: Internal Medicine | Admitting: Internal Medicine

## 2023-10-04 DIAGNOSIS — K633 Ulcer of intestine: Secondary | ICD-10-CM | POA: Diagnosis not present

## 2023-10-04 DIAGNOSIS — R1084 Generalized abdominal pain: Secondary | ICD-10-CM | POA: Diagnosis not present

## 2023-10-04 DIAGNOSIS — R1033 Periumbilical pain: Secondary | ICD-10-CM | POA: Insufficient documentation

## 2023-10-04 DIAGNOSIS — K529 Noninfective gastroenteritis and colitis, unspecified: Secondary | ICD-10-CM | POA: Diagnosis not present

## 2023-10-04 DIAGNOSIS — K5900 Constipation, unspecified: Secondary | ICD-10-CM | POA: Insufficient documentation

## 2023-10-04 DIAGNOSIS — K2970 Gastritis, unspecified, without bleeding: Secondary | ICD-10-CM | POA: Diagnosis not present

## 2023-10-04 DIAGNOSIS — R140 Abdominal distension (gaseous): Secondary | ICD-10-CM | POA: Insufficient documentation

## 2023-10-04 DIAGNOSIS — Z87891 Personal history of nicotine dependence: Secondary | ICD-10-CM | POA: Diagnosis not present

## 2023-10-04 DIAGNOSIS — R197 Diarrhea, unspecified: Secondary | ICD-10-CM | POA: Insufficient documentation

## 2023-10-04 LAB — PREGNANCY, URINE: Preg Test, Ur: NEGATIVE

## 2023-10-06 ENCOUNTER — Encounter (HOSPITAL_COMMUNITY): Payer: Self-pay | Admitting: Internal Medicine

## 2023-10-06 ENCOUNTER — Ambulatory Visit (HOSPITAL_COMMUNITY): Payer: Self-pay | Admitting: Anesthesiology

## 2023-10-06 ENCOUNTER — Ambulatory Visit (HOSPITAL_COMMUNITY)
Admission: RE | Admit: 2023-10-06 | Discharge: 2023-10-06 | Disposition: A | Discharge status: Home / Self Care | Attending: Internal Medicine | Admitting: Internal Medicine

## 2023-10-06 ENCOUNTER — Encounter (HOSPITAL_COMMUNITY)
Admission: RE | Disposition: A | Discharge status: Home / Self Care | Payer: Self-pay | Source: Home / Self Care | Attending: Internal Medicine

## 2023-10-06 ENCOUNTER — Other Ambulatory Visit: Payer: Self-pay

## 2023-10-06 DIAGNOSIS — K633 Ulcer of intestine: Secondary | ICD-10-CM | POA: Insufficient documentation

## 2023-10-06 DIAGNOSIS — R140 Abdominal distension (gaseous): Secondary | ICD-10-CM

## 2023-10-06 DIAGNOSIS — K5900 Constipation, unspecified: Secondary | ICD-10-CM | POA: Diagnosis not present

## 2023-10-06 DIAGNOSIS — R194 Change in bowel habit: Secondary | ICD-10-CM | POA: Diagnosis not present

## 2023-10-06 DIAGNOSIS — K529 Noninfective gastroenteritis and colitis, unspecified: Secondary | ICD-10-CM | POA: Diagnosis not present

## 2023-10-06 DIAGNOSIS — Z87891 Personal history of nicotine dependence: Secondary | ICD-10-CM | POA: Insufficient documentation

## 2023-10-06 DIAGNOSIS — K2970 Gastritis, unspecified, without bleeding: Secondary | ICD-10-CM | POA: Insufficient documentation

## 2023-10-06 DIAGNOSIS — R197 Diarrhea, unspecified: Secondary | ICD-10-CM | POA: Diagnosis not present

## 2023-10-06 DIAGNOSIS — R1084 Generalized abdominal pain: Secondary | ICD-10-CM | POA: Insufficient documentation

## 2023-10-06 DIAGNOSIS — E119 Type 2 diabetes mellitus without complications: Secondary | ICD-10-CM | POA: Diagnosis not present

## 2023-10-06 DIAGNOSIS — R109 Unspecified abdominal pain: Secondary | ICD-10-CM | POA: Diagnosis not present

## 2023-10-06 HISTORY — PX: ESOPHAGOGASTRODUODENOSCOPY: SHX5428

## 2023-10-06 HISTORY — PX: COLONOSCOPY: SHX5424

## 2023-10-06 SURGERY — COLONOSCOPY
Anesthesia: General

## 2023-10-06 MED ORDER — LACTATED RINGERS IV SOLN: INTRAVENOUS | Status: DC

## 2023-10-06 MED ORDER — PROPOFOL 10 MG/ML IV BOLUS
INTRAVENOUS | Status: DC | PRN
Start: 2023-10-06 — End: 2023-10-06
  Administered 2023-10-06: 80 mg via INTRAVENOUS
  Administered 2023-10-06: 50 mg via INTRAVENOUS
  Administered 2023-10-06: 150 ug/kg/min via INTRAVENOUS

## 2023-10-06 MED ORDER — LIDOCAINE 2% (20 MG/ML) 5 ML SYRINGE
INTRAMUSCULAR | Status: DC | PRN
Start: 2023-10-06 — End: 2023-10-06
  Administered 2023-10-06: 60 mg via INTRAVENOUS

## 2023-10-06 MED ORDER — DEXMEDETOMIDINE HCL IN NACL 80 MCG/20ML IV SOLN
INTRAVENOUS | Status: DC | PRN
  Administered 2023-10-06: 8 ug via INTRAVENOUS

## 2023-10-06 NOTE — Op Note (Signed)
 [facility] Patient Name: Donna Mcknight Procedure Date: 10/06/2023 8:57 AM MRN: [id number] Date of Birth: 1996-04-10 Attending MD: Carlin POUR. Cindie , [place], [id number] CSN: [id number] Age: 28 Admit Type: Outpatient Procedure:                Colonoscopy Indications:              Generalized abdominal pain, Constipation, Diarrhea Providers:                Carlin POUR. Cindie, DO, Rosina Sprague, Emilee Tubb                            RN, RN, Daphne Mulch Technician, Technician Referring MD:              Medicines:                See the Anesthesia note for documentation of the                            administered medications Complications:            No immediate complications. Estimated Blood Loss:     Estimated blood loss was minimal. Procedure:                Pre-Anesthesia Assessment:                           - The anesthesia plan was to use monitored                            anesthesia care (MAC).                           After obtaining informed consent, the colonoscope                            was passed under direct vision. Throughout the                            procedure, the patient's blood pressure, pulse, and                            oxygen saturations were monitored continuously. The                            [id number]-[id number] ([id number]) scope was introduced through                            the anus and advanced to the the terminal ileum,                            with identification of the appendiceal orifice and                            IC valve. The colonoscopy was performed without  difficulty. The patient tolerated the procedure                            well. The quality of the bowel preparation was                            evaluated using the BBPS [facility] Bowel Preparation                            Scale) with scores of: Right Colon = 3, Transverse                            Colon = 3 and Left Colon = 3 (entire mucosa  seen                            well with no residual staining, small fragments of                            stool or opaque liquid). The total BBPS score                            equals 9. Scope In: 9:28:14 AM Scope Out: 9:44:03 AM Scope Withdrawal Time: 0 hours 9 minutes 51 seconds  Total Procedure Duration: 0 hours 15 minutes 49 seconds  Findings:      The terminal ileum contained multiple small 2-4 mm ulcers. No bleeding       was present. No stigmata of recent bleeding were seen. Biopsies were       taken with a cold forceps for histology.      The colon (entire examined portion) appeared normal. Impression:               - Multiple ulcers in the terminal ileum. Biopsied.                           - The entire examined colon is normal. Moderate Sedation:      Per Anesthesia Care Recommendation:           - Patient has a contact number available for                            emergencies. The signs and symptoms of potential                            delayed complications were discussed with the                            patient. Return to normal activities tomorrow.                            Written discharge instructions were provided to the                            patient.                           -  Resume previous diet.                           - Continue present medications.                           - Await pathology results.                           - Repeat colonoscopy for surveillance based on                            pathology results.                           - Return to GI clinic in 6 weeks. Procedure Code(s):        --- Professional ---                           [phone], Colonoscopy, flexible; with biopsy, single                            or multiple Diagnosis Code(s):        --- Professional ---                           K63.3, Ulcer of intestine                           R10.84, Generalized abdominal pain                           K59.00,  Constipation, unspecified                           R19.7, Diarrhea, unspecified CPT copyright 2022 American Medical Association. All rights reserved. The codes documented in this report are preliminary and upon coder review may  be revised to meet current compliance requirements. Carlin POUR. Cindie, DO Carlin POUR. Cindie, DO 10/06/2023 9:50:42 AM This report has been signed electronically. Number of Addenda: 0

## 2023-10-06 NOTE — Anesthesia Postprocedure Evaluation (Signed)
 Anesthesia Post Note  Patient: Azha C Durand-Johnson  Procedure(s) Performed: COLONOSCOPY EGD (ESOPHAGOGASTRODUODENOSCOPY)  Patient location during evaluation: Phase II Anesthesia Type: General Level of consciousness: awake Pain management: pain level controlled Vital Signs Assessment: post-procedure vital signs reviewed and stable Respiratory status: spontaneous breathing and respiratory function stable Cardiovascular status: blood pressure returned to baseline and stable Postop Assessment: no headache and no apparent nausea or vomiting Anesthetic complications: no Comments: Late entry   No notable events documented.   Last Vitals:  Vitals:   10/06/23 0802 10/06/23 0948  BP: 114/71 100/63  Pulse: 66 77  Resp: 18 20  Temp: 36.6 C 36.5 C  SpO2: 98% 98%    Last Pain:  Vitals:   10/06/23 0948  TempSrc: Oral  PainSc: 0-No pain                 Yvonna JINNY Bosworth

## 2023-10-06 NOTE — Discharge Instructions (Addendum)
 EGD Discharge instructions Please read the instructions outlined below and refer to this sheet in the next few weeks. These discharge instructions provide you with general information on caring for yourself after you leave the hospital. Your doctor may also give you specific instructions. While your treatment has been planned according to the most current medical practices available, unavoidable complications occasionally occur. If you have any problems or questions after discharge, please call your doctor. ACTIVITY You may resume your regular activity but move at a slower pace for the next 24 hours.  Take frequent rest periods for the next 24 hours.  Walking will help expel (get rid of) the air and reduce the bloated feeling in your abdomen.  No driving for 24 hours (because of the anesthesia (medicine) used during the test).  You may shower.  Do not sign any important legal documents or operate any machinery for 24 hours (because of the anesthesia used during the test).  NUTRITION Drink plenty of fluids.  You may resume your normal diet.  Begin with a light meal and progress to your normal diet.  Avoid alcoholic beverages for 24 hours or as instructed by your caregiver.  MEDICATIONS You may resume your normal medications unless your caregiver tells you otherwise.  WHAT YOU CAN EXPECT TODAY You may experience abdominal discomfort such as a feeling of fullness or "gas" pains.  FOLLOW-UP Your doctor will discuss the results of your test with you.  SEEK IMMEDIATE MEDICAL ATTENTION IF ANY OF THE FOLLOWING OCCUR: Excessive nausea (feeling sick to your stomach) and/or vomiting.  Severe abdominal pain and distention (swelling).  Trouble swallowing.  Temperature over 101 F (37.8 C).  Rectal bleeding or vomiting of blood.     Colonoscopy Discharge Instructions  Read the instructions outlined below and refer to this sheet in the next few weeks. These discharge instructions provide you with  general information on caring for yourself after you leave the hospital. Your doctor may also give you specific instructions. While your treatment has been planned according to the most current medical practices available, unavoidable complications occasionally occur.   ACTIVITY You may resume your regular activity, but move at a slower pace for the next 24 hours.  Take frequent rest periods for the next 24 hours.  Walking will help get rid of the air and reduce the bloated feeling in your belly (abdomen).  No driving for 24 hours (because of the medicine (anesthesia) used during the test).   Do not sign any important legal documents or operate any machinery for 24 hours (because of the anesthesia used during the test).  NUTRITION Drink plenty of fluids.  You may resume your normal diet as instructed by your doctor.  Begin with a light meal and progress to your normal diet. Heavy or fried foods are harder to digest and may make you feel sick to your stomach (nauseated).  Avoid alcoholic beverages for 24 hours or as instructed.  MEDICATIONS You may resume your normal medications unless your doctor tells you otherwise.  WHAT YOU CAN EXPECT TODAY Some feelings of bloating in the abdomen.  Passage of more gas than usual.  Spotting of blood in your stool or on the toilet paper.  IF YOU HAD POLYPS REMOVED DURING THE COLONOSCOPY: No aspirin  products for 7 days or as instructed.  No alcohol for 7 days or as instructed.  Eat a soft diet for the next 24 hours.  FINDING OUT THE RESULTS OF YOUR TEST Not all test results are  available during your visit. If your test results are not back during the visit, make an appointment with your caregiver to find out the results. Do not assume everything is normal if you have not heard from your caregiver or the medical facility. It is important for you to follow up on all of your test results.  SEEK IMMEDIATE MEDICAL ATTENTION IF: You have more than a spotting of  blood in your stool.  Your belly is swollen (abdominal distention).  You are nauseated or vomiting.  You have a temperature over 101.  You have abdominal pain or discomfort that is severe or gets worse throughout the day.   Your EGD revealed mild amount inflammation in your stomach.  I took biopsies of this to rule out infection with a bacteria called H. pylori.  I also took samples of your duodenum to check for celiac disease.  Esophagus appeared normal.  Your colonoscopy revealed multiple small ulcers in the end portion of your small bowel called the terminal ileum.  These are usually caused by NSAID use such as ibuprofen , though I took samples of this area to rule out underlying inflammatory bowel disease.  Your entire colon appeared normal.  No polyps, no evidence of colon cancer.  Await pathology results, my office will contact you.  Follow-up in GI office in 6 to 8 weeks. Message sent to the office and will schedule this.    I hope you have a great rest of your week!  Donna Mcknight. Cindie, D.O. Gastroenterology and Hepatology [facility]

## 2023-10-06 NOTE — Anesthesia Procedure Notes (Signed)
 Date/Time: 10/06/2023 9:17 AM  Performed by: Para Jerelene CROME, CRNAOxygen Delivery Method: Simple face mask Comments: POM Face Mask.

## 2023-10-06 NOTE — Transfer of Care (Addendum)
 Immediate Anesthesia Transfer of Care Note  Patient: Donna Mcknight  Procedure(s) Performed: COLONOSCOPY EGD (ESOPHAGOGASTRODUODENOSCOPY)  Patient Location: Endoscopy Unit  Anesthesia Type:General  Level of Consciousness: drowsy and patient cooperative  Airway & Oxygen Therapy: Patient Spontanous Breathing  Post-op Assessment: Report given to RN and Post -op Vital signs reviewed and stable  Post vital signs: Reviewed and stable  Last Vitals:  Vitals Value Taken Time  BP 100/63 10/06/23   0948  Temp 36.5 10/06/23   0948  Pulse 77 10/06/23   0948  Resp 20 10/06/23   0948  SpO2 98% 10/06/23   0948    Last Pain:  Vitals:   10/06/23 0914  TempSrc:   PainSc: 2       Patients Stated Pain Goal: 2 (10/06/23 0802)  Complications: No notable events documented.

## 2023-10-06 NOTE — Anesthesia Preprocedure Evaluation (Signed)
 Anesthesia Evaluation  Patient identified by MRN, date of birth, ID band Patient awake    Reviewed: Allergy & Precautions, H&P , NPO status , Patient's Chart, lab work & pertinent test results, reviewed documented beta blocker date and time   Airway Mallampati: II  TM Distance: >3 FB Neck ROM: full    Dental no notable dental hx.    Pulmonary neg pulmonary ROS   Pulmonary exam normal breath sounds clear to auscultation       Cardiovascular Exercise Tolerance: Good hypertension, negative cardio ROS  Rhythm:regular Rate:Normal     Neuro/Psych  PSYCHIATRIC DISORDERS Anxiety Depression    negative neurological ROS  negative psych ROS   GI/Hepatic negative GI ROS, Neg liver ROS,,,  Endo/Other  negative endocrine ROSdiabetes    Renal/GU negative Renal ROS  negative genitourinary   Musculoskeletal   Abdominal   Peds  Hematology negative hematology ROS (+) Blood dyscrasia, anemia   Anesthesia Other Findings   Reproductive/Obstetrics negative OB ROS                             Anesthesia Physical Anesthesia Plan  ASA: 2  Anesthesia Plan: General   Post-op Pain Management:    Induction:   PONV Risk Score and Plan: Propofol infusion  Airway Management Planned:   Additional Equipment:   Intra-op Plan:   Post-operative Plan:   Informed Consent: I have reviewed the patients History and Physical, chart, labs and discussed the procedure including the risks, benefits and alternatives for the proposed anesthesia with the patient or authorized representative who has indicated his/her understanding and acceptance.     Dental Advisory Given  Plan Discussed with: CRNA  Anesthesia Plan Comments:        Anesthesia Quick Evaluation

## 2023-10-06 NOTE — Op Note (Signed)
 [facility] Patient Name: Donna Mcknight Procedure Date: 10/06/2023 9:00 AM MRN: [id number] Date of Birth: May 20, 1995 Attending MD: Carlin POUR. Cindie , [place], [id number] CSN: [id number] Age: 28 Admit Type: Outpatient Procedure:                Upper GI endoscopy Indications:              Generalized abdominal pain, Abdominal bloating Providers:                Carlin POUR. Cindie, DO, Rosina Sprague, Emilee Tubb                            RN, RN, Daphne Mulch Technician, Technician Referring MD:              Medicines:                See the Anesthesia note for documentation of the                            administered medications Complications:            No immediate complications. Estimated Blood Loss:     Estimated blood loss was minimal. Procedure:                Pre-Anesthesia Assessment:                           - The anesthesia plan was to use monitored                            anesthesia care (MAC).                           After obtaining informed consent, the endoscope was                            passed under direct vision. Throughout the                            procedure, the patient's blood pressure, pulse, and                            oxygen saturations were monitored continuously. The                            [id number]-H190 ([id number]) scope was introduced through the                            mouth, and advanced to the second part of duodenum.                            The upper GI endoscopy was accomplished without                            difficulty. The patient tolerated the procedure  well. Scope In: 9:21:43 AM Scope Out: 9:24:46 AM Total Procedure Duration: 0 hours 3 minutes 3 seconds  Findings:      The Z-line was regular and was found 36 cm from the incisors.      Minimal inflammation was found in the gastric body. Biopsies were taken       with a cold forceps for Helicobacter pylori testing.      The duodenal bulb,  first portion of the duodenum and second portion of       the duodenum were normal. Biopsies for histology were taken with a cold       forceps for evaluation of celiac disease. Impression:               - Z-line regular, 36 cm from the incisors.                           - Gastritis. Biopsied.                           - Normal duodenal bulb, first portion of the                            duodenum and second portion of the duodenum.                            Biopsied. Moderate Sedation:      Per Anesthesia Care Recommendation:           - Patient has a contact number available for                            emergencies. The signs and symptoms of potential                            delayed complications were discussed with the                            patient. Return to normal activities tomorrow.                            Written discharge instructions were provided to the                            patient.                           - Resume previous diet.                           - Continue present medications.                           - Await pathology results.                           - Return to GI clinic in 8 weeks. Procedure Code(s):        --- Professional ---  56760, Esophagogastroduodenoscopy, flexible,                            transoral; with biopsy, single or multiple Diagnosis Code(s):        --- Professional ---                           K29.70, Gastritis, unspecified, without bleeding                           R10.84, Generalized abdominal pain                           R14.0, Abdominal distension (gaseous) CPT copyright 2022 American Medical Association. All rights reserved. The codes documented in this report are preliminary and upon coder review may  be revised to meet current compliance requirements. Carlin POUR. Cindie, DO Carlin POUR. Cindie, DO 10/06/2023 9:27:26 AM This report has been signed electronically. Number of Addenda:  0

## 2023-10-09 ENCOUNTER — Encounter (HOSPITAL_COMMUNITY): Payer: Self-pay | Admitting: Internal Medicine

## 2023-10-10 ENCOUNTER — Ambulatory Visit: Payer: Self-pay | Admitting: Internal Medicine

## 2023-10-10 ENCOUNTER — Ambulatory Visit (INDEPENDENT_AMBULATORY_CARE_PROVIDER_SITE_OTHER): Admitting: *Deleted

## 2023-10-10 DIAGNOSIS — M545 Low back pain, unspecified: Secondary | ICD-10-CM | POA: Diagnosis not present

## 2023-10-10 DIAGNOSIS — R109 Unspecified abdominal pain: Secondary | ICD-10-CM

## 2023-10-10 LAB — POCT URINALYSIS DIPSTICK
Bilirubin, UA: NEGATIVE
Glucose, UA: NEGATIVE
Ketones, UA: NEGATIVE
Nitrite, UA: NEGATIVE
Protein, UA: NEGATIVE

## 2023-10-10 LAB — SURGICAL PATHOLOGY

## 2023-10-10 NOTE — Progress Notes (Signed)
   NURSE VISIT- UTI SYMPTOMS   SUBJECTIVE:  Donna Mcknight is a 28 y.o. G8P2002 female here for UTI symptoms. She is a GYN patient. She reports flank pain bilaterally and pain at end of urination.  OBJECTIVE:  There were no vitals taken for this visit.  Appears well, in no apparent distress  Results for orders placed or performed in visit on 10/10/23 (from the past 24 hours)  POCT Urinalysis Dipstick   Collection Time: 10/10/23  9:03 AM  Result Value Ref Range   Color, UA     Clarity, UA     Glucose, UA Negative Negative   Bilirubin, UA neg    Ketones, UA neg    Spec Grav, UA     Blood, UA moderate    pH, UA     Protein, UA Negative Negative   Urobilinogen, UA     Nitrite, UA neg    Leukocytes, UA Small (1+) (A) Negative   Appearance     Odor      ASSESSMENT: GYN patient with UTI symptoms and negative nitrites  PLAN: Note routed to Delon Lewis, AGNP   Rx sent by provider today: No Urine culture sent Call or return to clinic prn if these symptoms worsen or fail to improve as anticipated. Follow-up: as needed   Alan LITTIE Fischer  10/10/2023 9:04 AM

## 2023-10-11 LAB — URINALYSIS
Bilirubin, UA: NEGATIVE
Glucose, UA: NEGATIVE
Ketones, UA: NEGATIVE
Nitrite, UA: NEGATIVE
Protein,UA: NEGATIVE
Specific Gravity, UA: 1.013 (ref 1.005–1.030)
Urobilinogen, Ur: 0.2 mg/dL (ref 0.2–1.0)
pH, UA: 6 (ref 5.0–7.5)

## 2023-10-13 LAB — URINE CULTURE

## 2023-10-15 ENCOUNTER — Ambulatory Visit: Payer: Self-pay

## 2023-10-16 ENCOUNTER — Other Ambulatory Visit: Payer: Self-pay | Admitting: Advanced Practice Midwife

## 2023-10-16 MED ORDER — NITROFURANTOIN MONOHYD MACRO 100 MG PO CAPS: 100.0000 mg | ORAL_CAPSULE | Freq: Two times a day (BID) | ORAL | 0 refills | Status: DC

## 2023-10-16 MED ORDER — PHENAZOPYRIDINE HCL 200 MG PO TABS: 200.0000 mg | ORAL_TABLET | Freq: Three times a day (TID) | ORAL | 0 refills | Status: DC

## 2023-10-17 ENCOUNTER — Ambulatory Visit: Payer: Self-pay | Admitting: Adult Health

## 2023-10-17 MED ORDER — SULFAMETHOXAZOLE-TRIMETHOPRIM 800-160 MG PO TABS
1.0000 | ORAL_TABLET | Freq: Two times a day (BID) | ORAL | 0 refills | Status: DC
Start: 2023-10-17 — End: 2023-10-26

## 2023-10-17 NOTE — H&P (Signed)
 Primary Care Physician:  Bevely Doffing, FNP Primary Gastroenterologist:  Dr. Cindie  Pre-Procedure History & Physical: HPI:  Donna Mcknight is a 28 y.o. female is here for an EGD and colonoscopy to be performed for chronic diarrhea, abdominal pain, abdominal bloating  Past Medical History:  Diagnosis Date   Anemia    as teenager   Anxiety    Breech presentation of fetus 02/15/2023   Depression    Gestational diabetes    first preg   History of postpartum hemorrhage, currently pregnant    History of postpartum hemorrhage, currently pregnant    Mental disorder    anxiety, depression   UTI (urinary tract infection)    Vaginal bleeding in pregnancy, third trimester 01/09/2023    Past Surgical History:  Procedure Laterality Date   COLONOSCOPY N/A 10/06/2023   Procedure: COLONOSCOPY;  Surgeon: Cindie Carlin POUR, DO;  [facility];  Service: Endoscopy;  Laterality: N/A;  900am, asa 2, random colon bx's   ESOPHAGOGASTRODUODENOSCOPY N/A 10/06/2023   Procedure: EGD (ESOPHAGOGASTRODUODENOSCOPY);  Surgeon: Cindie Carlin POUR, DO;  Location: [facility];  Service: Endoscopy;  Laterality: N/A;   MOUTH SURGERY      Prior to Admission medications   Medication Sig Start Date End Date Taking? Authorizing Provider  ibuprofen  (ADVIL ) 200 MG tablet Take 400 mg by mouth every 6 (six) hours as needed for moderate pain (pain score 4-6).   Yes [provider]  Levonorgestrel-Ethinyl Estradiol (AMETHIA) 0.1-0.02 & 0.01 MG tablet Take 1 tablet by mouth daily. 07/25/23  Yes Kizzie Suzen SAUNDERS, CNM  methocarbamol  (ROBAXIN ) 750 MG tablet Take 1 tablet (750 mg total) by mouth every 8 (eight) hours as needed for muscle spasms. 09/13/23  Yes Bevely Doffing, FNP  Multiple Vitamin (MULTIVITAMIN) tablet Take 1 tablet by mouth daily.   Yes [provider]  nitrofurantoin , macrocrystal-monohydrate, (MACROBID ) 100 MG capsule Take 1 capsule (100 mg total) by mouth 2 (two) times  daily. 10/16/23   Cresenzo-Dishmon, Cathlean, CNM  phenazopyridine  (PYRIDIUM ) 200 MG tablet Take 1 tablet (200 mg total) by mouth in the morning, at noon, and at bedtime. 10/16/23   Cresenzo-Dishmon, Cathlean, CNM  Probiotic Product (FORTIFY 50 BILLION PROBIOT 50+ PO) Take by mouth.   Yes [provider]  sulfamethoxazole -trimethoprim  (BACTRIM  DS) 800-160 MG tablet Take 1 tablet by mouth 2 (two) times daily. Take 1 bid 10/17/23   Signa Delon LABOR, NP    Allergies as of 08/29/2023 - Review Complete 08/21/2023  Allergen Reaction Noted   Nickel  01/08/2022   Other Hives and Swelling 10/14/2020    Family History  Problem Relation Age of Onset   Hypertension Mother    Fibroids Mother    Cirrhosis Father    Cancer Maternal Grandmother    Diabetes Maternal Grandfather     Social History   Socioeconomic History   Marital status: Married    Spouse name: Not on file   Number of children: 1   Years of education: Not on file   Highest education level: Professional school degree (e.g., MD, DDS, DVM, JD)  Occupational History   Not on file  Tobacco Use   Smoking status: Never   Smokeless tobacco: Never   Tobacco comments:    Quit 2014  Vaping Use   Vaping status: Former  Substance and Sexual Activity   Alcohol use: Not Currently    Alcohol/week: 10.0 standard drinks of alcohol    Types: 10 Glasses of wine per week  Comment: almost everyday   Drug use: Not Currently    Comment: prior to preg   Sexual activity: Yes    Birth control/protection: Implant  Other Topics Concern   Not on file  Social History Narrative   Not on file   Social Drivers of Health   Financial Resource Strain: Low Risk  (08/17/2023)   Overall Financial Resource Strain (CARDIA)    Difficulty of Paying Living Expenses: Not hard at all  Food Insecurity: No Food Insecurity (08/17/2023)   Hunger Vital Sign    Worried About Running Out of Food in the Last Year: Never true    Ran Out of Food in the Last  Year: Never true  Transportation Needs: No Transportation Needs (08/17/2023)   PRAPARE - Administrator, Civil Service (Medical): No    Lack of Transportation (Non-Medical): No  Physical Activity: Insufficiently Active (08/17/2023)   Exercise Vital Sign    Days of Exercise per Week: 2 days    Minutes of Exercise per Session: 20 min  Stress: No Stress Concern Present (08/17/2023)   [employer] of Occupational Health - Occupational Stress Questionnaire    Feeling of Stress : Only a little  Social Connections: Socially Isolated (08/17/2023)   Social Connection and Isolation Panel    Frequency of Communication with Friends and Family: Once a week    Frequency of Social Gatherings with Friends and Family: Never    Attends Religious Services: Never    Database administrator or Organizations: No    Attends Engineer, structural: Not on file    Marital Status: Married  Catering manager Violence: Not At Risk (03/08/2023)   Humiliation, Afraid, Rape, and Kick questionnaire    Fear of Current or Ex-Partner: No    Emotionally Abused: No    Physically Abused: No    Sexually Abused: No    Review of Systems: General: Negative for fever, chills, fatigue, weakness. Eyes: Negative for vision changes.  ENT: Negative for hoarseness, difficulty swallowing , nasal congestion. CV: Negative for chest pain, angina, palpitations, dyspnea on exertion, peripheral edema.  Respiratory: Negative for dyspnea at rest, dyspnea on exertion, cough, sputum, wheezing.  GI: See history of present illness. GU:  Negative for dysuria, hematuria, urinary incontinence, urinary frequency, nocturnal urination.  MS: Negative for joint pain, low back pain.  Derm: Negative for rash or itching.  Neuro: Negative for weakness, abnormal sensation, seizure, frequent headaches, memory loss, confusion.  Psych: Negative for anxiety, depression Endo: Negative for unusual weight change.  Heme: Negative for bruising  or bleeding. Allergy: Negative for rash or hives.  Physical Exam: Vital signs in last 24 hours:     General:   Alert,  Well-developed, well-nourished, pleasant and cooperative in NAD Head:  Normocephalic and atraumatic. Eyes:  Sclera clear, no icterus.   Conjunctiva pink. Ears:  Normal auditory acuity. Nose:  No deformity, discharge,  or lesions. Msk:  Symmetrical without gross deformities. Normal posture. Extremities:  Without clubbing or edema. Neurologic:  Alert and  oriented x4;  grossly normal neurologically. Skin:  Intact without significant lesions or rashes. Psych:  Alert and cooperative. Normal mood and affect.   Impression/Plan: Donna Mcknight is here for an EGD and colonoscopy to be performed for chronic diarrhea, abdominal pain, abdominal bloating  Risks, benefits, limitations, imponderables and alternatives regarding procedure have been reviewed with the patient. Questions have been answered. All parties agreeable.

## 2023-10-24 ENCOUNTER — Ambulatory Visit (INDEPENDENT_AMBULATORY_CARE_PROVIDER_SITE_OTHER): Admitting: Otolaryngology

## 2023-10-24 ENCOUNTER — Encounter (INDEPENDENT_AMBULATORY_CARE_PROVIDER_SITE_OTHER): Payer: Self-pay | Admitting: Otolaryngology

## 2023-10-24 VITALS — BP 127/87 | HR 66

## 2023-10-24 DIAGNOSIS — J34829 Nasal valve collapse, unspecified: Secondary | ICD-10-CM

## 2023-10-24 DIAGNOSIS — J3489 Other specified disorders of nose and nasal sinuses: Secondary | ICD-10-CM

## 2023-10-24 DIAGNOSIS — J342 Deviated nasal septum: Secondary | ICD-10-CM

## 2023-10-24 DIAGNOSIS — R0981 Nasal congestion: Secondary | ICD-10-CM | POA: Diagnosis not present

## 2023-10-24 DIAGNOSIS — J343 Hypertrophy of nasal turbinates: Secondary | ICD-10-CM

## 2023-10-24 NOTE — Progress Notes (Signed)
 Dear Dr. Bevely, Here is my assessment for our mutual patient, Donna Mcknight. Thank you for allowing me the opportunity to care for your patient. Please do not hesitate to contact me should you have any other questions. Sincerely, Dr. Eldora Blanch  Otolaryngology Clinic Note  HISTORY: Donna Mcknight is a 28 y.o. female kindly referred by Dr. Bevely for evaluation of nasal congestion and obstruction  Initial visit (10/2023): She reports breathing issues through my nose all my life. Constant. Both sides, but perhaps left worse than right. Has tried flonase, Rinses, PO anthistamines - have not helped. Breathe right strips have helped. Denies nasal trauma resulting in epistaxis. Nasal allergies during fall (itchy eyes, watery eyes, congestion, rhinorrhea) - no prior allergy testing. Does have history of sinus infections but reports quite infrequent - maybe about 1 infection/year. No current facial pain/pressure, no discolored drainage, hyposmia. No previous sinonasal surgery.  GLP-1: no AP/AC: no  Tobacco: no  PMHx: Abdominal Pain  RADIOGRAPHIC EVALUATION AND INDEPENDENT REVIEW OF OTHER RECORDS: Leita Bevely (08/21/2023): noted breathing issues through nose, since childhood. Dx: Nasal congestion; Rx: ref to ENT CBC and CMP 07/17/2023: WBC 5.3, Eos (07/2022): 100; BUN/Cr 9/0.86 CT Face 01/16/2022 independently interpreted with respect to sinuses/nasal cavity: noted diffuse paranasal sinus opacification (essentially pansinus opacification), with significant left septal deviation with large septal spur.  Past Medical History:  Diagnosis Date   Anemia    as teenager   Anxiety    Breech presentation of fetus 02/15/2023   Depression    Gestational diabetes    first preg   History of postpartum hemorrhage, currently pregnant    History of postpartum hemorrhage, currently pregnant    Mental disorder    anxiety, depression   UTI (urinary tract infection)    Vaginal bleeding  in pregnancy, third trimester 01/09/2023   Past Surgical History:  Procedure Laterality Date   COLONOSCOPY N/A 10/06/2023   Procedure: COLONOSCOPY;  Surgeon: Cindie Carlin POUR, DO;  [facility];  Service: Endoscopy;  Laterality: N/A;  900am, asa 2, random colon bx's   ESOPHAGOGASTRODUODENOSCOPY N/A 10/06/2023   Procedure: EGD (ESOPHAGOGASTRODUODENOSCOPY);  Surgeon: Cindie Carlin POUR, DO;  [facility];  Service: Endoscopy;  Laterality: N/A;   MOUTH SURGERY     Family History  Problem Relation Age of Onset   Hypertension Mother    Fibroids Mother    Cirrhosis Father    Cancer Maternal Grandmother    Diabetes Maternal Grandfather    Social History   Tobacco Use   Smoking status: Never   Smokeless tobacco: Never   Tobacco comments:    Quit 2014  Substance Use Topics   Alcohol use: Not Currently    Alcohol/week: 10.0 standard drinks of alcohol    Types: 10 Glasses of wine per week    Comment: almost everyday   Allergies  Allergen Reactions   Nickel     Other reaction(s): edema   Other Hives and Swelling    Nickel     Current Outpatient Medications  Medication Sig Dispense Refill   ibuprofen  (ADVIL ) 200 MG tablet Take 400 mg by mouth every 6 (six) hours as needed for moderate pain (pain score 4-6).     Levonorgestrel-Ethinyl Estradiol (AMETHIA) 0.1-0.02 & 0.01 MG tablet Take 1 tablet by mouth daily. 91 tablet 3   Multiple Vitamin (MULTIVITAMIN) tablet Take 1 tablet by mouth daily.     nitrofurantoin , macrocrystal-monohydrate, (MACROBID ) 100 MG capsule Take 1 capsule (100 mg total) by mouth 2 (  two) times daily. (Patient not taking: Reported on 10/24/2023) 14 capsule 0   omeprazole  (PRILOSEC) 20 MG capsule Take 20 mg by mouth daily.     phenazopyridine  (PYRIDIUM ) 200 MG tablet Take 1 tablet (200 mg total) by mouth in the morning, at noon, and at bedtime. (Patient not taking: Reported on 10/24/2023) 10 tablet 0   Probiotic Product (FORTIFY 50 BILLION PROBIOT  50+ PO) Take by mouth.     methocarbamol  (ROBAXIN ) 750 MG tablet Take 1 tablet (750 mg total) by mouth every 8 (eight) hours as needed for muscle spasms. (Patient not taking: Reported on 10/24/2023) 60 tablet 3   sulfamethoxazole -trimethoprim  (BACTRIM  DS) 800-160 MG tablet Take 1 tablet by mouth 2 (two) times daily. Take 1 bid (Patient not taking: Reported on 10/24/2023) 14 tablet 0   No current facility-administered medications for this visit.   BP 127/87   Pulse 66   SpO2 97%   PHYSICAL EXAM:  BP 127/87   Pulse 66   SpO2 97%    Salient findings:  CN II-XII intact Bilateral EAC clear and TM intact with well pneumatized middle ear spaces Nose: Anterior rhinoscopy reveals significant septal deviation left, bilateral inferior turbinate hypertrophy; modified cottle modest positive b/l, bilateral ULC modest collapse.  Nasal endoscopy was indicated to better evaluate the nose and paranasal sinuses, given the patient's history and exam findings, and is detailed below. No lesions of oral cavity/oropharynx No obviously palpable neck masses/lymphadenopathy/thyromegaly No respiratory distress or stridor   PROCEDURE:  Prior to initiating any procedures, risks/benefits/alternatives were explained to the patient and verbal consent obtained. Diagnostic Nasal Endoscopy Pre-procedure diagnosis: Nasal obstruction, nasal septal deviation Post-procedure diagnosis: same Indication: See pre-procedure diagnosis and physical exam above Complications: None apparent EBL: 0 mL Anesthesia: Lidocaine  4% and topical decongestant was topically sprayed in each nasal cavity  Description of Procedure:  Patient was identified. A rigid 30 degree endoscope was utilized to evaluate the sinonasal cavities, mucosa, sinus ostia and turbinates and septum.  Overall, signs of mucosal inflammation are not noted.  Also noted are massive left septal deviation.  No mucopurulence, polyps, or masses noted.   Right Middle meatus:  clear Right SE Recess: clear Left MM: clear Left SE Recess: clear  CPT CODE -- 31231 - Mod 25   ASSESSMENT:  28 y.o. with:  1. Nasal obstruction   2. Nasal congestion   3. Hypertrophy of both inferior nasal turbinates   4. Nasal septal deviation   5. Nasal valve collapse    Clear structural issue with significant septal deviation and turbinate hypertrophy. Does have h/o allergies but symptoms are perennial. In addition, though opacification on CT, she does not have any significant h/o frequent sinus infections so would observe.  We also discussed that she could potentially benefit from NV repair but she wishes to avoid any change in appearance of nose so decided to hold off.  PLAN: We've discussed issues and options today.    The risks, benefits and alternatives were discussed and questions answered.   We discussed the goals of septoplasty and turbinate reduction, and expectations for postoperative management. Will plan to leave splints in place, and removal was also discussed. We also discussed nasal obstruction post-operatively until splints in place and pain management.  We discussed R/B/A including pain, infection, bleeding (~3% risk of operative visit for control), persistent symptoms, need for revision surgery, and other risks including damage to surrounding structures, septal perforation, and much more rare risk of CSF leak, risks of anesthesia (low),  among others.  We discussed use of nasal saline spray and irrigations post-operatively We also discussed use of intranasal steroid post-operatively until healing occurs Patient understands and is ready to proceed.  - Use flonase daily two sprays - Posted for septo/turbs - f/u POD 5  See below regarding exact medications prescribed this encounter including dosages and route: No orders of the defined types were placed in this encounter.    Thank you for allowing me the opportunity to care for your patient. Please do not  hesitate to contact me should you have any other questions.  Sincerely, Eldora Blanch, MD Otolaryngologist (ENT), [facility] Phone: [phone] Fax: [phone]  MDM:  Level 4: [phone] Complexity/Problems addressed: mod - chronic problem with exacerbation Data complexity: mod - independent interpretation of CT imaging; review of notes, labs - Morbidity: mod - decision for surgery  - Prescription Drug prescribed or managed: no  10/24/2023, 11:35 AM

## 2023-10-26 ENCOUNTER — Other Ambulatory Visit (HOSPITAL_COMMUNITY)
Admission: RE | Admit: 2023-10-26 | Discharge: 2023-10-26 | Disposition: A | Discharge status: Home / Self Care | Source: Ambulatory Visit | Attending: Adult Health | Admitting: Adult Health

## 2023-10-26 ENCOUNTER — Encounter: Payer: Self-pay | Admitting: Adult Health

## 2023-10-26 ENCOUNTER — Ambulatory Visit (INDEPENDENT_AMBULATORY_CARE_PROVIDER_SITE_OTHER): Admitting: Adult Health

## 2023-10-26 VITALS — BP 127/82 | HR 72 | Ht 65.5 in | Wt 170.0 lb

## 2023-10-26 DIAGNOSIS — Z3041 Encounter for surveillance of contraceptive pills: Secondary | ICD-10-CM

## 2023-10-26 DIAGNOSIS — Z1331 Encounter for screening for depression: Secondary | ICD-10-CM | POA: Diagnosis not present

## 2023-10-26 DIAGNOSIS — N939 Abnormal uterine and vaginal bleeding, unspecified: Secondary | ICD-10-CM

## 2023-10-26 DIAGNOSIS — Z01419 Encounter for gynecological examination (general) (routine) without abnormal findings: Secondary | ICD-10-CM

## 2023-10-26 DIAGNOSIS — R102 Pelvic and perineal pain: Secondary | ICD-10-CM | POA: Diagnosis not present

## 2023-10-26 MED ORDER — LO LOESTRIN FE 1 MG-10 MCG / 10 MCG PO TABS: 1.0000 | ORAL_TABLET | Freq: Every day | ORAL | Status: DC

## 2023-10-26 NOTE — Progress Notes (Signed)
 Patient ID: Donna Mcknight, female   DOB: 1995/07/15, 28 y.o.   MRN: [id number] History of Present Illness: Donna Mcknight is a 28 year old white female, married, G2P2002 in for well woman gyn exam and pap. She has had bleeding for 19 days now on Amethia, and pelvic pain going into low back and feels bloated and tired with some dizziness. She had colonoscopy recently.  PCP is L Huenink FNP    Current Medications, Allergies, Past Medical History, Past Surgical History, Family History and Social History were reviewed in [employer] record.     Review of Systems: Patient denies any headaches, hearing loss, blurred vision, shortness of breath, chest pain, abdominal pain, problems with bowel movements, urination, or intercourse(not currently active). No joint pain or mood swings.  See HPI for positives Has had some of these sometimes before last pregnancy   Physical Exam:BP 127/82 (BP Location: Right Arm, Patient Position: Sitting, Cuff Size: Normal)   Pulse 72   Ht 5' 5.5 (1.664 m)   Wt 170 lb (77.1 kg)   LMP 10/08/2023 (Exact Date)   Breastfeeding No   BMI 27.86 kg/m   General:  Well developed, well nourished, no acute distress Skin:  Warm and dry Neck:  Midline trachea, normal thyroid, good ROM, no lymphadenopathy Lungs; Clear to auscultation bilaterally Breast:  No dominant palpable mass, retraction, or nipple discharge Cardiovascular: Regular rate and rhythm Abdomen:  Soft, non tender, no hepatosplenomegaly Pelvic:  External genitalia is normal in appearance, no lesions.  The vagina is normal in appearance, +period like blood. Urethra has no lesions or masses. The cervix is bulbous, no CMT, pap with HR HPV genotyping performed.  Uterus is felt to be normal size, shape, and contour,mildly tender.  No adnexal masses, + tenderness noted bilaterally.Bladder is non tender, no masses felt. Extremities/musculoskeletal:  No swelling or varicosities noted, no clubbing  or cyanosis Psych:  No mood changes, alert and cooperative,seems happy AA is 5 Fall risk is low    10/26/2023    1:41 PM 09/13/2023   10:53 AM 08/21/2023    2:27 PM  Depression screen PHQ 2/9  Decreased Interest 1 0 0  Down, Depressed, Hopeless 0 2 0  PHQ - 2 Score 1 2 0  Altered sleeping 1 0 0  Tired, decreased energy 2 2 1   Change in appetite 1 0 1  Feeling bad or failure about yourself  0 0 0  Trouble concentrating 0 0 0  Moving slowly or fidgety/restless 0 0 0  Suicidal thoughts 0 0 0  PHQ-9 Score 5 4 2   Difficult doing work/chores  Somewhat difficult Somewhat difficult       10/26/2023    1:41 PM 09/13/2023   10:53 AM 08/21/2023    2:28 PM 07/25/2023    3:48 PM  GAD 7 : Generalized Anxiety Score  Nervous, Anxious, on Edge 0 1 0 2  Control/stop worrying 0 0 0 0  Worry too much - different things 0 1 0 1  Trouble relaxing 1 0 1 1  Restless 0 0 0 0  Easily annoyed or irritable 2 1 1 3   Afraid - awful might happen 0 0 0 1  Total GAD 7 Score 3 3 2 8   Anxiety Difficulty  Somewhat difficult Not difficult at all     Upstream - 10/26/23 1337       Pregnancy Intention Screening   Does the patient want to become pregnant in the next year? No  Does the patient's partner want to become pregnant in the next year? No    Would the patient like to discuss contraceptive options today? No      Contraception Wrap Up   Current Method Oral Contraceptive    End Method Oral Contraceptive    Contraception Counseling Provided Yes           Examination chaperoned by Clarita Salt LPN   Impression and plan: 1. Encounter for gynecological examination with Papanicolaou smear of cervix (Primary) Pap sent Pap in 3 years if negative Physical in 1 year - Cytology - PAP( [place])  2. Abnormal uterine bleeding (AUB) Bleeding x 19 days Check labs and talk in am  Will get pelvic US  10/31/23 at [facility] at 11 am to assess uterus and ovaries - CBC - TSH - Comprehensive metabolic panel with  GFR - US  PELVIC COMPLETE WITH TRANSVAGINAL; Future Will stop amethia and start lo Loestrin  in am, 3 packs given Meds ordered this encounter  Medications   Norethindrone-Ethinyl Estradiol-Fe Biphas (LO LOESTRIN FE ) 1 MG-10 MCG / 10 MCG tablet    Sig: Take 1 tablet by mouth daily. Take 1 daily by mouth    BIN [id number], PCN [id number], GRP [id number],PI [id number]    Supervising Provider:   JAYNE MINDER H [[id number]]    3. Pelvic pain +pelvic pain radiates into back and feels bloated too  Will check labs and get pelvic US  to assess uterus and ovaries at [facility] 10/31/23 at 11 am - CBC - Comprehensive metabolic panel with GFR - US  PELVIC COMPLETE WITH TRANSVAGINAL; Future  4. Encounter for surveillance of contraceptive pills Stop amethia and start lo Loestrin  Follow up in 8 weeks for ROS

## 2023-10-27 ENCOUNTER — Ambulatory Visit: Payer: Self-pay | Admitting: Adult Health

## 2023-10-27 LAB — COMPREHENSIVE METABOLIC PANEL WITH GFR
ALT: 14 IU/L (ref 0–32)
AST: 17 IU/L (ref 0–40)
Albumin: 4.7 g/dL (ref 4.0–5.0)
Alkaline Phosphatase: 88 IU/L (ref 44–121)
BUN/Creatinine Ratio: 15 (ref 9–23)
BUN: 11 mg/dL (ref 6–20)
Bilirubin Total: 0.2 mg/dL (ref 0.0–1.2)
CO2: 21 mmol/L (ref 20–29)
Calcium: 9.2 mg/dL (ref 8.7–10.2)
Chloride: 102 mmol/L (ref 96–106)
Creatinine, Ser: 0.71 mg/dL (ref 0.57–1.00)
Globulin, Total: 2.3 g/dL (ref 1.5–4.5)
Glucose: 79 mg/dL (ref 70–99)
Potassium: 4.2 mmol/L (ref 3.5–5.2)
Sodium: 137 mmol/L (ref 134–144)
Total Protein: 7 g/dL (ref 6.0–8.5)
eGFR: 118 mL/min/1.73 (ref >59)

## 2023-10-27 LAB — CBC
Hematocrit: 37.8 % (ref 34.0–46.6)
Hemoglobin: 12.5 g/dL (ref 11.1–15.9)
MCH: 29.8 pg (ref 26.6–33.0)
MCHC: 33.1 g/dL (ref 31.5–35.7)
MCV: 90 fL (ref 79–97)
Platelets: 324 x10E3/uL (ref 150–450)
RBC: 4.19 x10E6/uL (ref 3.77–5.28)
RDW: 12.1 % (ref 11.7–15.4)
WBC: 6.8 x10E3/uL (ref 3.4–10.8)

## 2023-10-27 LAB — TSH: TSH: 1.21 u[IU]/mL (ref 0.450–4.500)

## 2023-10-30 ENCOUNTER — Encounter: Payer: Self-pay | Admitting: Women's Health

## 2023-10-31 ENCOUNTER — Ambulatory Visit (HOSPITAL_COMMUNITY)
Admission: RE | Admit: 2023-10-31 | Discharge: 2023-10-31 | Disposition: A | Discharge status: Home / Self Care | Source: Ambulatory Visit | Attending: Adult Health | Admitting: Adult Health

## 2023-10-31 DIAGNOSIS — N939 Abnormal uterine and vaginal bleeding, unspecified: Secondary | ICD-10-CM | POA: Insufficient documentation

## 2023-10-31 DIAGNOSIS — R102 Pelvic and perineal pain: Secondary | ICD-10-CM | POA: Diagnosis not present

## 2023-11-02 LAB — CYTOLOGY - PAP
Comment: NEGATIVE
Diagnosis: NEGATIVE
High risk HPV: NEGATIVE

## 2023-11-07 ENCOUNTER — Other Ambulatory Visit: Payer: Self-pay | Admitting: Adult Health

## 2023-11-07 MED ORDER — MEGESTROL ACETATE 40 MG/ML PO SUSP: ORAL | 0 refills | Status: DC

## 2023-11-07 NOTE — Progress Notes (Signed)
Rx megace??  

## 2023-11-13 DIAGNOSIS — T63481A Toxic effect of venom of other arthropod, accidental (unintentional), initial encounter: Secondary | ICD-10-CM | POA: Diagnosis not present

## 2023-11-13 DIAGNOSIS — M79621 Pain in right upper arm: Secondary | ICD-10-CM | POA: Diagnosis not present

## 2023-11-15 ENCOUNTER — Other Ambulatory Visit: Payer: Self-pay | Admitting: Adult Health

## 2023-11-15 MED ORDER — NORETHINDRONE ACETATE 5 MG PO TABS
5.0000 mg | ORAL_TABLET | Freq: Every day | ORAL | 0 refills | Status: DC
Start: 2023-11-15 — End: 2023-11-16

## 2023-11-15 MED ORDER — MEDROXYPROGESTERONE ACETATE 5 MG PO TABS: 5.0000 mg | ORAL_TABLET | Freq: Every day | ORAL | 0 refills | Status: DC

## 2023-11-15 NOTE — Progress Notes (Unsigned)
 Will cancel provera  and rx aygestin 

## 2023-11-15 NOTE — Progress Notes (Signed)
 Rx provera  5 mg x 5 days stop megace 

## 2023-11-16 ENCOUNTER — Encounter: Payer: Self-pay | Admitting: Adult Health

## 2023-11-16 ENCOUNTER — Telehealth: Payer: Self-pay | Admitting: Adult Health

## 2023-11-16 ENCOUNTER — Ambulatory Visit: Admitting: Adult Health

## 2023-11-16 VITALS — BP 120/83 | HR 78 | Ht 65.5 in | Wt 171.5 lb

## 2023-11-16 DIAGNOSIS — R140 Abdominal distension (gaseous): Secondary | ICD-10-CM | POA: Diagnosis not present

## 2023-11-16 DIAGNOSIS — R102 Pelvic and perineal pain: Secondary | ICD-10-CM

## 2023-11-16 DIAGNOSIS — N941 Unspecified dyspareunia: Secondary | ICD-10-CM | POA: Diagnosis not present

## 2023-11-16 DIAGNOSIS — N939 Abnormal uterine and vaginal bleeding, unspecified: Secondary | ICD-10-CM

## 2023-11-16 DIAGNOSIS — N816 Rectocele: Secondary | ICD-10-CM

## 2023-11-16 MED ORDER — MEDROXYPROGESTERONE ACETATE 5 MG PO TABS: ORAL_TABLET | ORAL | 0 refills | Status: DC

## 2023-11-16 NOTE — Telephone Encounter (Signed)
 Patient has a bulge in vagina. Please advise. Has been talking to [facility] on MyChart about medicine for bleeding.

## 2023-11-16 NOTE — Telephone Encounter (Signed)
 Pt has bulge in vagina. JAG to work pt in at 1:50 pm. Pt aware. JSY

## 2023-11-16 NOTE — Progress Notes (Signed)
  Subjective:     Patient ID: Donna Mcknight, female   DOB: 1995/08/31, 28 y.o.   MRN: [id number]  HPI Donna Mcknight is a 28 year old white female, married, G2P2002, worked in for bulging in vagina. She has had bleeding since first of July, even megace  did not help. Has  painful bloating, pain with periods and pain with sex, pain when not on period, felt best when pregnant. She had normal US  10/11/23. TSH,CBC and CMP was normal 10/26/23.     Component Value Date/Time   DIAGPAP  10/26/2023 1339    - Negative for intraepithelial lesion or malignancy (NILM)   DIAGPAP  08/17/2020 0929    - Negative for intraepithelial lesion or malignancy (NILM)   HPVHIGH Negative 10/26/2023 1339   HPVHIGH Negative 08/17/2020 0929   ADEQPAP  10/26/2023 1339    Satisfactory for evaluation; transformation zone component PRESENT.   ADEQPAP  08/17/2020 0929    Satisfactory for evaluation; transformation zone component PRESENT.   PCP is Leita Longs NP Review of Systems Noticed bulge in vagina today See HPI for other positives Reviewed past medical,surgical, social and family history. Reviewed medications and allergies.     Objective:   Physical Exam BP 120/83 (BP Location: Right Arm, Patient Position: Sitting, Cuff Size: Normal)   Pulse 78   Ht 5' 5.5 (1.664 m)   Wt 171 lb 8 oz (77.8 kg)   LMP 10/08/2023 (Exact Date)   Breastfeeding No   BMI 28.11 kg/m     Skin warm and dry.Pelvic: external genitalia is normal in appearance no lesions, vagina: scant blood,urethra has no lesions or masses noted, cervix:smooth and bulbous, uterus: normal size, shape and contour, non tender, no masses felt, adnexa: no masses or tenderness noted. Bladder is non tender and no masses felt. On rectal exam has low rectocele.   Upstream - 11/16/23 1359       Pregnancy Intention Screening   Does the patient want to become pregnant in the next year? No    Does the patient's partner want to become pregnant in the next year? No     Would the patient like to discuss contraceptive options today? No      Contraception Wrap Up   Current Method Abstinence    End Method Female Condom         Examination chaperoned by Clarita Salt LPN  Discussed pt with Dr Ozan  Assessment:     1. Pelvic pain Has had pelvic pain except when pregnant,  Has pain with periods  Discussed could be endometriosis, review handout   2. Abnormal uterine bleeding (AUB) (Primary) She had bleeding with amethia, lo loestin and megace   Stop megace  will rx provera  5 mg 1 daily for 5 days Meds ordered this encounter  Medications   medroxyPROGESTERone  (PROVERA ) 5 MG tablet    Sig: Take 1 daily for 5 days    Dispense:  5 tablet    Refill:  0    Supervising Provider:   JAYNE, LUTHER H [[id number]]     3. Bloating  4. Dyspareunia, female Has pain with sex   5. Rectocele     Plan:     Return 11/29/23 to see Dr Ozan to discuss diagnostic lap. To see if any endometriosis  and pre op

## 2023-11-20 ENCOUNTER — Encounter: Payer: Self-pay | Admitting: Obstetrics & Gynecology

## 2023-11-21 ENCOUNTER — Other Ambulatory Visit: Payer: Self-pay | Admitting: Adult Health

## 2023-11-21 MED ORDER — NORGESTIMATE-ETH ESTRADIOL 0.25-35 MG-MCG PO TABS: 1.0000 | ORAL_TABLET | Freq: Every day | ORAL | 1 refills | Status: DC

## 2023-11-21 NOTE — Progress Notes (Signed)
 Rx sprintec

## 2023-11-23 ENCOUNTER — Ambulatory Visit: Admitting: Obstetrics & Gynecology

## 2023-11-23 VITALS — BP 131/88 | HR 82 | Ht 65.5 in | Wt 170.0 lb

## 2023-11-23 DIAGNOSIS — N8003 Adenomyosis of the uterus: Secondary | ICD-10-CM

## 2023-11-23 DIAGNOSIS — N941 Unspecified dyspareunia: Secondary | ICD-10-CM

## 2023-11-23 DIAGNOSIS — N938 Other specified abnormal uterine and vaginal bleeding: Secondary | ICD-10-CM

## 2023-11-23 MED ORDER — MYFEMBREE 40-1-0.5 MG PO TABS
1.0000 | ORAL_TABLET | Freq: Every day | ORAL | 11 refills | Status: DC
Start: 2023-11-23 — End: 2023-12-04

## 2023-11-23 NOTE — Progress Notes (Signed)
 Follow up appointment for results: sonogram  Chief Complaint  Patient presents with   Follow-up    Discuss surgery    Blood pressure 131/88, pulse 82, height 5' 5.5 (1.664 m), weight 170 lb (77.1 kg), last menstrual period 10/08/2023, not currently breastfeeding. [id number]    Narrative & Impression  CLINICAL DATA:  Abnormal uterine bleeding, pelvic pain   EXAM: TRANSABDOMINAL AND TRANSVAGINAL ULTRASOUND OF PELVIS   TECHNIQUE: Both transabdominal and transvaginal ultrasound examinations of the pelvis were performed. Transabdominal technique was performed for global imaging of the pelvis including uterus, ovaries, adnexal regions, and pelvic cul-de-sac. It was necessary to proceed with endovaginal exam following the transabdominal exam to visualize the uterus, endometrium, ovaries and adnexa.   COMPARISON:  07/06/2023   FINDINGS: Uterus   Measurements: 8.5 x 4.3 x 5.0 cm = volume: 95 mL. No fibroids or other mass visualized.   Endometrium   Thickness: 11 mm in thickness.  No focal abnormality visualized.   Right ovary   Measurements: 2.8 x 1.0 x 2.3 cm = volume: 3.4 mL. Normal appearance/no adnexal mass.   Left ovary   Measurements: 2.8 x 1.8 x 1.4 cm = volume: 3.6 mL. Normal appearance/no adnexal mass.   Other findings   Small amount of free fluid in the cul-de-sac, likely physiologic.   IMPRESSION: No acute findings or significant abnormality.     Electronically Signed   By: Franky Crease M.D.   On: 11/06/2023 22:35      Result History   MEDS ordered this encounter: Meds ordered this encounter  Medications   Relugolix-Estradiol -Norethind (MYFEMBREE ) 40-1-0.5 MG TABS    Sig: Take 1 tablet by mouth daily.    Dispense:  30 tablet    Refill:  11    Orders for this encounter: No orders of the defined types were placed in this encounter.   Impression + Management Plan   ICD-10-CM   1. DUB (dysfunctional uterine bleeding)  N93.8     2.  Dyspareunia, female  N94.10     3. Adenomyosis: by sonogram  N80.03      Empiric Trial of myfembree  for management fo adenomyosis and suspected endometriosis See previous notes Hopefully it will manage her DUB as well   Follow Up: Return in about 6 weeks (around 01/04/2024) for Follow up, with Dr Jayne.     All questions were answered.  Past Medical History:  Diagnosis Date   Anemia    as teenager   Anxiety    Breech presentation of fetus 02/15/2023   Depression    Gestational diabetes    first preg   History of postpartum hemorrhage, currently pregnant    History of postpartum hemorrhage, currently pregnant    Mental disorder    anxiety, depression   UTI (urinary tract infection)    Vaginal bleeding in pregnancy, third trimester 01/09/2023    Past Surgical History:  Procedure Laterality Date   COLONOSCOPY N/A 10/06/2023   Procedure: COLONOSCOPY;  Surgeon: Cindie Carlin POUR, DO;  Location: [facility];  Service: Endoscopy;  Laterality: N/A;  900am, asa 2, random colon bx's   ESOPHAGOGASTRODUODENOSCOPY N/A 10/06/2023   Procedure: EGD (ESOPHAGOGASTRODUODENOSCOPY);  Surgeon: Cindie Carlin POUR, DO;  Location: [facility];  Service: Endoscopy;  Laterality: N/A;   MOUTH SURGERY      OB History     Gravida  2   Para  2   Term  2   Preterm  0   AB  0  Living  2      SAB  0   IAB  0   Ectopic  0   Multiple  0   Live Births  2           Allergies  Allergen Reactions   Nickel     Other reaction(s): edema   Other Hives and Swelling    Nickel      Social History   Socioeconomic History   Marital status: Married    Spouse name: Not on file   Number of children: 1   Years of education: Not on file   Highest education level: Professional school degree (e.g., MD, DDS, DVM, JD)  Occupational History   Not on file  Tobacco Use   Smoking status: Never   Smokeless tobacco: Never   Tobacco comments:    Quit 2014  Vaping Use   Vaping  status: Former  Substance and Sexual Activity   Alcohol use: Yes    Alcohol/week: 10.0 standard drinks of alcohol    Types: 10 Glasses of wine per week   Drug use: Not Currently    Comment: prior to preg   Sexual activity: Not Currently    Birth control/protection: None, Condom  Other Topics Concern   Not on file  Social History Narrative   Not on file   Social Drivers of Health   Financial Resource Strain: Low Risk  (10/26/2023)   Overall Financial Resource Strain (CARDIA)    Difficulty of Paying Living Expenses: Not hard at all  Food Insecurity: No Food Insecurity (10/26/2023)   Hunger Vital Sign    Worried About Running Out of Food in the Last Year: Never true    Ran Out of Food in the Last Year: Never true  Transportation Needs: No Transportation Needs (10/26/2023)   PRAPARE - Administrator, Civil Service (Medical): No    Lack of Transportation (Non-Medical): No  Physical Activity: Insufficiently Active (10/26/2023)   Exercise Vital Sign    Days of Exercise per Week: 2 days    Minutes of Exercise per Session: 30 min  Stress: Stress Concern Present (10/26/2023)   [employer] of Occupational Health - Occupational Stress Questionnaire    Feeling of Stress: To some extent  Social Connections: Socially Isolated (10/26/2023)   Social Connection and Isolation Panel    Frequency of Communication with Friends and Family: Once a week    Frequency of Social Gatherings with Friends and Family: Once a week    Attends Religious Services: Never    Database administrator or Organizations: No    Attends Engineer, structural: Never    Marital Status: Married    Family History  Problem Relation Age of Onset   Hypertension Mother    Fibroids Mother    Cirrhosis Father    Cancer Maternal Grandmother    Diabetes Maternal Grandfather

## 2023-11-24 ENCOUNTER — Encounter: Payer: Self-pay | Admitting: Obstetrics & Gynecology

## 2023-11-29 ENCOUNTER — Encounter: Admitting: Obstetrics & Gynecology

## 2023-11-30 ENCOUNTER — Encounter: Payer: Self-pay | Admitting: Obstetrics & Gynecology

## 2023-11-30 ENCOUNTER — Ambulatory Visit: Admitting: Gastroenterology

## 2023-12-04 ENCOUNTER — Telehealth: Payer: Self-pay | Admitting: Adult Health

## 2023-12-04 ENCOUNTER — Other Ambulatory Visit: Payer: Self-pay | Admitting: *Deleted

## 2023-12-04 MED ORDER — MYFEMBREE 40-1-0.5 MG PO TABS: 1.0000 | ORAL_TABLET | Freq: Every day | ORAL | 11 refills | Status: DC

## 2023-12-04 NOTE — Telephone Encounter (Signed)
 Patient calling stating that she has sent multi message with no answers. She is wanting more samples and states that she has information on insurance for the prior auth and wanting a order sent for imaging to [facility]. Asking if someone will call her back

## 2023-12-05 ENCOUNTER — Ambulatory Visit: Payer: Self-pay

## 2023-12-05 ENCOUNTER — Other Ambulatory Visit: Payer: Self-pay | Admitting: Obstetrics & Gynecology

## 2023-12-05 DIAGNOSIS — N939 Abnormal uterine and vaginal bleeding, unspecified: Secondary | ICD-10-CM

## 2023-12-05 NOTE — Progress Notes (Signed)
 Referral sent to [facility] per patient request.

## 2023-12-07 DIAGNOSIS — N939 Abnormal uterine and vaginal bleeding, unspecified: Secondary | ICD-10-CM | POA: Diagnosis not present

## 2023-12-12 ENCOUNTER — Encounter: Payer: Self-pay | Admitting: *Deleted

## 2023-12-12 DIAGNOSIS — N3289 Other specified disorders of bladder: Secondary | ICD-10-CM | POA: Diagnosis not present

## 2023-12-12 DIAGNOSIS — N8003 Adenomyosis of the uterus: Secondary | ICD-10-CM | POA: Diagnosis not present

## 2023-12-19 ENCOUNTER — Other Ambulatory Visit: Payer: Self-pay | Admitting: *Deleted

## 2023-12-19 MED ORDER — MYFEMBREE 40-1-0.5 MG PO TABS: 1.0000 | ORAL_TABLET | Freq: Every day | ORAL | 11 refills | Status: DC

## 2023-12-25 ENCOUNTER — Ambulatory Visit: Admitting: Adult Health

## 2023-12-26 ENCOUNTER — Other Ambulatory Visit: Payer: Self-pay | Admitting: Obstetrics & Gynecology

## 2023-12-26 MED ORDER — RELUGOLIX-ESTRADIOL-NORETHIND 40-1-0.5 MG PO TABS: ORAL_TABLET | ORAL | Status: DC

## 2024-01-01 ENCOUNTER — Ambulatory Visit: Admitting: Obstetrics & Gynecology

## 2024-01-01 ENCOUNTER — Encounter: Payer: Self-pay | Admitting: Obstetrics & Gynecology

## 2024-01-01 VITALS — BP 107/73 | HR 80 | Ht 65.0 in | Wt 173.0 lb

## 2024-01-01 DIAGNOSIS — N938 Other specified abnormal uterine and vaginal bleeding: Secondary | ICD-10-CM | POA: Diagnosis not present

## 2024-01-01 DIAGNOSIS — N8003 Adenomyosis of the uterus: Secondary | ICD-10-CM | POA: Diagnosis not present

## 2024-01-01 DIAGNOSIS — N941 Unspecified dyspareunia: Secondary | ICD-10-CM

## 2024-01-01 MED ORDER — ORILISSA 200 MG PO TABS: 1.0000 | ORAL_TABLET | Freq: Two times a day (BID) | ORAL | 11 refills | Status: DC

## 2024-01-02 ENCOUNTER — Ambulatory Visit: Admitting: Obstetrics & Gynecology

## 2024-01-02 MED ORDER — ESTRADIOL-NORETHINDRONE ACET 1-0.5 MG PO TABS: ORAL_TABLET | ORAL | 11 refills | Status: AC

## 2024-01-02 NOTE — Addendum Note (Signed)
 Addended by: JAYNE VONN DEL on: 01/02/2024 12:12 PM   Modules accepted: Orders

## 2024-01-03 ENCOUNTER — Other Ambulatory Visit (HOSPITAL_COMMUNITY)
Admission: RE | Admit: 2024-01-03 | Discharge: 2024-01-03 | Disposition: A | Discharge status: Home / Self Care | Source: Ambulatory Visit | Attending: Obstetrics & Gynecology | Admitting: Obstetrics & Gynecology

## 2024-01-03 ENCOUNTER — Ambulatory Visit

## 2024-01-03 DIAGNOSIS — N898 Other specified noninflammatory disorders of vagina: Secondary | ICD-10-CM | POA: Insufficient documentation

## 2024-01-03 NOTE — Progress Notes (Signed)
   NURSE VISIT- VAGINITIS/STD/POC  SUBJECTIVE:  Donna Mcknight is a 28 y.o. [id number] GYN patientfemale here for a vaginal swab for vaginitis screening.  She reports the following symptoms: local irritation and odor for 2 days. Denies abnormal vaginal bleeding, significant pelvic pain, fever, or UTI symptoms.  OBJECTIVE:  There were no vitals taken for this visit.  Appears well, in no apparent distress  ASSESSMENT: Vaginal swab for vaginitis screening  PLAN: Self-collected vaginal probe for Bacterial Vaginosis, Yeast sent to lab Treatment: to be determined once results are received Follow-up as needed if symptoms persist/worsen, or new symptoms develop  Aleck FORBES Blase  01/03/2024 10:47 AM

## 2024-01-04 ENCOUNTER — Ambulatory Visit: Payer: Self-pay | Admitting: Adult Health

## 2024-01-04 ENCOUNTER — Encounter: Payer: Self-pay | Admitting: *Deleted

## 2024-01-04 LAB — CERVICOVAGINAL ANCILLARY ONLY
Bacterial Vaginitis (gardnerella): NEGATIVE
Candida Glabrata: NEGATIVE
Candida Vaginitis: POSITIVE — AB
Comment: NEGATIVE
Comment: NEGATIVE
Comment: NEGATIVE

## 2024-01-04 MED ORDER — FLUCONAZOLE 150 MG PO TABS: ORAL_TABLET | ORAL | 1 refills | Status: DC

## 2024-01-09 ENCOUNTER — Encounter: Payer: Self-pay | Admitting: Adult Health

## 2024-01-09 ENCOUNTER — Ambulatory Visit: Admitting: Adult Health

## 2024-01-09 VITALS — BP 117/80 | HR 74 | Ht 65.5 in | Wt 173.0 lb

## 2024-01-09 DIAGNOSIS — N8112 Cystocele, lateral: Secondary | ICD-10-CM | POA: Diagnosis not present

## 2024-01-09 NOTE — Progress Notes (Signed)
  Subjective:     Patient ID: Donna Mcknight, female   DOB: 1995/04/24, 28 y.o.   MRN: [id number]  HPI Jinx is a 28 year old white female, married, G2P2002, in complaining of possible bladder prolapse, noticed about a week ago.     Component Value Date/Time   DIAGPAP  10/26/2023 1339    - Negative for intraepithelial lesion or malignancy (NILM)   DIAGPAP  08/17/2020 0929    - Negative for intraepithelial lesion or malignancy (NILM)   HPVHIGH Negative 10/26/2023 1339   HPVHIGH Negative 08/17/2020 0929   ADEQPAP  10/26/2023 1339    Satisfactory for evaluation; transformation zone component PRESENT.   ADEQPAP  08/17/2020 0929    Satisfactory for evaluation; transformation zone component PRESENT.   PCP is Leita Longs NP  Review of Systems possible bladder prolapse, noticed bulge out of vagina, about a week ago.    Reviewed past medical,surgical, social and family history. Reviewed medications and allergies.  Objective:   Physical Exam BP 117/80 (BP Location: Right Arm, Patient Position: Sitting, Cuff Size: Normal)   Pulse 74   Ht 5' 5.5 (1.664 m)   Wt 173 lb (78.5 kg)   LMP 12/25/2023   Breastfeeding No   BMI 28.35 kg/m     Skin warm and dry.Pelvic: external genitalia is normal in appearance no lesions,has clit ring, vagina: pink and moist, mild cystocele noted, urethra has no lesions or masses noted, cervix:smooth and bulbous, uterus: normal size, shape and contour, non tender, no masses felt, adnexa: no masses or tenderness noted. Bladder is non tender and no masses felt.  Fall risk is low  Upstream - 01/09/24 1020       Pregnancy Intention Screening   Does the patient want to become pregnant in the next year? No    Does the patient's partner want to become pregnant in the next year? No    Would the patient like to discuss contraceptive options today? No      Contraception Wrap Up   Current Method Oral Contraceptive    End Method Oral Contraceptive     Contraception Counseling Provided No         Examination chaperoned by Tish RN  Assessment:     1. Cystocele, lateral (Primary) Mild cystocele Do kegels, review handouts and video If seems to not be better, will refer for pelvic floor PT Try not to strain or left heavy objects    Plan:     Follow up prn

## 2024-01-17 NOTE — Progress Notes (Signed)
 Donna Mcknight                                          MRN: [id number]   01/17/2024   The [facility] Quality Team Specialist reviewed this patient medical record for the purposes of chart review for care gap closure. The following were reviewed: chart review for care gap closure-glycemic status assessment.    [facility] Quality Team

## 2024-01-29 ENCOUNTER — Telehealth (INDEPENDENT_AMBULATORY_CARE_PROVIDER_SITE_OTHER): Payer: Self-pay

## 2024-01-29 DIAGNOSIS — R0981 Nasal congestion: Secondary | ICD-10-CM

## 2024-01-29 DIAGNOSIS — J3489 Other specified disorders of nose and nasal sinuses: Secondary | ICD-10-CM

## 2024-01-29 DIAGNOSIS — J342 Deviated nasal septum: Secondary | ICD-10-CM

## 2024-01-29 DIAGNOSIS — J343 Hypertrophy of nasal turbinates: Secondary | ICD-10-CM

## 2024-01-29 NOTE — Telephone Encounter (Signed)
 Pt called stated that last time she seen patel they had talked about doing surgery. She had a lot of medical things going on she is ready to schedule the surgery now if Tobie is still willing to do it.

## 2024-01-30 NOTE — Addendum Note (Signed)
 Addended by: Maysun Meditz on: 01/30/2024 07:49 AM   Modules accepted: Orders

## 2024-01-31 ENCOUNTER — Telehealth (INDEPENDENT_AMBULATORY_CARE_PROVIDER_SITE_OTHER): Payer: Self-pay | Admitting: Otolaryngology

## 2024-01-31 NOTE — Telephone Encounter (Signed)
 LVM for patient to call back to schedule telephone appointment with Dr. Tobie for this Thursday.

## 2024-02-04 ENCOUNTER — Encounter: Payer: Self-pay | Admitting: Obstetrics & Gynecology

## 2024-02-05 ENCOUNTER — Encounter (INDEPENDENT_AMBULATORY_CARE_PROVIDER_SITE_OTHER): Payer: Self-pay | Admitting: Otolaryngology

## 2024-02-05 ENCOUNTER — Ambulatory Visit (INDEPENDENT_AMBULATORY_CARE_PROVIDER_SITE_OTHER): Admitting: Otolaryngology

## 2024-02-05 DIAGNOSIS — J343 Hypertrophy of nasal turbinates: Secondary | ICD-10-CM | POA: Diagnosis not present

## 2024-02-05 DIAGNOSIS — J342 Deviated nasal septum: Secondary | ICD-10-CM

## 2024-02-05 DIAGNOSIS — R0981 Nasal congestion: Secondary | ICD-10-CM

## 2024-02-05 DIAGNOSIS — J3489 Other specified disorders of nose and nasal sinuses: Secondary | ICD-10-CM

## 2024-02-05 NOTE — Progress Notes (Signed)
 Dear Dr. Bevely, Here is my assessment for our mutual patient, Donna Mcknight. Thank you for allowing me the opportunity to care for your patient. Please do not hesitate to contact me should you have any other questions. Sincerely, Dr. Eldora Blanch  Otolaryngology Clinic Note  HISTORY: Donna Mcknight is a 28 y.o. female kindly referred by Dr. Bevely for evaluation of nasal congestion and obstruction  Initial visit (10/2023): She reports breathing issues through my nose all my life. Constant. Both sides, but perhaps left worse than right. Has tried flonase, Rinses, PO anthistamines - have not helped. Breathe right strips have helped. Denies nasal trauma resulting in epistaxis. Nasal allergies during fall (itchy eyes, watery eyes, congestion, rhinorrhea) - no prior allergy testing. Does have history of sinus infections but reports quite infrequent - maybe about 1 infection/year. No current facial pain/pressure, no discolored drainage, hyposmia. No previous sinonasal surgery.  --------------------------------------------------------- 02/05/2024 The patient gave consent to have this visit done by telemedicine / virtual visit, two identifiers were used to identify patient. This is also consent for access the chart and treat the patient via this visit. The patient is located [place] .  I, the provider, am at the office.  We spent 15 minutes together for the visit. Mode of visit was audio only  She had other issues going on so had decided to put off surgery, but now is a better time. Continues to report left sided congestion. Tried flonase, rinses, PO anthistamine. Still no help. She reiterates that she does not wish for any change to the appearance of her nose. We again discussed R/B/A for surgery which she desires. No sinus infections.  GLP-1: no AP/AC: no  Tobacco: no  PMHx: Abdominal Pain  RADIOGRAPHIC EVALUATION AND INDEPENDENT REVIEW OF OTHER RECORDS: Leita Bevely  (08/21/2023): noted breathing issues through nose, since childhood. Dx: Nasal congestion; Rx: ref to ENT CBC and CMP 07/17/2023: WBC 5.3, Eos (07/2022): 100; BUN/Cr 9/0.86 CT Face 01/16/2022 independently interpreted with respect to sinuses/nasal cavity: noted diffuse paranasal sinus opacification (essentially pansinus opacification), with significant left septal deviation with large septal spur.  Past Medical History:  Diagnosis Date   Anemia    as teenager   Anxiety    Breech presentation of fetus 02/15/2023   Depression    Gestational diabetes    first preg   History of postpartum hemorrhage, currently pregnant    History of postpartum hemorrhage, currently pregnant    Mental disorder    anxiety, depression   UTI (urinary tract infection)    Vaginal bleeding in pregnancy, third trimester 01/09/2023   Past Surgical History:  Procedure Laterality Date   COLONOSCOPY N/A 10/06/2023   Procedure: COLONOSCOPY;  Surgeon: Cindie Carlin POUR, DO;  Location: [facility];  Service: Endoscopy;  Laterality: N/A;  900am, asa 2, random colon bx's   ESOPHAGOGASTRODUODENOSCOPY N/A 10/06/2023   Procedure: EGD (ESOPHAGOGASTRODUODENOSCOPY);  Surgeon: Cindie Carlin POUR, DO;  Location: [facility];  Service: Endoscopy;  Laterality: N/A;   MOUTH SURGERY     Family History  Problem Relation Age of Onset   Hypertension Mother    Fibroids Mother    Cirrhosis Father    Cancer Maternal Grandmother    Diabetes Maternal Grandfather    Social History   Tobacco Use   Smoking status: Never   Smokeless tobacco: Never   Tobacco comments:    Quit 2014  Substance Use Topics   Alcohol use: Yes    Alcohol/week: 10.0 standard drinks of alcohol  Types: 10 Glasses of wine per week   Allergies  Allergen Reactions   Nickel     Other reaction(s): edema   Other Hives and Swelling    Nickel     Current Outpatient Medications  Medication Sig Dispense Refill   Elagolix Sodium  (ORILISSA ) 200 MG TABS Take 1  tablet (200 mg total) by mouth 2 (two) times daily. 60 tablet 11   estradiol -norethindrone  (ACTIVELLA) 1-0.5 MG tablet 1 tablet daily 28 tablet 11   Multiple Vitamin (MULTIVITAMIN) tablet Take 1 tablet by mouth daily.     omeprazole  (PRILOSEC) 20 MG capsule Take 20 mg by mouth daily.     Probiotic Product (FORTIFY 50 BILLION PROBIOT 50+ PO) Take by mouth.     No current facility-administered medications for this visit.   LMP 12/25/2023   PHYSICAL EXAM:  LMP 12/25/2023    Salient findings:  Unable to formally examine given telephone visit  PROCEDURE (Prior, not today):  Prior to initiating any procedures, risks/benefits/alternatives were explained to the patient and verbal consent obtained. Diagnostic Nasal Endoscopy Pre-procedure diagnosis: Nasal obstruction, nasal septal deviation Post-procedure diagnosis: same Indication: See pre-procedure diagnosis and physical exam above Complications: None apparent EBL: 0 mL Anesthesia: Lidocaine  4% and topical decongestant was topically sprayed in each nasal cavity  Description of Procedure:  Patient was identified. A rigid 30 degree endoscope was utilized to evaluate the sinonasal cavities, mucosa, sinus ostia and turbinates and septum.  Overall, signs of mucosal inflammation are not noted.  Also noted are massive left septal deviation.  No mucopurulence, polyps, or masses noted.   Right Middle meatus: clear Right SE Recess: clear Left MM: clear Left SE Recess: clear  CPT CODE -- 31231 - Mod 25   ASSESSMENT:  28 y.o. with:  1. Nasal congestion   2. Nasal obstruction   3. Hypertrophy of both inferior nasal turbinates   4. Nasal septal deviation    Clear structural issue with significant septal deviation and turbinate hypertrophy. Does have h/o allergies but symptoms are perennial. In addition, though opacification on CT, she does not have any significant h/o frequent sinus infections so would observe..  We also discussed that she  could potentially benefit from NV repair but she wishes to avoid any change in appearance of nose so decided to hold off.  Given persistent and worsening sx, we did discuss again septo/turbs.  PLAN: We've discussed issues and options today.    The risks, benefits and alternatives were discussed and questions answered.   We discussed the goals of septoplasty and turbinate reduction, and expectations for postoperative management. Will plan to leave splints in place, and removal was also discussed. We also discussed nasal obstruction post-operatively until splints in place and pain management.  We discussed R/B/A including pain, infection, bleeding (~3% risk of operative visit for control), persistent symptoms, need for revision surgery, and other risks including damage to surrounding structures, septal perforation, and much more rare risk of CSF leak, risks of anesthesia (low), among others.  We discussed use of nasal saline spray and irrigations post-operatively We also discussed use of intranasal steroid post-operatively until healing occurs Patient understands and is ready to proceed.  - Continue flonase daily two sprays - Posted for septo/turbs - f/u POD 5  See below regarding exact medications prescribed this encounter including dosages and route: No orders of the defined types were placed in this encounter.    Thank you for allowing me the opportunity to care for your patient. Please do not  hesitate to contact me should you have any other questions.  Sincerely, Eldora Blanch, MD Otolaryngologist (ENT), [facility] Phone: [phone] Fax: [phone]  MDM: [id number] Complexity/Problems addressed: chronic problem with exacerbation Data complexity: low - Morbidity: mod - decision for surgery  - Prescription Drug prescribed or managed: no  02/05/2024, 4:58 PM

## 2024-02-11 NOTE — Progress Notes (Signed)
 Follow up appointment for results: Management of adenomyosis/dyspareunia/dysmenorrhea suspect endometriosis  Chief Complaint  Patient presents with   Follow-up    Blood pressure 107/73, pulse 80, height 5' 5 (1.651 m), weight 173 lb (78.5 kg), not currently breastfeeding.  Myfembree  is not covered evidently Appears orilissa  is Rx today ^dose 6 months then put on maintenance for 18 months  MEDS ordered this encounter: Meds ordered this encounter  Medications   Elagolix Sodium  (ORILISSA ) 200 MG TABS    Sig: Take 1 tablet (200 mg total) by mouth 2 (two) times daily.    Dispense:  60 tablet    Refill:  11    Orders for this encounter: No orders of the defined types were placed in this encounter.   Impression + Management Plan   ICD-10-CM   1. Adenomyosis: by sonogram  N80.03     2. DUB (dysfunctional uterine bleeding)  N93.8     3. Dyspareunia, female  N94.10      Trial orilissa  for management  Follow Up: Return if symptoms worsen or fail to improve.     All questions were answered.  Past Medical History:  Diagnosis Date   Anemia    as teenager   Anxiety    Breech presentation of fetus 02/15/2023   Depression    Gestational diabetes    first preg   History of postpartum hemorrhage, currently pregnant    History of postpartum hemorrhage, currently pregnant    Mental disorder    anxiety, depression   UTI (urinary tract infection)    Vaginal bleeding in pregnancy, third trimester 01/09/2023    Past Surgical History:  Procedure Laterality Date   COLONOSCOPY N/A 10/06/2023   Procedure: COLONOSCOPY;  Surgeon: Cindie Carlin POUR, DO;  Location: [facility];  Service: Endoscopy;  Laterality: N/A;  900am, asa 2, random colon bx's   ESOPHAGOGASTRODUODENOSCOPY N/A 10/06/2023   Procedure: EGD (ESOPHAGOGASTRODUODENOSCOPY);  Surgeon: Cindie Carlin POUR, DO;  Location: [facility];  Service: Endoscopy;  Laterality: N/A;   MOUTH SURGERY      OB History      Gravida  2   Para  2   Term  2   Preterm  0   AB  0   Living  2      SAB  0   IAB  0   Ectopic  0   Multiple  0   Live Births  2           Allergies  Allergen Reactions   Nickel     Other reaction(s): edema   Other Hives and Swelling    Nickel      Social History   Socioeconomic History   Marital status: Married    Spouse name: Not on file   Number of children: 1   Years of education: Not on file   Highest education level: Professional school degree (e.g., MD, DDS, DVM, JD)  Occupational History   Not on file  Tobacco Use   Smoking status: Never   Smokeless tobacco: Never   Tobacco comments:    Quit 2014  Vaping Use   Vaping status: Former  Substance and Sexual Activity   Alcohol use: Yes    Alcohol/week: 10.0 standard drinks of alcohol    Types: 10 Glasses of wine per week   Drug use: Not Currently    Comment: prior to preg   Sexual activity: Not Currently    Birth control/protection: None, Condom  Other Topics Concern  Not on file  Social History Narrative   Not on file   Social Drivers of Health   Financial Resource Strain: Low Risk  (10/26/2023)   Overall Financial Resource Strain (CARDIA)    Difficulty of Paying Living Expenses: Not hard at all  Food Insecurity: No Food Insecurity (10/26/2023)   Hunger Vital Sign    Worried About Running Out of Food in the Last Year: Never true    Ran Out of Food in the Last Year: Never true  Transportation Needs: No Transportation Needs (10/26/2023)   PRAPARE - Administrator, Civil Service (Medical): No    Lack of Transportation (Non-Medical): No  Physical Activity: Insufficiently Active (10/26/2023)   Exercise Vital Sign    Days of Exercise per Week: 2 days    Minutes of Exercise per Session: 30 min  Stress: Stress Concern Present (10/26/2023)   [employer] of Occupational Health - Occupational Stress Questionnaire    Feeling of Stress: To some extent  Social Connections:  Socially Isolated (10/26/2023)   Social Connection and Isolation Panel    Frequency of Communication with Friends and Family: Once a week    Frequency of Social Gatherings with Friends and Family: Once a week    Attends Religious Services: Never    Database Administrator or Organizations: No    Attends Engineer, Structural: Never    Marital Status: Married    Family History  Problem Relation Age of Onset   Hypertension Mother    Fibroids Mother    Cirrhosis Father    Cancer Maternal Grandmother    Diabetes Maternal Grandfather

## 2024-04-05 ENCOUNTER — Encounter: Payer: Self-pay | Admitting: Obstetrics & Gynecology

## 2024-04-09 ENCOUNTER — Encounter: Payer: Self-pay | Admitting: *Deleted

## 2024-04-19 ENCOUNTER — Encounter: Payer: Self-pay | Admitting: Obstetrics & Gynecology

## 2024-05-14 ENCOUNTER — Telehealth: Payer: Self-pay | Admitting: Obstetrics & Gynecology

## 2024-05-14 MED ORDER — ORILISSA 150 MG PO TABS: 1.0000 | ORAL_TABLET | Freq: Every day | ORAL | 11 refills | Status: AC

## 2024-05-14 NOTE — Telephone Encounter (Signed)
 Asked Dr. Jayne to review mychart messages and reply to patient.

## 2024-05-14 NOTE — Telephone Encounter (Signed)
 Calling to check in on mychart messages. Please advise.

## 2024-06-12 ENCOUNTER — Encounter (INDEPENDENT_AMBULATORY_CARE_PROVIDER_SITE_OTHER): Admitting: Otolaryngology
